# Patient Record
Sex: Male | Born: 1945
Health system: Southern US, Community
[De-identification: ages and names within clinical notes are randomized; demographics above are authoritative.]

## PROBLEM LIST (undated history)

## (undated) DIAGNOSIS — T7840XA Allergy, unspecified, initial encounter: Secondary | ICD-10-CM

## (undated) DIAGNOSIS — M1990 Unspecified osteoarthritis, unspecified site: Secondary | ICD-10-CM

## (undated) DIAGNOSIS — N183 Chronic kidney disease, stage 3 unspecified: Secondary | ICD-10-CM

## (undated) DIAGNOSIS — F32A Depression, unspecified: Secondary | ICD-10-CM

## (undated) DIAGNOSIS — F419 Anxiety disorder, unspecified: Secondary | ICD-10-CM

## (undated) DIAGNOSIS — L209 Atopic dermatitis, unspecified: Secondary | ICD-10-CM

## (undated) DIAGNOSIS — I10 Essential (primary) hypertension: Secondary | ICD-10-CM

## (undated) DIAGNOSIS — M359 Systemic involvement of connective tissue, unspecified: Secondary | ICD-10-CM

## (undated) DIAGNOSIS — H269 Unspecified cataract: Secondary | ICD-10-CM

## (undated) DIAGNOSIS — L309 Dermatitis, unspecified: Secondary | ICD-10-CM

## (undated) DIAGNOSIS — I219 Acute myocardial infarction, unspecified: Secondary | ICD-10-CM

## (undated) DIAGNOSIS — E119 Type 2 diabetes mellitus without complications: Secondary | ICD-10-CM

## (undated) DIAGNOSIS — D72819 Decreased white blood cell count, unspecified: Secondary | ICD-10-CM

## (undated) DIAGNOSIS — E785 Hyperlipidemia, unspecified: Secondary | ICD-10-CM

## (undated) DIAGNOSIS — R5383 Other fatigue: Secondary | ICD-10-CM

## (undated) DIAGNOSIS — R894 Abnormal immunological findings in specimens from other organs, systems and tissues: Secondary | ICD-10-CM

## (undated) DIAGNOSIS — L4050 Arthropathic psoriasis, unspecified: Secondary | ICD-10-CM

## (undated) DIAGNOSIS — B029 Zoster without complications: Secondary | ICD-10-CM

## (undated) DIAGNOSIS — N529 Male erectile dysfunction, unspecified: Secondary | ICD-10-CM

## (undated) DIAGNOSIS — I2510 Atherosclerotic heart disease of native coronary artery without angina pectoris: Secondary | ICD-10-CM

## (undated) DIAGNOSIS — K219 Gastro-esophageal reflux disease without esophagitis: Secondary | ICD-10-CM

## (undated) HISTORY — DX: Depression, unspecified: F32.A

## (undated) HISTORY — DX: Chronic kidney disease, stage 3 unspecified: N18.30

## (undated) HISTORY — DX: Acute myocardial infarction, unspecified: I21.9

## (undated) HISTORY — DX: Atopic dermatitis, unspecified: L20.9

## (undated) HISTORY — PX: COLONOSCOPY: SHX174

## (undated) HISTORY — DX: Zoster without complications: B02.9

## (undated) HISTORY — PX: UPPER GASTROINTESTINAL ENDOSCOPY: SHX188

## (undated) HISTORY — PX: TONSILLECTOMY: SUR1361

## (undated) HISTORY — DX: Type 2 diabetes mellitus without complications: E11.9

## (undated) HISTORY — DX: Other fatigue: R53.83

## (undated) HISTORY — DX: Abnormal immunological findings in specimens from other organs, systems and tissues: R89.4

## (undated) HISTORY — PX: TOOTH EXTRACTION: SUR596

## (undated) HISTORY — DX: Unspecified cataract: H26.9

## (undated) HISTORY — DX: Allergy, unspecified, initial encounter: T78.40XA

## (undated) HISTORY — DX: Essential (primary) hypertension: I10

## (undated) HISTORY — DX: Gastro-esophageal reflux disease without esophagitis: K21.9

## (undated) HISTORY — DX: Unspecified osteoarthritis, unspecified site: M19.90

## (undated) HISTORY — DX: Hyperlipidemia, unspecified: E78.5

## (undated) HISTORY — DX: Dermatitis, unspecified: L30.9

## (undated) HISTORY — DX: Arthropathic psoriasis, unspecified: L40.50

## (undated) HISTORY — DX: Decreased white blood cell count, unspecified: D72.819

## (undated) HISTORY — DX: Atherosclerotic heart disease of native coronary artery without angina pectoris: I25.10

## (undated) HISTORY — DX: Male erectile dysfunction, unspecified: N52.9

## (undated) HISTORY — PX: KNEE ARTHROSCOPY: SUR90

## (undated) HISTORY — DX: Anxiety disorder, unspecified: F41.9

---

## 2000-09-07 ENCOUNTER — Encounter: Payer: Self-pay | Admitting: Emergency Medicine

## 2000-09-07 ENCOUNTER — Emergency Department (HOSPITAL_COMMUNITY): Admission: EM | Admit: 2000-09-07 | Discharge: 2000-09-07 | Payer: Self-pay | Admitting: Emergency Medicine

## 2002-03-11 DIAGNOSIS — I219 Acute myocardial infarction, unspecified: Secondary | ICD-10-CM

## 2002-03-11 HISTORY — DX: Acute myocardial infarction, unspecified: I21.9

## 2002-06-07 ENCOUNTER — Emergency Department (HOSPITAL_COMMUNITY): Admission: EM | Admit: 2002-06-07 | Discharge: 2002-06-07 | Payer: Self-pay | Admitting: Emergency Medicine

## 2002-06-07 ENCOUNTER — Encounter: Payer: Self-pay | Admitting: Emergency Medicine

## 2002-06-09 ENCOUNTER — Inpatient Hospital Stay (HOSPITAL_COMMUNITY): Admission: RE | Admit: 2002-06-09 | Discharge: 2002-06-11 | Payer: Self-pay | Admitting: *Deleted

## 2002-06-13 ENCOUNTER — Inpatient Hospital Stay (HOSPITAL_COMMUNITY): Admission: EM | Admit: 2002-06-13 | Discharge: 2002-06-15 | Payer: Self-pay | Admitting: Emergency Medicine

## 2002-06-13 ENCOUNTER — Encounter: Payer: Self-pay | Admitting: Emergency Medicine

## 2002-06-25 ENCOUNTER — Encounter (HOSPITAL_COMMUNITY): Admission: RE | Admit: 2002-06-25 | Discharge: 2002-07-25 | Payer: Self-pay | Admitting: Cardiology

## 2003-09-13 ENCOUNTER — Encounter: Admission: RE | Admit: 2003-09-13 | Discharge: 2003-09-13 | Payer: Self-pay | Admitting: Family Medicine

## 2004-01-17 ENCOUNTER — Ambulatory Visit: Payer: Self-pay | Admitting: Family Medicine

## 2004-01-19 ENCOUNTER — Ambulatory Visit: Payer: Self-pay | Admitting: Family Medicine

## 2004-02-27 ENCOUNTER — Ambulatory Visit: Payer: Self-pay | Admitting: Family Medicine

## 2004-03-06 ENCOUNTER — Ambulatory Visit: Payer: Self-pay | Admitting: Oncology

## 2004-04-25 ENCOUNTER — Ambulatory Visit: Payer: Self-pay | Admitting: Infectious Diseases

## 2004-04-25 ENCOUNTER — Ambulatory Visit (HOSPITAL_COMMUNITY): Admission: RE | Admit: 2004-04-25 | Discharge: 2004-04-25 | Payer: Self-pay | Admitting: Infectious Diseases

## 2004-05-09 ENCOUNTER — Ambulatory Visit: Payer: Self-pay | Admitting: Infectious Diseases

## 2004-06-08 ENCOUNTER — Ambulatory Visit: Payer: Self-pay | Admitting: Family Medicine

## 2004-07-11 ENCOUNTER — Ambulatory Visit: Payer: Self-pay | Admitting: Infectious Diseases

## 2004-07-11 ENCOUNTER — Ambulatory Visit (HOSPITAL_COMMUNITY): Admission: RE | Admit: 2004-07-11 | Discharge: 2004-07-11 | Payer: Self-pay | Admitting: Infectious Diseases

## 2004-07-16 ENCOUNTER — Ambulatory Visit: Payer: Self-pay | Admitting: Oncology

## 2004-08-01 ENCOUNTER — Ambulatory Visit: Payer: Self-pay | Admitting: Infectious Diseases

## 2004-08-22 ENCOUNTER — Ambulatory Visit: Payer: Self-pay | Admitting: Family Medicine

## 2004-09-25 ENCOUNTER — Ambulatory Visit: Payer: Self-pay

## 2004-10-12 ENCOUNTER — Ambulatory Visit: Payer: Self-pay | Admitting: Cardiology

## 2004-10-12 ENCOUNTER — Ambulatory Visit: Payer: Self-pay | Admitting: Family Medicine

## 2004-10-19 ENCOUNTER — Ambulatory Visit: Payer: Self-pay | Admitting: Family Medicine

## 2004-11-01 ENCOUNTER — Ambulatory Visit: Payer: Self-pay | Admitting: Oncology

## 2005-03-13 ENCOUNTER — Ambulatory Visit: Payer: Self-pay | Admitting: Family Medicine

## 2005-04-22 ENCOUNTER — Ambulatory Visit: Payer: Self-pay | Admitting: Internal Medicine

## 2005-04-23 ENCOUNTER — Encounter (INDEPENDENT_AMBULATORY_CARE_PROVIDER_SITE_OTHER): Payer: Self-pay | Admitting: *Deleted

## 2005-04-23 ENCOUNTER — Ambulatory Visit: Payer: Self-pay | Admitting: Internal Medicine

## 2005-04-23 HISTORY — PX: ESOPHAGOGASTRODUODENOSCOPY: SHX1529

## 2005-05-20 ENCOUNTER — Ambulatory Visit: Payer: Self-pay | Admitting: Family Medicine

## 2005-10-01 ENCOUNTER — Ambulatory Visit: Payer: Self-pay | Admitting: Family Medicine

## 2005-10-08 ENCOUNTER — Ambulatory Visit: Payer: Self-pay | Admitting: Family Medicine

## 2005-10-09 ENCOUNTER — Ambulatory Visit: Payer: Self-pay | Admitting: Cardiology

## 2006-03-11 HISTORY — PX: POLYPECTOMY: SHX149

## 2006-10-03 ENCOUNTER — Ambulatory Visit: Payer: Self-pay | Admitting: Family Medicine

## 2006-10-03 LAB — CONVERTED CEMR LAB
ALT: 23 units/L (ref 0–53)
AST: 19 units/L (ref 0–37)
Albumin: 3.9 g/dL (ref 3.5–5.2)
Alkaline Phosphatase: 58 units/L (ref 39–117)
BUN: 11 mg/dL (ref 6–23)
Basophils Absolute: 0 10*3/uL (ref 0.0–0.1)
Basophils Relative: 0 % (ref 0.0–1.0)
Bilirubin Urine: NEGATIVE
Bilirubin, Direct: 0.1 mg/dL (ref 0.0–0.3)
CO2: 28 meq/L (ref 19–32)
Calcium: 9.2 mg/dL (ref 8.4–10.5)
Chloride: 105 meq/L (ref 96–112)
Cholesterol: 106 mg/dL (ref 0–200)
Creatinine, Ser: 1.1 mg/dL (ref 0.4–1.5)
Eosinophils Absolute: 0.3 10*3/uL (ref 0.0–0.6)
Eosinophils Relative: 7.1 % — ABNORMAL HIGH (ref 0.0–5.0)
GFR calc Af Amer: 88 mL/min
GFR calc non Af Amer: 72 mL/min
Glucose, Bld: 132 mg/dL — ABNORMAL HIGH (ref 70–99)
Glucose, Urine, Semiquant: NEGATIVE
HCT: 40.9 % (ref 39.0–52.0)
HDL: 26.9 mg/dL — ABNORMAL LOW (ref >39.0)
Hemoglobin: 14.6 g/dL (ref 13.0–17.0)
Ketones, urine, test strip: NEGATIVE
LDL Cholesterol: 61 mg/dL (ref 0–99)
Lymphocytes Relative: 11.4 % — ABNORMAL LOW (ref 12.0–46.0)
MCHC: 35.6 g/dL (ref 30.0–36.0)
MCV: 92.5 fL (ref 78.0–100.0)
Monocytes Absolute: 0.4 10*3/uL (ref 0.2–0.7)
Monocytes Relative: 8.9 % (ref 3.0–11.0)
Neutro Abs: 3.4 10*3/uL (ref 1.4–7.7)
Neutrophils Relative %: 72.6 % (ref 43.0–77.0)
Nitrite: NEGATIVE
PSA: 1.5 ng/mL (ref 0.10–4.00)
Platelets: 360 10*3/uL (ref 150–400)
Potassium: 4.2 meq/L (ref 3.5–5.1)
Protein, U semiquant: NEGATIVE
RBC: 4.42 M/uL (ref 4.22–5.81)
RDW: 11.9 % (ref 11.5–14.6)
Sodium: 138 meq/L (ref 135–145)
Specific Gravity, Urine: 1.02
TSH: 1.32 microintl units/mL (ref 0.35–5.50)
Total Bilirubin: 0.6 mg/dL (ref 0.3–1.2)
Total CHOL/HDL Ratio: 3.9
Total Protein: 6 g/dL (ref 6.0–8.3)
Triglycerides: 92 mg/dL (ref 0–149)
Urobilinogen, UA: 0.2
VLDL: 18 mg/dL (ref 0–40)
WBC Urine, dipstick: NEGATIVE
WBC: 4.6 10*3/uL (ref 4.5–10.5)
pH: 6

## 2006-10-06 DIAGNOSIS — E1139 Type 2 diabetes mellitus with other diabetic ophthalmic complication: Secondary | ICD-10-CM | POA: Insufficient documentation

## 2006-10-06 DIAGNOSIS — I152 Hypertension secondary to endocrine disorders: Secondary | ICD-10-CM | POA: Insufficient documentation

## 2006-10-06 DIAGNOSIS — E785 Hyperlipidemia, unspecified: Secondary | ICD-10-CM | POA: Insufficient documentation

## 2006-10-06 DIAGNOSIS — I10 Essential (primary) hypertension: Secondary | ICD-10-CM | POA: Insufficient documentation

## 2006-10-06 DIAGNOSIS — F411 Generalized anxiety disorder: Secondary | ICD-10-CM | POA: Insufficient documentation

## 2006-10-06 DIAGNOSIS — E1169 Type 2 diabetes mellitus with other specified complication: Secondary | ICD-10-CM | POA: Insufficient documentation

## 2006-10-06 DIAGNOSIS — I252 Old myocardial infarction: Secondary | ICD-10-CM | POA: Insufficient documentation

## 2006-10-06 DIAGNOSIS — I2510 Atherosclerotic heart disease of native coronary artery without angina pectoris: Secondary | ICD-10-CM | POA: Insufficient documentation

## 2006-10-10 ENCOUNTER — Telehealth: Payer: Self-pay | Admitting: Family Medicine

## 2006-10-21 ENCOUNTER — Encounter: Payer: Self-pay | Admitting: Family Medicine

## 2006-10-21 ENCOUNTER — Ambulatory Visit: Payer: Self-pay

## 2006-10-28 ENCOUNTER — Ambulatory Visit: Payer: Self-pay | Admitting: Cardiology

## 2006-11-24 ENCOUNTER — Ambulatory Visit: Payer: Self-pay | Admitting: Family Medicine

## 2006-11-24 DIAGNOSIS — L2089 Other atopic dermatitis: Secondary | ICD-10-CM | POA: Insufficient documentation

## 2006-11-24 DIAGNOSIS — J309 Allergic rhinitis, unspecified: Secondary | ICD-10-CM | POA: Insufficient documentation

## 2006-11-24 DIAGNOSIS — D72819 Decreased white blood cell count, unspecified: Secondary | ICD-10-CM | POA: Insufficient documentation

## 2007-10-28 ENCOUNTER — Ambulatory Visit: Payer: Self-pay | Admitting: Cardiology

## 2007-11-17 ENCOUNTER — Ambulatory Visit: Payer: Self-pay | Admitting: Family Medicine

## 2007-11-17 LAB — CONVERTED CEMR LAB
ALT: 25 units/L (ref 0–53)
AST: 22 units/L (ref 0–37)
Basophils Absolute: 0 10*3/uL (ref 0.0–0.1)
Basophils Relative: 0.1 % (ref 0.0–3.0)
Bilirubin Urine: NEGATIVE
Bilirubin, Direct: 0.1 mg/dL (ref 0.0–0.3)
Blood in Urine, dipstick: NEGATIVE
CO2: 28 meq/L (ref 19–32)
Chloride: 106 meq/L (ref 96–112)
Cholesterol: 117 mg/dL (ref 0–200)
Creatinine, Ser: 1.1 mg/dL (ref 0.4–1.5)
Eosinophils Absolute: 0.3 10*3/uL (ref 0.0–0.7)
GFR calc non Af Amer: 72 mL/min
Glucose, Urine, Semiquant: NEGATIVE
HDL: 31.5 mg/dL — ABNORMAL LOW (ref >39.0)
Ketones, urine, test strip: NEGATIVE
LDL Cholesterol: 68 mg/dL (ref 0–99)
Lymphocytes Relative: 12.5 % (ref 12.0–46.0)
MCHC: 35 g/dL (ref 30.0–36.0)
MCV: 95.6 fL (ref 78.0–100.0)
Neutrophils Relative %: 71.9 % (ref 43.0–77.0)
Nitrite: NEGATIVE
PSA: 1.26 ng/mL (ref 0.10–4.00)
Platelets: 392 10*3/uL (ref 150–400)
Protein, U semiquant: NEGATIVE
RDW: 11.8 % (ref 11.5–14.6)
Sodium: 139 meq/L (ref 135–145)
Specific Gravity, Urine: 1.02
TSH: 1.53 microintl units/mL (ref 0.35–5.50)
Total Bilirubin: 1 mg/dL (ref 0.3–1.2)
Triglycerides: 86 mg/dL (ref 0–149)
Urobilinogen, UA: 0.2
VLDL: 17 mg/dL (ref 0–40)
WBC Urine, dipstick: NEGATIVE
pH: 6

## 2007-11-26 ENCOUNTER — Ambulatory Visit: Payer: Self-pay | Admitting: Family Medicine

## 2007-11-26 DIAGNOSIS — M1990 Unspecified osteoarthritis, unspecified site: Secondary | ICD-10-CM | POA: Insufficient documentation

## 2007-11-26 DIAGNOSIS — F528 Other sexual dysfunction not due to a substance or known physiological condition: Secondary | ICD-10-CM | POA: Insufficient documentation

## 2007-11-30 ENCOUNTER — Telehealth: Payer: Self-pay | Admitting: Family Medicine

## 2008-06-28 ENCOUNTER — Ambulatory Visit: Payer: Self-pay | Admitting: Internal Medicine

## 2008-07-14 ENCOUNTER — Ambulatory Visit: Payer: Self-pay | Admitting: Internal Medicine

## 2008-08-29 ENCOUNTER — Telehealth: Payer: Self-pay | Admitting: Family Medicine

## 2008-09-08 ENCOUNTER — Telehealth: Payer: Self-pay | Admitting: Family Medicine

## 2008-09-09 ENCOUNTER — Telehealth: Payer: Self-pay | Admitting: *Deleted

## 2008-11-09 ENCOUNTER — Ambulatory Visit: Payer: Self-pay | Admitting: Family Medicine

## 2008-11-09 LAB — CONVERTED CEMR LAB
BUN: 17 mg/dL (ref 6–23)
Basophils Absolute: 0 10*3/uL (ref 0.0–0.1)
Bilirubin Urine: NEGATIVE
Bilirubin, Direct: 0.1 mg/dL (ref 0.0–0.3)
CO2: 27 meq/L (ref 19–32)
Calcium: 9.4 mg/dL (ref 8.4–10.5)
Chloride: 104 meq/L (ref 96–112)
Cholesterol: 108 mg/dL (ref 0–200)
Creatinine, Ser: 1 mg/dL (ref 0.4–1.5)
Eosinophils Absolute: 0.3 10*3/uL (ref 0.0–0.7)
HDL: 30.5 mg/dL — ABNORMAL LOW (ref >39.00)
LDL Cholesterol: 62 mg/dL (ref 0–99)
Leukocytes, UA: NEGATIVE
Lymphocytes Relative: 11.4 % — ABNORMAL LOW (ref 12.0–46.0)
MCHC: 35.5 g/dL (ref 30.0–36.0)
MCV: 93.4 fL (ref 78.0–100.0)
Monocytes Absolute: 0.5 10*3/uL (ref 0.1–1.0)
Neutrophils Relative %: 74.6 % (ref 43.0–77.0)
Nitrite: NEGATIVE
PSA: 1.5 ng/mL (ref 0.10–4.00)
Platelets: 435 10*3/uL — ABNORMAL HIGH (ref 150.0–400.0)
RDW: 11.8 % (ref 11.5–14.6)
Specific Gravity, Urine: 1.02 (ref 1.000–1.030)
Total Bilirubin: 0.7 mg/dL (ref 0.3–1.2)
Total Protein: 7.1 g/dL (ref 6.0–8.3)
Triglycerides: 79 mg/dL (ref 0.0–149.0)
pH: 6 (ref 5.0–8.0)

## 2008-11-29 ENCOUNTER — Ambulatory Visit: Payer: Self-pay | Admitting: Family Medicine

## 2008-11-30 DIAGNOSIS — R5381 Other malaise: Secondary | ICD-10-CM | POA: Insufficient documentation

## 2008-11-30 DIAGNOSIS — R5383 Other fatigue: Secondary | ICD-10-CM

## 2008-12-07 ENCOUNTER — Ambulatory Visit: Payer: Self-pay | Admitting: Cardiology

## 2009-01-03 ENCOUNTER — Ambulatory Visit: Payer: Self-pay | Admitting: Family Medicine

## 2009-01-03 LAB — CONVERTED CEMR LAB
Bilirubin Urine: NEGATIVE
Chloride: 104 meq/L (ref 96–112)
Creatinine, Ser: 1.2 mg/dL (ref 0.4–1.5)
Hemoglobin, Urine: NEGATIVE
Hgb A1c MFr Bld: 6 % (ref 4.6–6.5)
Leukocytes, UA: NEGATIVE
Nitrite: NEGATIVE
Sodium: 136 meq/L (ref 135–145)
pH: 6 (ref 5.0–8.0)

## 2009-01-11 ENCOUNTER — Ambulatory Visit: Payer: Self-pay | Admitting: Family Medicine

## 2009-04-05 ENCOUNTER — Ambulatory Visit: Payer: Self-pay | Admitting: Family Medicine

## 2009-04-05 LAB — CONVERTED CEMR LAB
BUN: 25 mg/dL — ABNORMAL HIGH (ref 6–23)
Calcium: 9.3 mg/dL (ref 8.4–10.5)
GFR calc non Af Amer: 79.93 mL/min (ref >60)
Glucose, Bld: 128 mg/dL — ABNORMAL HIGH (ref 70–99)
Sodium: 139 meq/L (ref 135–145)

## 2009-04-12 ENCOUNTER — Ambulatory Visit: Payer: Self-pay | Admitting: Family Medicine

## 2009-08-14 ENCOUNTER — Encounter: Payer: Self-pay | Admitting: Family Medicine

## 2009-11-07 ENCOUNTER — Ambulatory Visit: Payer: Self-pay | Admitting: Family Medicine

## 2009-11-07 ENCOUNTER — Telehealth: Payer: Self-pay | Admitting: Family Medicine

## 2009-11-20 ENCOUNTER — Ambulatory Visit: Payer: Self-pay | Admitting: Family Medicine

## 2009-12-07 ENCOUNTER — Ambulatory Visit: Payer: Self-pay | Admitting: Family Medicine

## 2009-12-07 LAB — CONVERTED CEMR LAB
AST: 25 units/L (ref 0–37)
Alkaline Phosphatase: 45 units/L (ref 39–117)
BUN: 26 mg/dL — ABNORMAL HIGH (ref 6–23)
Basophils Absolute: 0 10*3/uL (ref 0.0–0.1)
Bilirubin Urine: NEGATIVE
Calcium: 9.4 mg/dL (ref 8.4–10.5)
GFR calc non Af Amer: 62.81 mL/min (ref >60)
Glucose, Bld: 124 mg/dL — ABNORMAL HIGH (ref 70–99)
HDL: 34.7 mg/dL — ABNORMAL LOW (ref >39.00)
Hemoglobin, Urine: NEGATIVE
Ketones, ur: NEGATIVE mg/dL
Lymphocytes Relative: 10.2 % — ABNORMAL LOW (ref 12.0–46.0)
Lymphs Abs: 0.5 10*3/uL — ABNORMAL LOW (ref 0.7–4.0)
Monocytes Relative: 8.1 % (ref 3.0–12.0)
Nitrite: NEGATIVE
Platelets: 528 10*3/uL — ABNORMAL HIGH (ref 150.0–400.0)
RDW: 13.5 % (ref 11.5–14.6)
TSH: 1.06 microintl units/mL (ref 0.35–5.50)
Total Bilirubin: 0.8 mg/dL (ref 0.3–1.2)
Total Protein, Urine: NEGATIVE mg/dL
VLDL: 9.6 mg/dL (ref 0.0–40.0)

## 2009-12-14 ENCOUNTER — Encounter: Payer: Self-pay | Admitting: Cardiology

## 2009-12-14 ENCOUNTER — Ambulatory Visit: Payer: Self-pay | Admitting: Family Medicine

## 2009-12-14 ENCOUNTER — Encounter: Payer: Self-pay | Admitting: Family Medicine

## 2009-12-14 DIAGNOSIS — Z87438 Personal history of other diseases of male genital organs: Secondary | ICD-10-CM | POA: Insufficient documentation

## 2009-12-21 ENCOUNTER — Ambulatory Visit: Payer: Self-pay | Admitting: Cardiology

## 2009-12-25 ENCOUNTER — Telehealth (INDEPENDENT_AMBULATORY_CARE_PROVIDER_SITE_OTHER): Payer: Self-pay | Admitting: Radiology

## 2009-12-26 ENCOUNTER — Encounter: Payer: Self-pay | Admitting: Internal Medicine

## 2009-12-26 ENCOUNTER — Encounter (HOSPITAL_COMMUNITY)
Admission: RE | Admit: 2009-12-26 | Discharge: 2010-03-10 | Payer: Self-pay | Source: Home / Self Care | Attending: Cardiology | Admitting: Cardiology

## 2009-12-26 ENCOUNTER — Ambulatory Visit: Payer: Self-pay

## 2009-12-26 ENCOUNTER — Ambulatory Visit: Payer: Self-pay | Admitting: Internal Medicine

## 2010-01-12 ENCOUNTER — Ambulatory Visit: Payer: Self-pay | Admitting: Family Medicine

## 2010-04-10 NOTE — Assessment & Plan Note (Signed)
Summary: ROA X 3 MTHS / RS   Vital Signs:  Patient profile:   65 year old male Weight:      180 pounds Temp:     98.7 degrees F oral BP sitting:   120 / 80  (left arm) Cuff size:   regular  Vitals Entered By: Kern Reap CMA Duncan Dull) (April 12, 2009 8:32 AM)  Reason for Visit follow up office visit   Primary Care Provider:  Roderick Pee MD   History of Present Illness: Chase Anderson is a 65 year old, married male, nonsmoker, who comes in today for evaluation of 3 problems.  He has underlying type 2 diabetes, however, he's been able to control his blood sugar with diet and exercise.  His current weight is 180.  He exercises on a regular basis and avoids carbohydrates.  Fasting blood sugar 128 with a hemoglobin A1c of 6.0, which represents excellent control without medication.  Is osteoarthritis is flared up again.  Dr. wall.  Advised him to stop the Motrin.  He switch to Aleve one b.i.d. however, his arthritis got worse.  In the past.  We tried indomethacin 50 mg daily, however, they gave him severe abdominal pain, and nausea.  His eczema has flared up on his hands.  He uses a combination of triamcinolone with moisturizing cream.  But it doesn't seem to help.  In the past week, given a short course of prednisone however, since his joint pain is worse.  We discussed the various options.  Will get a consult from Dr. Dareen Piano rheumatology to see if there is something else we might do to help with his joint pain.  Allergies: 1)  ! Pcn 2)  ! Codeine 3)  ! * Rubber  Past History:  Past medical, surgical, family and social histories (including risk factors) reviewed for relevance to current acute and chronic problems.  Past Medical History: Reviewed history from 11/30/2008 and no changes required. MYOCARDIAL INFARCTION, HX OF (ICD-412) CORONARY ARTERY DISEASE (ICD-414.00) HYPERLIPIDEMIA (ICD-272.4) HYPERTENSION (ICD-401.9) FATIGUE, CHRONIC (ICD-780.79) ANXIETY  (ICD-300.00) DIABETES MELLITUS, TYPE II (ICD-250.00) OSTEOARTHRITIS (ICD-715.90) ERECTILE DYSFUNCTION (ICD-302.72) LEUKOPENIA, CHRONIC (ICD-288.50) DERMATITIS, OTHER ATOPIC (ICD-691.8) ALLERGIC RHINITIS (ICD-477.9)    Past Surgical History: Reviewed history from 11/30/2008 and no changes required. EGD-04/23/2005 Tonsillectomy Knee Arthroscopy (right)  Family History: Reviewed history from 10/06/2006 and no changes required. Family History Hypertension Fam hx COPD Fam hx CHF Fam hx Anxiety  Social History: Reviewed history from 11/30/2008 and no changes required. Married Former Smoker..quit 10/03..2ppd Alcohol use-no Drug use-no Regular exercise-yes  Review of Systems      See HPI  Physical Exam  General:  Well-developed,well-nourished,in no acute distress; alert,appropriate and cooperative throughout examination Skin:  2+ eczema involving the hands.  No cracks or fissures   Impression & Recommendations:  Problem # 1:  DIABETES MELLITUS, TYPE II (ICD-250.00) Assessment Improved  His updated medication list for this problem includes:    Aspirin 325 Mg Tabs (Aspirin) .Marland Kitchen... Take once daily  Orders: Prescription Created Electronically [phone])  Problem # 2:  OSTEOARTHRITIS (ICD-715.90) Assessment: Deteriorated  His updated medication list for this problem includes:    Aspirin 325 Mg Tabs (Aspirin) .Marland Kitchen... Take once daily    Aleve 220 Mg Tabs (Naproxen sodium) .Marland Kitchen... Take 2 tabs  two times a day    Darvocet-n 100 100-650 Mg Tabs (Propoxyphene n-apap) .Marland Kitchen... 1 or 2 three times a day as needed pain  Orders: Prescription Created Electronically [phone]) Rheumatology Referral (Rheumatology)  Problem # 3:  DERMATITIS, OTHER ATOPIC (ICD-691.8) Assessment: Unchanged  His updated medication list for this problem includes:    Triamcinolone Acetonide 0.1 % Crea (Triamcinolone acetonide) .Marland Kitchen... As needed    Fluocinonide 0.05 % Oint (Fluocinonide) .Marland Kitchen... Apply once daily as  needed  Orders: Prescription Created Electronically [phone])  Complete Medication List: 1)  Simvastatin 20 Mg Tabs (Simvastatin) .Marland Kitchen.. 1 at bedtime 2)  Fluconazole 200 Mg Tabs (Fluconazole) .... As needed 3)  Norvasc 10 Mg Tabs (Amlodipine besylate) .... Take 1 tablet by mouth every morning 4)  Bactrim Ds 800-160 Mg Tabs (Sulfamethoxazole-trimethoprim) .... One once daily prn 5)  Triamcinolone Acetonide 0.1 % Crea (Triamcinolone acetonide) .... As needed 6)  Nitroquick 0.3 Mg Subl (Nitroglycerin) .... Take one tab under tongue as needed 7)  Cleocin 300 Mg Caps (Clindamycin hcl) .... As needed 8)  Fluocinonide 0.05 % Oint (Fluocinonide) .... Apply once daily as needed 9)  Niacin 500 Mg Tabs (Niacin) .... Take one tab once daily 10)  Aspirin 325 Mg Tabs (Aspirin) .... Take once daily 11)  Ciprofloxacin Hcl 500 Mg Tabs (Ciprofloxacin hcl) .... Take 1 tablet by mouth two times a day 12)  Levitra 20 Mg Tabs (Vardenafil hcl) .... Uad 13)  Aleve 220 Mg Tabs (Naproxen sodium) .... Take 2 tabs  two times a day 14)  Darvocet-n 100 100-650 Mg Tabs (Propoxyphene n-apap) .Marland Kitchen.. 1 or 2 three times a day as needed pain  Patient Instructions: 1)  See your eye doctor yearly to check for diabetic eye damage. 2)  call Dr. Dareen Piano at [phone] be sure to mention that I referred to.  Have his nurse call Fleet Contras my nurse, and we will forward a copy of all your medical records

## 2010-04-10 NOTE — Assessment & Plan Note (Signed)
Summary: 2 WEEKS FUP//CCM---PT [facility] // RS   Vital Signs:  Patient profile:   65 year old male Weight:      182 pounds Temp:     98.1 degrees F oral BP sitting:   110 / 68  (right arm) Cuff size:   regular  Vitals Entered By: Kathrynn Speed CMA (November 20, 2009 1:38 PM) CC: 2 wk fu BS, has shinges, src Is Patient Diabetic? Yes   Primary Care Provider:  Roderick Pee MD  CC:  2 wk fu BS, has shinges, and src.  History of Present Illness: Chase Anderson is a 65 year old male, who comes in today for follow-up of diabetes, and a new problem of shingles.  We put him on metformin 500 mg b.i.d. on August 30.  Fasting blood sugar now in the 110, 120 range.  No hypoglycemia.  He is also on a strict diet and walks daily.  He finished his prednisone about 6 days ago.  Blood sugar has not dropped since he stopped the prednisone.  He also states he says shingles.  This is his third episode.  The first was his face.  The second right leg now.  His right chest.  It's been present for about two weeks.  He does have the issue with underlying leukopenia etiology ?  Preventive Screening-Counseling & Management  Alcohol-Tobacco     Smoking Status: quit  Current Medications (verified): 1)  Simvastatin 20 Mg  Tabs (Simvastatin) .Marland Kitchen.. 1 At Bedtime 2)  Fluconazole 200 Mg Tabs (Fluconazole) .... As Needed 3)  Norvasc 10 Mg  Tabs (Amlodipine Besylate) .... Take 1 Tablet By Mouth Every Morning 4)  Bactrim Ds 800-160 Mg  Tabs (Sulfamethoxazole-Trimethoprim) .... One Once Daily Prn 5)  Triamcinolone Acetonide 0.1 % Crea (Triamcinolone Acetonide) .... As Needed 6)  Nitroquick 0.3 Mg Subl (Nitroglycerin) .... Take One Tab Under Tongue As Needed 7)  Fluocinonide 0.05 % Oint (Fluocinonide) .... Apply Once Daily As Needed 8)  Niacin 500 Mg Tabs (Niacin) .... Take One Tab Once Daily 9)  Aspirin 325 Mg Tabs (Aspirin) .... Take Once Daily 10)  Levitra 20 Mg Tabs (Vardenafil Hcl) .... Uad 11)  Methotrexate 2.5 Mg  Tabs (Methotrexate Sodium) .... Take 6 Tabs Once Weekly 12)  Folic Acid 1 Mg Tabs (Folic Acid) .... Once Daily 13)  Metformin Hcl 500 Mg Tabs (Metformin Hcl) .... Take 1 Tablet By Mouth Two Times A Day 14)  Bd Ultra-Fine Lancets  Misc (Lancets) .... Use Once Daily For Glucose Control  Allergies (verified): 1)  ! Pcn 2)  ! Codeine 3)  ! * Rubber  Social History: Reviewed history from 11/30/2008 and no changes required. Married Former Smoker..quit 10/03..2ppd Alcohol use-no Drug use-no Regular exercise-yes  Review of Systems      See HPI  Physical Exam  General:  Well-developed,well-nourished,in no acute distress; alert,appropriate and cooperative throughout examination Skin:  diffuse vesicular rash on right chest wall, extending to the right back   Problems:  Medical Problems Added: 1)  Dx of Herpes Zoster Nos  (ICD-053.9)  Impression & Recommendations:  Problem # 1:  HERPES ZOSTER NOS (ICD-053.9) Assessment New  Problem # 2:  DIABETES MELLITUS, TYPE II (ICD-250.00) Assessment: Improved  His updated medication list for this problem includes:    Aspirin 325 Mg Tabs (Aspirin) .Marland Kitchen... Take once daily    Metformin Hcl 500 Mg Tabs (Metformin hcl) .Marland Kitchen... Take 1 tablet by mouth two times a day  Complete Medication List: 1)  Simvastatin 20  Mg Tabs (Simvastatin) .Marland Kitchen.. 1 at bedtime 2)  Fluconazole 200 Mg Tabs (Fluconazole) .... As needed 3)  Norvasc 10 Mg Tabs (Amlodipine besylate) .... Take 1 tablet by mouth every morning 4)  Bactrim Ds 800-160 Mg Tabs (Sulfamethoxazole-trimethoprim) .... One once daily prn 5)  Triamcinolone Acetonide 0.1 % Crea (Triamcinolone acetonide) .... As needed 6)  Nitroquick 0.3 Mg Subl (Nitroglycerin) .... Take one tab under tongue as needed 7)  Fluocinonide 0.05 % Oint (Fluocinonide) .... Apply once daily as needed 8)  Niacin 500 Mg Tabs (Niacin) .... Take one tab once daily 9)  Aspirin 325 Mg Tabs (Aspirin) .... Take once daily 10)  Levitra 20 Mg  Tabs (Vardenafil hcl) .... Uad 11)  Methotrexate 2.5 Mg Tabs (Methotrexate sodium) .... Take 6 tabs once weekly 12)  Folic Acid 1 Mg Tabs (Folic acid) .... Once daily 13)  Metformin Hcl 500 Mg Tabs (Metformin hcl) .... Take 1 tablet by mouth two times a day 14)  Bd Ultra-fine Lancets Misc (Lancets) .... Use once daily for glucose control 15)  Accu-chek Aviva Strp (Glucose blood) .... Use once daily for glucose readings 16)  Zovirax 400 Mg Tabs (Acyclovir) .... Take 1 tablet by mouth three times a day  Other Orders: Prescription Created Electronically [id number])  Patient Instructions: 1)  continue the metformin 500 mg twice daily and check a fasting blood sugar daily in the morning.  Return in 3 months for follow-up 2)  BMP prior to visit, ICD-9: 3)  HbgA1C prior to visit, ICD-9:.....250.00 4)  Begin acyclovir 600 mg 3 times a day until the rash is gone Prescriptions: ZOVIRAX 400 MG TABS (ACYCLOVIR) Take 1 tablet by mouth three times a day  #50 x 1   Entered and Authorized by:   Roderick Pee MD   Signed by:   Roderick Pee MD on 11/20/2009   Method used:   Electronically to        [facility] [address] [phone]* (retail)       [address] [place], [place]  [address]       Ph: [phone]       Fax: [phone]   RxID:   [id number] ACCU-CHEK AVIVA  STRP (GLUCOSE BLOOD) use once daily for glucose readings  #100 x 3   Entered by:   Kern Reap CMA (AAMA)   Authorized by:   Roderick Pee MD   Signed by:   Kern Reap CMA (AAMA) on 11/20/2009   Method used:   Electronically to        [facility] [phone]* (retail)       [address] [place]  [address]       Ph: [phone]       Fax: [phone]   RxID:   [id number]

## 2010-04-10 NOTE — Miscellaneous (Signed)
Summary: dm eye exam  Clinical Lists Changes  Observations: Added new observation of EYES COMMENT: 08/2010 (08/14/2009 12:45) Added new observation of EYE EXAM BY: cotter (08/14/2009 12:45) Added new observation of DMEYEEXMRES: mild retinopathy (08/14/2009 12:45) Added new observation of DIAB EYE EX: mild retinopathy (08/14/2009 12:45)       Diabetes Management History:      He says that he is exercising.    Diabetes Management Exam:    Eye Exam:       Eye Exam done elsewhere          Date: 08/14/2009          Results: mild retinopathy          Done by: cotter

## 2010-04-10 NOTE — Progress Notes (Signed)
Summary: NUC PRE-PROCEDURE  Phone Note Outgoing Call   Call placed by: Domenic Polite, CNMT,  December 25, 2009 10:04 AM Call placed to: Patient Summary of Call: Reviewed information on My[employer] Information Sheet (see scanned document for further details).  Spoke with patient.  Initial call taken by: Domenic Polite, CNMT,  December 25, 2009 10:04 AM     Nuclear Med Background Indications for Stress Test: Evaluation for Ischemia, Abnormal EKG   History: Abnormal EKG, Heart Catheterization, Myocardial Infarction, Myocardial Perfusion Study  History Comments: '04 heart cath - INF. MI , '08 MPI - IFEROSEPTAL THINING  Symptoms: Fatigue    Nuclear Pre-Procedure Cardiac Risk Factors: History of Smoking, Hypertension, Lipids, NIDDM Height (in): 70.5

## 2010-04-10 NOTE — Assessment & Plan Note (Signed)
Summary: elevated glucose - rv   Vital Signs:  Patient profile:   65 year old male Height:      70.5 inches Weight:      180 pounds BMI:     25.55 Temp:     98.1 degrees F oral BP sitting:   120 / 74  (left arm) Cuff size:   regular  Vitals Entered By: Kern Reap CMA Duncan Dull) (November 07, 2009 2:14 PM) CC: follow-up visit   Primary Care Provider:  Roderick Pee MD  CC:  follow-up visit.  History of Present Illness: densel is a 65 year old male, who has been diagnosed by his rheumatologist to have rheumatoid arthritis.  Is currently on methotrexate 2.5-mg tabs 6 or week and a prednisone 2.5 mg b.i.d..  Lab work the other day in the rheumatologist office showed random blood sugar to be 400.  Blood sugar today 223.  He said no fever, chills, weight loss.  He said a history of glucose intolerance in the past, however, he's been able to control with diet and exercise.  , blood sugar here January  2011 was 128  with an A1c of 6.0%  Allergies: 1)  ! Pcn 2)  ! Codeine 3)  ! * Rubber  Past History:  Past medical, surgical, family and social histories (including risk factors) reviewed for relevance to current acute and chronic problems.  Past Medical History: Reviewed history from 11/30/2008 and no changes required. MYOCARDIAL INFARCTION, HX OF (ICD-412) CORONARY ARTERY DISEASE (ICD-414.00) HYPERLIPIDEMIA (ICD-272.4) HYPERTENSION (ICD-401.9) FATIGUE, CHRONIC (ICD-780.79) ANXIETY (ICD-300.00) DIABETES MELLITUS, TYPE II (ICD-250.00) OSTEOARTHRITIS (ICD-715.90) ERECTILE DYSFUNCTION (ICD-302.72) LEUKOPENIA, CHRONIC (ICD-288.50) DERMATITIS, OTHER ATOPIC (ICD-691.8) ALLERGIC RHINITIS (ICD-477.9)    Past Surgical History: Reviewed history from 11/30/2008 and no changes required. EGD-04/23/2005 Tonsillectomy Knee Arthroscopy (right)  Family History: Reviewed history from 10/06/2006 and no changes required. Family History Hypertension Fam hx COPD Fam hx CHF Fam hx  Anxiety  Social History: Reviewed history from 11/30/2008 and no changes required. Married Former Smoker..quit 10/03..2ppd Alcohol use-no Drug use-no Regular exercise-yes  Review of Systems      See HPI  Physical Exam  General:  Well-developed,well-nourished,in no acute distress; alert,appropriate and cooperative throughout examination   Impression & Recommendations:  Problem # 1:  DIABETES MELLITUS, TYPE II (ICD-250.00) Assessment Deteriorated  His updated medication list for this problem includes:    Aspirin 325 Mg Tabs (Aspirin) .Marland Kitchen... Take once daily    Metformin Hcl 500 Mg Tabs (Metformin hcl) .Marland Kitchen... Take 1 tablet by mouth two times a day  Orders: Glucose, (CBG) ([address])  Complete Medication List: 1)  Simvastatin 20 Mg Tabs (Simvastatin) .Marland Kitchen.. 1 at bedtime 2)  Fluconazole 200 Mg Tabs (Fluconazole) .... As needed 3)  Norvasc 10 Mg Tabs (Amlodipine besylate) .... Take 1 tablet by mouth every morning 4)  Bactrim Ds 800-160 Mg Tabs (Sulfamethoxazole-trimethoprim) .... One once daily prn 5)  Triamcinolone Acetonide 0.1 % Crea (Triamcinolone acetonide) .... As needed 6)  Nitroquick 0.3 Mg Subl (Nitroglycerin) .... Take one tab under tongue as needed 7)  Fluocinonide 0.05 % Oint (Fluocinonide) .... Apply once daily as needed 8)  Niacin 500 Mg Tabs (Niacin) .... Take one tab once daily 9)  Aspirin 325 Mg Tabs (Aspirin) .... Take once daily 10)  Levitra 20 Mg Tabs (Vardenafil hcl) .... Uad 11)  Methotrexate 2.5 Mg Tabs (Methotrexate sodium) .... Take 6 tabs once weekly 12)  Prednisone 5 Mg Tabs (Prednisone) .... Use as directed 13)  Folic Acid 1 Mg Tabs (  Folic acid) .... Once daily 14)  Metformin Hcl 500 Mg Tabs (Metformin hcl) .... Take 1 tablet by mouth two times a day 15)  Bd Ultra-fine Lancets Misc (Lancets) .... Use once daily for glucose control  Patient Instructions: 1)  check a fasting blood sugar daily in the morning and begin Glucophage 500 mg before breakfast and  500 mg before evening meal.  If your blood sugar drops below 60 call.........Marland Kitchen return in two weeks for follow-up with the data Prescriptions: BD ULTRA-FINE LANCETS  MISC (LANCETS) use once daily for glucose control  #100 x 3   Entered by:   Kern Reap CMA (AAMA)   Authorized by:   Roderick Pee MD   Signed by:   Kern Reap CMA (AAMA) on 11/07/2009   Method used:   Electronically to        [facility] [address] [phone]* (retail)       [address] [place], [place]  [address]       Ph: [phone]       Fax: [phone]   RxID:   [id number] METFORMIN HCL 500 MG TABS (METFORMIN HCL) Take 1 tablet by mouth two times a day  #200 x 3   Entered and Authorized by:   Roderick Pee MD   Signed by:   Roderick Pee MD on 11/07/2009   Method used:   Electronically to        [facility] [address] [phone]* (retail)       [address], [place]  [address]       Ph: [phone]       Fax: [phone]   RxID:   [id number]

## 2010-04-10 NOTE — Assessment & Plan Note (Signed)
Summary: Cardiology Nuclear Testing  Nuclear Med Background Indications for Stress Test: Evaluation for Ischemia, Abnormal EKG   History: Abnormal EKG, Heart Catheterization, Myocardial Infarction, Myocardial Perfusion Study  History Comments: '04 heart cath - INF. MI , '08 MPI - IFEROSEPTAL THINING  Symptoms: Chest Tightness, Fatigue, Palpitations    Nuclear Pre-Procedure Cardiac Risk Factors: Family History - CAD, History of Smoking, Hypertension, Lipids, NIDDM Caffeine/Decaff Intake: none NPO After: 7:30 AM Lungs: clear IV 0.9% NS with Angio Cath: 22g     IV Site: R Hand IV Started by: Cathlyn Parsons, RN Chest Size (in) 44     Height (in): 70.5 Weight (lb): 178 BMI: 25.27  Nuclear Med Study 1 or 2 day study:  1 day     Stress Test Type:  Stress Reading MD:  Arvilla Meres, MD     Referring MD:  Wall Resting Radionuclide:  Technetium 88m Tetrofosmin     Resting Radionuclide Dose:  10.8 mCi  Stress Radionuclide:  Technetium 20m Tetrofosmin     Stress Radionuclide Dose:  33 mCi   Stress Protocol Exercise Time (min):  7:15 min     Max HR:  134 bpm     Predicted Max HR:  156 bpm  Max Systolic BP: 170 mm Hg     Percent Max HR:  85.90 %     METS: 8.9 Rate Pressure Product:  95638    Stress Test Technologist:  Milana Na, EMT-P     Nuclear Technologist:  Domenic Polite, CNMT  Rest Procedure  Myocardial perfusion imaging was performed at rest 45 minutes following the intravenous administration of Technetium 74m Tetrofosmin.  Stress Procedure  The patient exercised for 7:15. The patient stopped due to fatigue and denied any chest pain.  There were no significant ST-T wave changes and rare pvcs.  Technetium 2m Tetrofosmin was injected at peak exercise and myocardial perfusion imaging was performed after a brief delay.  QPS Raw Data Images:  Normal; no motion artifact; normal heart/lung ratio. Stress Images:  Mildly decreased uptake in the inferior and inferospetal  walls. Rest Images:  Mildly decreased uptake in the inferior and inferospetal walls. Subtraction (SDS):  Previous mild inferior and inferoseptal infarct. No ischemia. Transient Ischemic Dilatation:  1.07  (Normal <1.22)  Lung/Heart Ratio:  .33  (Normal <0.45)  Quantitative Gated Spect Images QGS EDV:  133 ml QGS ESV:  56 ml QGS EF:  59 % QGS cine images:  Septal hypokinesis.  Findings Low risk nuclear study      Overall Impression  Exercise Capacity: Fair exercise capacity. BP Response: Normal blood pressure response. Clinical Symptoms: No chest pain ECG Impression: No significant ST segment change suggestive of ischemia. Overall Impression: Low risk stress nuclear study. Overall Impression Comments: Previous mild inferior and inferoseptal infarct. No ischemia.  Appended Document: Cardiology Nuclear Testing stable, no change in treatment.  Appended Document: Cardiology Nuclear Testing Pts' wife is aware. [name] RN

## 2010-04-10 NOTE — Progress Notes (Signed)
Summary: BS 400  Phone Note Call from Patient   Caller: Patient Call For: Roderick Pee MD Reason for Call: Acute Illness Summary of Call: Pt saw a Rheumatologist, and his BS was 400.  Asking Dr. Tawanna Cooler for instructions? [phone] Initial call taken by: Lynann Beaver CMA,  November 07, 2009 10:09 AM  Follow-up for Phone Call        come in today now Follow-up by: Roderick Pee MD,  November 07, 2009 1:07 PM  Additional Follow-up for Phone Call Additional follow up Details #1::        Phone Call Completed Additional Follow-up by: Kern Reap CMA Duncan Dull),  November 07, 2009 1:19 PM

## 2010-04-10 NOTE — Assessment & Plan Note (Signed)
Summary: 1 month rov./njr   Vital Signs:  Patient profile:   65 year old male Weight:      185 pounds Temp:     98.4 degrees F oral BP sitting:   124 / 78  (left arm) Cuff size:   regular  Vitals Entered By: Kern Reap CMA Duncan Dull) (January 12, 2010 8:54 AM) CC: follow-up visit   Primary Care Provider:  Roderick Pee MD  CC:  follow-up visit.  History of Present Illness: Chase Anderson is a 65 year old male, who comes back today for follow-up of diabetes.  We saw him on Tobis 6 with an elevated blood sugar and A1c.  We increased his metformin to 1000 mg prior to breakfast and 500 mg prior to his evening meal.  He comes back today with fasting blood sugars all in the 120 range.  No hypoglycemia.  Cardiac evaluation including nuclear stress test, normal.  Recent infectious disease consult with Dr. Lurlean Leyden at [facility].  No changes  Allergies: 1)  ! Pcn 2)  ! Codeine 3)  ! * Rubber  Past History:  Past medical, surgical, family and social histories (including risk factors) reviewed for relevance to current acute and chronic problems.  Past Medical History: Reviewed history from 11/30/2008 and no changes required. MYOCARDIAL INFARCTION, HX OF (ICD-412) CORONARY ARTERY DISEASE (ICD-414.00) HYPERLIPIDEMIA (ICD-272.4) HYPERTENSION (ICD-401.9) FATIGUE, CHRONIC (ICD-780.79) ANXIETY (ICD-300.00) DIABETES MELLITUS, TYPE II (ICD-250.00) OSTEOARTHRITIS (ICD-715.90) ERECTILE DYSFUNCTION (ICD-302.72) LEUKOPENIA, CHRONIC (ICD-288.50) DERMATITIS, OTHER ATOPIC (ICD-691.8) ALLERGIC RHINITIS (ICD-477.9)    Past Surgical History: Reviewed history from 11/30/2008 and no changes required. EGD-04/23/2005 Tonsillectomy Knee Arthroscopy (right)  Family History: Reviewed history from 10/06/2006 and no changes required. Family History Hypertension Fam hx COPD Fam hx CHF Fam hx Anxiety  Social History: Reviewed history from 11/30/2008 and no changes required. Married Former  Smoker..quit 10/03..2ppd Alcohol use-no Drug use-no Regular exercise-yes  Review of Systems      See HPI  Physical Exam  General:  Well-developed,well-nourished,in no acute distress; alert,appropriate and cooperative throughout examination   Impression & Recommendations:  Problem # 1:  DIABETES MELLITUS, TYPE II (ICD-250.00) Assessment Improved  His updated medication list for this problem includes:    Aspirin 325 Mg Tabs (Aspirin) .Marland Kitchen... Take once daily    Metformin Hcl 500 Mg Tabs (Metformin hcl) .Marland Kitchen... 1 < pm meal    Metformin Hcl 1000 Mg Tabs (Metformin hcl) .Marland Kitchen... Take 1 tablet by mouth every morning < bfk  Complete Medication List: 1)  Simvastatin 20 Mg Tabs (Simvastatin) .Marland Kitchen.. 1 at bedtime 2)  Fluconazole 200 Mg Tabs (Fluconazole) .... As needed 3)  Norvasc 10 Mg Tabs (Amlodipine besylate) .... Take 1 tablet by mouth every morning 4)  Bactrim Ds 800-160 Mg Tabs (Sulfamethoxazole-trimethoprim) .... One once daily prn 5)  Triamcinolone Acetonide 0.1 % Crea (Triamcinolone acetonide) .... As needed 6)  Nitroquick 0.3 Mg Subl (Nitroglycerin) .... Take one tab under tongue as needed 7)  Fluocinonide 0.05 % Oint (Fluocinonide) .... Apply once daily as needed 8)  Niacin 500 Mg Tabs (Niacin) .... Take one tab once daily 9)  Aspirin 325 Mg Tabs (Aspirin) .... Take once daily 10)  Methotrexate 2.5 Mg Tabs (Methotrexate sodium) .... Take 6 tabs once weekly 11)  Folic Acid 1 Mg Tabs (Folic acid) .... Once daily 12)  Metformin Hcl 500 Mg Tabs (Metformin hcl) .Marland Kitchen.. 1 < pm meal 13)  Bd Ultra-fine Lancets Misc (Lancets) .... Use once daily for glucose control 14)  Accu-chek Aviva Strp (Glucose  blood) .... Use once daily for glucose readings 15)  Cialis 10 Mg Tabs (Tadalafil) .... Use as directed 16)  Metformin Hcl 1000 Mg Tabs (Metformin hcl) .... Take 1 tablet by mouth every morning < bfk  Patient Instructions: 1)  Please schedule a follow-up appointment in 3 months. 2)  BMP prior to  visit, ICD-9: 3)  HbgA1C prior to visit, ICD-9:....250.00   Orders Added: 1)  Est. Patient Level III [16109]

## 2010-04-10 NOTE — Assessment & Plan Note (Signed)
Summary: per check out/sf   Visit Type:  rov Primary Provider:  Roderick Pee MD  CC:  edema/ankles....denies any other complaints today.  History of Present Illness: Chase Anderson returns today for evaluation and management of his coronary disease. He having no symptoms of angina or ischemic equivalence. He denies any symptoms of an MI since her last visit. His electrocardiogram at Dr. Nelida Meuse office shows ST segment elevation in 3 and aVF with substantial Q waves. This is significantly different than previous EKG.    Current Medications (verified): 1)  Simvastatin 20 Mg  Tabs (Simvastatin) .Marland Kitchen.. 1 At Bedtime 2)  Fluconazole 200 Mg Tabs (Fluconazole) .... As Needed 3)  Norvasc 10 Mg  Tabs (Amlodipine Besylate) .... Take 1 Tablet By Mouth Every Morning 4)  Bactrim Ds 800-160 Mg  Tabs (Sulfamethoxazole-Trimethoprim) .... One Once Daily Prn 5)  Triamcinolone Acetonide 0.1 % Crea (Triamcinolone Acetonide) .... As Needed 6)  Nitroquick 0.3 Mg Subl (Nitroglycerin) .... Take One Tab Under Tongue As Needed 7)  Fluocinonide 0.05 % Oint (Fluocinonide) .... Apply Once Daily As Needed 8)  Niacin 500 Mg Tabs (Niacin) .... Take One Tab Once Daily 9)  Aspirin 325 Mg Tabs (Aspirin) .... Take Once Daily 10)  Methotrexate 2.5 Mg Tabs (Methotrexate Sodium) .... Take 6 Tabs Once Weekly 11)  Folic Acid 1 Mg Tabs (Folic Acid) .... Once Daily 12)  Metformin Hcl 500 Mg Tabs (Metformin Hcl) .Marland Kitchen.. 1 < Pm Meal 13)  Bd Ultra-Fine Lancets  Misc (Lancets) .... Use Once Daily For Glucose Control 14)  Accu-Chek Aviva  Strp (Glucose Blood) .... Use Once Daily For Glucose Readings 15)  Cialis 10 Mg Tabs (Tadalafil) .... Use As Directed 16)  Metformin Hcl 1000 Mg Tabs (Metformin Hcl) .... Take 1 Tablet By Mouth Every Morning < Bfk  Allergies: 1)  ! Pcn 2)  ! Codeine 3)  ! * Rubber  Past History:  Past Medical History: Last updated: 11/30/2008 MYOCARDIAL INFARCTION, HX OF (ICD-412) CORONARY ARTERY DISEASE  (ICD-414.00) HYPERLIPIDEMIA (ICD-272.4) HYPERTENSION (ICD-401.9) FATIGUE, CHRONIC (ICD-780.79) ANXIETY (ICD-300.00) DIABETES MELLITUS, TYPE II (ICD-250.00) OSTEOARTHRITIS (ICD-715.90) ERECTILE DYSFUNCTION (ICD-302.72) LEUKOPENIA, CHRONIC (ICD-288.50) DERMATITIS, OTHER ATOPIC (ICD-691.8) ALLERGIC RHINITIS (ICD-477.9)    Past Surgical History: Last updated: 11/30/2008 EGD-04/23/2005 Tonsillectomy Knee Arthroscopy (right)  Family History: Last updated: 10/06/2006 Family History Hypertension Fam hx COPD Fam hx CHF Fam hx Anxiety  Social History: Last updated: 11/30/2008 Married Former Smoker..quit 10/03..2ppd Alcohol use-no Drug use-no Regular exercise-yes  Risk Factors: Exercise: yes (10/06/2006)  Risk Factors: Smoking Status: quit (11/20/2009)  Review of Systems       negative other than history of present illness  Vital Signs:  Patient profile:   65 year old male Height:      70.5 inches Weight:      181.12 pounds BMI:     25.71 Pulse rate:   72 / minute Pulse rhythm:   regular BP sitting:   116 / 72  (left arm) Cuff size:   large  Vitals Entered By: Danielle Rankin, CMA (December 21, 2009 9:16 AM)  Physical Exam  General:  Well developed, well nourished, in no acute distress. Eyes:  PERRLA/EOM intact; conjunctiva and lids normal. Neck:  Neck supple, no JVD. No masses, thyromegaly or abnormal cervical nodes. Chest Artavia Jeanlouis:  no deformities or breast masses noted Lungs:  Clear bilaterally to auscultation and percussion. Heart:  PMI not displaced, normal S1-S2, regular rate and rhythm, soft systolic murmur along left sternal border. Carotid upstrokes equal bilaterally without  bruits Abdomen:  soft, good bowel sounds, no bruits Msk:  decreased ROM.   Pulses:  pulses normal in all 4 extremities Extremities:  trace left pedal edema and trace right pedal edema.   Neurologic:  Alert and oriented x 3. Skin:  Intact without lesions or rashes. Psych:  Normal  affect.   EKG  Procedure date:  12/24/2009  Findings:      normal sinus rhythm, ST elevation in 3 and aVF with Q waves.  Impression & Recommendations:  Problem # 1:  CORONARY ARTERY DISEASE (ICD-414.00)  He has changes inferiorly that are more pronounced than in the past. We'll obtain a exercise Myoview to check for stability. Again, he is asymptomatic. His updated medication list for this problem includes:    Norvasc 10 Mg Tabs (Amlodipine besylate) .Marland Kitchen... Take 1 tablet by mouth every morning    Nitroquick 0.3 Mg Subl (Nitroglycerin) .Marland Kitchen... Take one tab under tongue as needed    Aspirin 325 Mg Tabs (Aspirin) .Marland Kitchen... Take once daily  Problem # 2:  MYOCARDIAL INFARCTION, HX OF (ICD-412) Assessment: Unchanged  His updated medication list for this problem includes:    Norvasc 10 Mg Tabs (Amlodipine besylate) .Marland Kitchen... Take 1 tablet by mouth every morning    Nitroquick 0.3 Mg Subl (Nitroglycerin) .Marland Kitchen... Take one tab under tongue as needed    Aspirin 325 Mg Tabs (Aspirin) .Marland Kitchen... Take once daily  Problem # 3:  HYPERLIPIDEMIA (ICD-272.4) Assessment: Improved  His updated medication list for this problem includes:    Simvastatin 20 Mg Tabs (Simvastatin) .Marland Kitchen... 1 at bedtime    Niacin 500 Mg Tabs (Niacin) .Marland Kitchen... Take one tab once daily  Problem # 4:  HYPERTENSION (ICD-401.9)  His updated medication list for this problem includes:    Norvasc 10 Mg Tabs (Amlodipine besylate) .Marland Kitchen... Take 1 tablet by mouth every morning    Aspirin 325 Mg Tabs (Aspirin) .Marland Kitchen... Take once daily  Problem # 5:  DIABETES MELLITUS, TYPE II (ICD-250.00) Assessment: Unchanged  His updated medication list for this problem includes:    Aspirin 325 Mg Tabs (Aspirin) .Marland Kitchen... Take once daily    Metformin Hcl 500 Mg Tabs (Metformin hcl) .Marland Kitchen... 1 < pm meal    Metformin Hcl 1000 Mg Tabs (Metformin hcl) .Marland Kitchen... Take 1 tablet by mouth every morning < bfk  Other Orders: Nuclear Stress Test (Nuc Stress Test)  Clinical Reports  Reviewed:  Cardiac Cath:  06/09/2002: Cardiac Cath Findings:   LV:  Normal dimensions.  Left ventricular function is well preserved.     Ejection fraction of greater than 65%.  There is severe hypokinesis of     the distal inferior Mahamed Zalewski.  No mitral regurgitation noted.   HEMODYNAMICS:  1. LV pressure is 130/10.  2. Aortic is 130/80.  3. LVEDP equals 15.   ASSESSMENT AND PLAN:  The patient is a 65 year old gentleman with mild  coronary artery disease who presents with aborted inferior Deo Mehringer myocardial  infarction.  He does have Harlan Ervine motion abnormality of the distal inferior  Sena Hoopingarner.  This may represent ischemia from the distal posterior ventricular  branch of the right coronary artery which is a small caliber vessel and does  exhibit competitive flow.  No critical stenosis is noted in the body of the  right coronary artery.  Continued medical therapy will be pursued, and  aggressive risk factor modification initiated.  Veneda Melter, M.D. [facility]  Nuclear Study:  10/21/2006:  Excerise capacity: Good exercise capacity  Blood Pressure response: Normal blood pressure response  Clinical symptoms: No chest pain or dyspnea  ECG impression: No significant ST sgement change suggestive of ischenia  Overall impression: Low risk stress nuclear study with probable inferoseptal thinning; no ischemia.  Olga Millers, MD   09/25/2004:  Impression: Borderline stress Myoview study revealing excellent exercise tolerance, a normal stress EKG, mild left ventricular dilatation, and minimally depressed left ventricular systolic function. By scintigraphic imaging, there are minor inferior Orestes Geiman abnormalities that could represent diaphragmatic attenuation or a minimal degree of scarring. No evidence for myocardial ischemia. Other findings as noted.  When compared to a prior study performed June 25, 2002, there has been no signifcant interval change.  Gerrit Friends.  Dietrich Pates, MD, [facility]  06/25/2002:   ACCESSION:  [id number]  REPORT:  CC:  Cyan Moultrie, M. D.  CLINICAL DATA:  6-YEAR-OLD GENTLEMAN WITH RECENT   ADMISSION TO [facility] FOR CHEST PAIN.  CARDIAC   CATHETERIZATION REVEALED A SEGMENTAL Yisel Megill MOTION   ABNORMALITY IN THE DISTAL INFERIOR Aerilynn Goin BUT NO SIGNIFICANT   FOCAL ATHEROSCLEROTIC DISEASE.  RESTING CARDIOLITE STUDY  RADIONUCLIDE DATA:  ONE DAY REST ONLY CARDIOLITE STUDY   OBTAINED AFTER THE INJECTION OF 30 MCI TC-34M SESTAMIBI.   THE PATIENT REPORTED CHEST PAIN WITHIN 30 MINUTES OF   INJECTION; THUS, EXERCISE PORTION OF THE STUDY WAS NOT   PERFORMED.  SCINTIGRAPHIC DATA:  SIGNIFICANT DIAPHRAGMATIC ATTENUATION   NOTED.  MILD LEFT VENTRICULAR DILATATION PRESENT.  ON   TOMOGRAPHIC IMAGES RECONSTRUCTED IN STANDARD PLANES, THERE   WAS A SMALL MILD DEFECT IN THE MID INFEROSEPTAL Wei Poplaski   EXTENDING INFEROAPICALLY.  BY QUANTITATIVE ANALYSIS, THIS   DEFECT WAS OF BORDERLINE NUMERIC SIGNIFICANCE.  NO GATED   ANALYSIS WAS PERFORMED.  IMPRESSION:  BORDERLINE RESTING CARDIOLITE STUDY REVEALING   DECREASED ACTIVITY IN THE MID INFERIOR Israel Werts AND APEX,   POSSIBLY REFLECTING DIAPHRAGMATIC ATTENUATION.  A MINOR   DEGREE OF INFARCTION OR ISCHEMIA IN THIS REGION CANNOT BE   UNEQUIVOCALLY EXCLUDED BASED UPON THIS LIMITED STUDY.  TRANSCRIBED DATE:  TC  AS       06/27/2002 12:04 pm                                           Read By:  Gerrit Friends. Dietrich Pates, M.D.                                           Released By:   Gerrit Friends. [place]   Patient Instructions: 1)  Your physician recommends that you schedule a follow-up appointment in: 12 months 2)  Your physician recommends that you continue on your current medications as directed. Please refer to the Current Medication list given to you today. 3)  Your physician has requested that you have an exercise stress myoview.  For further information please visit [email or web address]  Please follow  instruction sheet, as given.

## 2010-04-10 NOTE — Assessment & Plan Note (Signed)
Summary: cpx//ccm   Vital Signs:  Patient profile:   65 year old male Height:      70.5 inches Weight:      182 pounds BMI:     25.84 Temp:     98.5 degrees F oral BP sitting:   110 / 70  (left arm) Cuff size:   regular  Vitals Entered By: Kern Reap CMA Duncan Dull) (December 14, 2009 8:42 AM) CC: cpx Is Patient Diabetic? Yes Did you bring your meter with you today? Yes Pain Assessment Patient in pain? no        Primary Care Provider:  Roderick Pee MD  CC:  cpx.  History of Present Illness: Hy is a 65 year old, married male, nonsmoker, who comes in today for general physical examination because of a history of hyperlipidemia, hypertension, diabetes, leukopenia, etiology unknown.  Erectile dysfunction, and arthritis.  He taking simvastatin 20 mg nightly for hyperlipidemia LDL is 63.  He takes Norvasc 10 mg daily for hypertension.  BP 110/70.  Because of the leukopenia his infectious disease physician at [facility] has him on Bactrim DS one tablet 3 times a week.  His rheumatologist, Dr. Dareen Piano has him on methotrexate 2.5-mg tablets dose 6 tabs weekly, however, it's not working.  He states a combination of prednisone and methotrexate work better than the methotrexate alone.  However  prednisone increased his blood sugar and therefore, he was weaned off the prednisone.  He will discuss this with his rheumatologist.  His diabetes is treated with metformin 500 mg b.i.d. blood sugars range in the 110 to 120 range.  A1c, however, is 7.9.  We will increase the metformin.  Cardiac-wise he is asymptomatic.  He is due to see Dr. Daleen Squibb  at the end of this month for cardiac follow-up.  He gets routine eye care, dental care, colonoscopy is a 5 year recall as he had some polyps, tetanus, 2004, Pneumovax 2006, seasonal flu shot today.  He also uses Cialis 20 mg p.r.n. for ED  Allergies: 1)  ! Pcn 2)  ! Codeine 3)  ! * Rubber  Past History:  Past medical, surgical, family  and social histories (including risk factors) reviewed, and no changes noted (except as noted below).  Past Medical History: Reviewed history from 11/30/2008 and no changes required. MYOCARDIAL INFARCTION, HX OF (ICD-412) CORONARY ARTERY DISEASE (ICD-414.00) HYPERLIPIDEMIA (ICD-272.4) HYPERTENSION (ICD-401.9) FATIGUE, CHRONIC (ICD-780.79) ANXIETY (ICD-300.00) DIABETES MELLITUS, TYPE II (ICD-250.00) OSTEOARTHRITIS (ICD-715.90) ERECTILE DYSFUNCTION (ICD-302.72) LEUKOPENIA, CHRONIC (ICD-288.50) DERMATITIS, OTHER ATOPIC (ICD-691.8) ALLERGIC RHINITIS (ICD-477.9)    Past Surgical History: Reviewed history from 11/30/2008 and no changes required. EGD-04/23/2005 Tonsillectomy Knee Arthroscopy (right)  Family History: Reviewed history from 10/06/2006 and no changes required. Family History Hypertension Fam hx COPD Fam hx CHF Fam hx Anxiety  Social History: Reviewed history from 11/30/2008 and no changes required. Married Former Smoker..quit 10/03..2ppd Alcohol use-no Drug use-no Regular exercise-yes  Review of Systems      See HPI       Flu Vaccine Consent Questions     Do you have a history of severe allergic reactions to this vaccine? no    Any prior history of allergic reactions to egg and/or gelatin? no    Do you have a sensitivity to the preservative Thimersol? no    Do you have a past history of Guillan-Barre Syndrome? no    Do you currently have an acute febrile illness? no    Have you ever had a severe reaction to latex?  no    Vaccine information given and explained to patient? yes    Are you currently pregnant? no    Lot Number:[id number]   Exp Date:09/08/2010   Site Given  Left Deltoid IM   Physical Exam  General:  Well-developed,well-nourished,in no acute distress; alert,appropriate and cooperative throughout examination Head:  Normocephalic and atraumatic without obvious abnormalities. No apparent alopecia or balding. Eyes:  No corneal or conjunctival  inflammation noted. EOMI. Perrla. Funduscopic exam benign, without hemorrhages, exudates or papilledema. Vision grossly normal. Ears:  External ear exam shows no significant lesions or deformities.  Otoscopic examination reveals clear canals, tympanic membranes are intact bilaterally without bulging, retraction, inflammation or discharge. Hearing is grossly normal bilaterally. Nose:  External nasal examination shows no deformity or inflammation. Nasal mucosa are pink and moist without lesions or exudates. Mouth:  Oral mucosa and oropharynx without lesions or exudates.  Teeth in good repair. Neck:  No deformities, masses, or tenderness noted. Chest Wall:  No deformities, masses, tenderness or gynecomastia noted. Breasts:  No masses or gynecomastia noted Lungs:  Normal respiratory effort, chest expands symmetrically. Lungs are clear to auscultation, no crackles or wheezes. Heart:  Normal rate and regular rhythm. S1 and S2 normal without gallop, murmur, click, rub or other extra sounds. Abdomen:  Bowel sounds positive,abdomen soft and non-tender without masses, organomegaly or hernias noted. Rectal:  No external abnormalities noted. Normal sphincter tone. No rectal masses or tenderness. Genitalia:  Testes bilaterally descended without nodularity, tenderness or masses. No scrotal masses or lesions. No penis lesions or urethral discharge. Prostate:  prostate is 3+ BPH, was smooth and non-nodularity Msk:  No deformity or scoliosis noted of thoracic or lumbar spine.   Pulses:  R and L carotid,radial,femoral,dorsalis pedis and posterior tibial pulses are full and equal bilaterally Extremities:  No clubbing, cyanosis, edema, or deformity noted with normal full range of motion of all joints.   Neurologic:  No cranial nerve deficits noted. Station and gait are normal. Plantar reflexes are down-going bilaterally. DTRs are symmetrical throughout. Sensory, motor and coordinative functions appear intact. Skin:   Intact without suspicious lesions or rashes Cervical Nodes:  No lymphadenopathy noted Axillary Nodes:  No palpable lymphadenopathy Inguinal Nodes:  No significant adenopathy Psych:  Cognition and judgment appear intact. Alert and cooperative with normal attention span and concentration. No apparent delusions, illusions, hallucinations  Diabetes Management Exam:    Foot Exam (with socks and/or shoes not present):       Sensory-Pinprick/Light touch:          Left medial foot (L-4): normal          Left dorsal foot (L-5): normal          Left lateral foot (S-1): normal          Right medial foot (L-4): normal          Right dorsal foot (L-5): normal          Right lateral foot (S-1): normal       Sensory-Monofilament:          Left foot: normal          Right foot: normal       Inspection:          Left foot: normal          Right foot: normal       Nails:          Left foot: normal  Right foot: normal    Eye Exam:       Eye Exam done elsewhere          Date: 09/19/2009          Results: normal          Done by: cotter   Problems:  Medical Problems Added: 1)  Dx of Routine General Medical Exam[facility]  (ICD-V70.0) 2)  Dx of Benign Prostatic Hypertrophy, Hx of  (ICD-V13.8)  Impression & Recommendations:  Problem # 1:  HYPERLIPIDEMIA (ICD-272.4) Assessment Improved  His updated medication list for this problem includes:    Simvastatin 20 Mg Tabs (Simvastatin) .Marland Kitchen... 1 at bedtime    Niacin 500 Mg Tabs (Niacin) .Marland Kitchen... Take one tab once daily  His updated medication list for this problem includes:    Simvastatin 20 Mg Tabs (Simvastatin) .Marland Kitchen... 1 at bedtime    Niacin 500 Mg Tabs (Niacin) .Marland Kitchen... Take one tab once daily  Orders: Prescription Created Electronically [phone] EKG w/ Interpretation (93000)  Problem # 2:  HYPERTENSION (ICD-401.9) Assessment: Improved  His updated medication list for this problem includes:    Norvasc 10 Mg Tabs (Amlodipine  besylate) .Marland Kitchen... Take 1 tablet by mouth every morning  His updated medication list for this problem includes:    Norvasc 10 Mg Tabs (Amlodipine besylate) .Marland Kitchen... Take 1 tablet by mouth every morning  Orders: Prescription Created Electronically [phone]) EKG w/ Interpretation (93000)  Problem # 3:  DIABETES MELLITUS, TYPE II (ICD-250.00) Assessment: Deteriorated  His updated medication list for this problem includes:    Aspirin 325 Mg Tabs (Aspirin) .Marland Kitchen... Take once daily    Metformin Hcl 500 Mg Tabs (Metformin hcl) .Marland Kitchen... 1 < pm meal    Metformin Hcl 1000 Mg Tabs (Metformin hcl) .Marland Kitchen... Take 1 tablet by mouth every morning < bfk  His updated medication list for this problem includes:    Aspirin 325 Mg Tabs (Aspirin) .Marland Kitchen... Take once daily    Metformin Hcl 500 Mg Tabs (Metformin hcl) .Marland Kitchen... Take 1 tablet by mouth two times a day  Orders: Prescription Created Electronically [phone])  Problem # 4:  ERECTILE DYSFUNCTION (ICD-302.72) Assessment: Improved  The following medications were removed from the medication list:    Levitra 20 Mg Tabs (Vardenafil hcl) ..... Uad His updated medication list for this problem includes:    Cialis 10 Mg Tabs (Tadalafil) ..... Use as directed  The following medications were removed from the medication list:    Levitra 20 Mg Tabs (Vardenafil hcl) ..... Uad His updated medication list for this problem includes:    Cialis 10 Mg Tabs (Tadalafil) ..... Use as directed  Orders: Prescription Created Electronically [phone])  Problem # 5:  BENIGN PROSTATIC HYPERTROPHY, HX OF (ICD-V13.8) Assessment: Unchanged  Orders: Prescription Created Electronically [phone])  Problem # 6:  Preventive Health Care (ICD-V70.0) Assessment: Unchanged  Complete Medication List: 1)  Simvastatin 20 Mg Tabs (Simvastatin) .Marland Kitchen.. 1 at bedtime 2)  Fluconazole 200 Mg Tabs (Fluconazole) .... As needed 3)  Norvasc 10 Mg Tabs (Amlodipine besylate) .... Take 1 tablet by mouth every  morning 4)  Bactrim Ds 800-160 Mg Tabs (Sulfamethoxazole-trimethoprim) .... One once daily prn 5)  Triamcinolone Acetonide 0.1 % Crea (Triamcinolone acetonide) .... As needed 6)  Nitroquick 0.3 Mg Subl (Nitroglycerin) .... Take one tab under tongue as needed 7)  Fluocinonide 0.05 % Oint (Fluocinonide) .... Apply once daily as needed 8)  Niacin 500 Mg Tabs (Niacin) .... Take one tab once daily 9)  Aspirin 325 Mg Tabs (Aspirin) .... Take once daily 10)  Methotrexate 2.5 Mg Tabs (Methotrexate sodium) .... Take 6 tabs once weekly 11)  Folic Acid 1 Mg Tabs (Folic acid) .... Once daily 12)  Metformin Hcl 500 Mg Tabs (Metformin hcl) .Marland Kitchen.. 1 < pm meal 13)  Bd Ultra-fine Lancets Misc (Lancets) .... Use once daily for glucose control 14)  Accu-chek Aviva Strp (Glucose blood) .... Use once daily for glucose readings 15)  Cialis 10 Mg Tabs (Tadalafil) .... Use as directed 16)  Metformin Hcl 1000 Mg Tabs (Metformin hcl) .... Take 1 tablet by mouth every morning < bfk  Other Orders: Admin 1st Vaccine (93235) Flu Vaccine 41yrs + (57322)  Patient Instructions: 1)  increase the metformin, take 1000 mg prior to breakfast and continue the 500-mg dose prior to evening meal. 2)  Return in 4 weeks for follow-up with your daily blood sugar record Prescriptions: CIALIS 10 MG TABS (TADALAFIL) use as directed  #6 x 11   Entered and Authorized by:   Roderick Pee MD   Signed by:   Roderick Pee MD on 12/14/2009   Method used:   Electronically to        [facility] [address] [phone]* (retail)       [address], [place]  [address]       Ph: [phone]       Fax: [phone]   RxID:   [id number] ACCU-CHEK AVIVA  STRP (GLUCOSE BLOOD) use once daily for glucose readings  #100 x 3   Entered and Authorized by:   Roderick Pee MD   Signed by:   Roderick Pee MD on 12/14/2009   Method used:   Electronically to        [facility] [address] [phone]* (retail)       [address], [place]  [address]       Ph: [phone]       Fax: [phone]   RxID:   [id number] BD ULTRA-FINE LANCETS  MISC (LANCETS) use once daily for glucose control  #100 x 3   Entered and Authorized by:   Roderick Pee MD   Signed by:   Roderick Pee MD on 12/14/2009   Method used:   Electronically to        [facility] [address] [phone]* (retail)       [address] [place]  [address]       Ph: [phone]       Fax: [phone]   RxID:   [id number] NIACIN 500 MG TABS (NIACIN) take one tab once daily  #100 x 3   Entered and Authorized by:   Roderick Pee MD   Signed by:   Roderick Pee MD on 12/14/2009   Method used:   Electronically to        [facility] [address] [phone]* (retail)       [address] [place], [place]  [address]       Ph: [phone]       Fax: [phone]   RxID:   [id number] NITROQUICK 0.3 MG SUBL (NITROGLYCERIN) take one tab under tongue as needed  #25 x 2   Entered and Authorized by:   Roderick Pee MD   Signed by:   Eugenio Hoes  Todd MD on 12/14/2009   Method used:   Electronically to        [facility]. [phone]* (retail)       [address] [place], [place]  [address]       Ph: [phone]       Fax: [phone]   RxID:   [id number] BACTRIM DS 800-160 MG  TABS (SULFAMETHOXAZOLE-TRIMETHOPRIM) one once daily prn  #100 x 3   Entered and Authorized by:   Roderick Pee MD   Signed by:   Roderick Pee MD on 12/14/2009   Method used:   Electronically to        [facility] [address] [phone]* (retail)       [address] [place], [place]  [address]       Ph: [phone]       Fax: [phone]   RxID:   [id number] NORVASC 10 MG  TABS (AMLODIPINE BESYLATE) Take 1 tablet by mouth every morning  #100 x 3   Entered and Authorized by:   Roderick Pee MD   Signed by:   Roderick Pee MD on 12/14/2009   Method used:   Electronically to        [facility] [address] [phone]* (retail)       [address] [place]  [address]       Ph: [phone]       Fax: [phone]   RxID:   [id number] SIMVASTATIN 20 MG  TABS (SIMVASTATIN) 1 at bedtime  #100 Tablet x 3   Entered and Authorized by:   Roderick Pee MD   Signed by:   Roderick Pee MD on 12/14/2009   Method used:   Electronically to        [facility] [address] [phone]* (retail)       [address] [place], [place]  [address]       Ph: [phone]       Fax: [phone]   RxID:   [id number] METFORMIN HCL 500 MG TABS (METFORMIN HCL) 1 < pm meal  #100 x 3   Entered and Authorized by:   Roderick Pee MD   Signed by:   Roderick Pee MD on 12/14/2009   Method used:   Electronically to        [facility] [address] [phone]* (retail)       [address] [place]  [address]       Ph: [phone]       Fax: [phone]   RxID:   [id number] METFORMIN HCL 1000 MG TABS (METFORMIN HCL) Take 1 tablet by mouth every morning < bfk  #100 x 3   Entered and Authorized by:   Roderick Pee MD   Signed by:   Roderick Pee MD on 12/14/2009   Method used:   Electronically to        [facility] [address] [phone]* (retail)       [address] [place]  [address]       Ph: [phone]       Fax: [phone]   RxID:   [id number]

## 2010-04-13 ENCOUNTER — Other Ambulatory Visit: Payer: Self-pay | Admitting: Family Medicine

## 2010-04-13 ENCOUNTER — Encounter (INDEPENDENT_AMBULATORY_CARE_PROVIDER_SITE_OTHER): Payer: Self-pay | Admitting: *Deleted

## 2010-04-13 ENCOUNTER — Ambulatory Visit: Admit: 2010-04-13 | Payer: Self-pay | Admitting: Family Medicine

## 2010-04-13 ENCOUNTER — Other Ambulatory Visit: Payer: Medicare Other

## 2010-04-13 DIAGNOSIS — E119 Type 2 diabetes mellitus without complications: Secondary | ICD-10-CM

## 2010-04-13 LAB — BASIC METABOLIC PANEL
BUN: 26 mg/dL — ABNORMAL HIGH (ref 6–23)
Chloride: 104 mEq/L (ref 96–112)
Creatinine, Ser: 1 mg/dL (ref 0.4–1.5)
GFR: 80.6 mL/min (ref >60.00)
Potassium: 5 mEq/L (ref 3.5–5.1)

## 2010-04-13 LAB — HEMOGLOBIN A1C: Hgb A1c MFr Bld: 5.9 % (ref 4.6–6.5)

## 2010-04-20 ENCOUNTER — Encounter: Payer: Self-pay | Admitting: Family Medicine

## 2010-04-20 ENCOUNTER — Ambulatory Visit (INDEPENDENT_AMBULATORY_CARE_PROVIDER_SITE_OTHER): Payer: Medicare Other | Admitting: Family Medicine

## 2010-04-20 VITALS — BP 110/70 | Temp 98.3°F | Ht 70.0 in | Wt 179.0 lb

## 2010-04-20 DIAGNOSIS — N400 Enlarged prostate without lower urinary tract symptoms: Secondary | ICD-10-CM

## 2010-04-20 DIAGNOSIS — E119 Type 2 diabetes mellitus without complications: Secondary | ICD-10-CM

## 2010-04-20 DIAGNOSIS — R972 Elevated prostate specific antigen [PSA]: Secondary | ICD-10-CM

## 2010-04-20 NOTE — Assessment & Plan Note (Signed)
Continue your current medications.  Follow-up for your annual physical exam in the fall

## 2010-04-20 NOTE — Progress Notes (Signed)
  Subjective:    Patient ID: Chase Anderson, male    DOB: 1945-10-10, 65 y.o.   MRN: [id number]  HPI  Chase Anderson. Is a 65 year old, married male, nonsmoker, who comes in today for follow-up of type 2 diabetes.  He is on metformin 1000 mg prior to breakfast and 500 mg prior to his evening meal.  Blood sugars in the 110, 120 range, with an A1c of 5.9%, which represents excellent control.  Last fall.  His hemoglobin A1c went up to 7%.  However, at that time, he was on steroids.  No hypoglycemia  Review of Systems Other than above, negative    Objective:   Physical Exam Well-developed well-nourished, male in no acute distress       Assessment & Plan:

## 2010-04-20 NOTE — Patient Instructions (Signed)
Return in the fall for your annual exam.  Continue current medications

## 2010-07-24 NOTE — Assessment & Plan Note (Signed)
[facility]                       [place] CARDIOLOGY OFFICE NOTE   Chase Anderson, Chase Anderson                      MRN:          [id number]  DATE:10/28/2007                            DOB:          27-Feb-1946    HISTORY OF PRESENT ILLNESS:  Mr. Taul comes in today for further  management of his nonobstructive coronary artery disease.  He is having  no symptoms of angina or ischemic equivalence.  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   He is trying to get disability through this.   We did a stress Myoview on October 21, 2006, and he had excellent  exercise tolerance of 9 minutes and 3 seconds.  He had no ST-segment  changes.  His peak heart rate was 141, which is 89% of predicted  maximum.  EF was 51% with no ischemia.  There was a question of some  inferior septal thinning.   He denies any orthopnea, PND, or peripheral edema.  He has had no  palpitations, presyncope, or syncope.   MEDICATIONS:  1. Norvasc 10 mg a day.  2. Zocor 20 mg a day.  3. Bactrim 3 per week.  4. Aspirin 325 mg a day.  5. Niacin 500 mg a day.  6. Ibuprofen 800 mg 2 a day, sometimes 3 times a day.  7. Fluconazole 100 mg p.r.n.   PHYSICAL EXAMINATION:  VITAL SIGNS:  His blood pressure today is 100/70,  his pulse is 78 and regular, his weight is 181, which is stable.  HEENT:  Unchanged.  NECK:  Carotid upstrokes are equal bilaterally without bruits.  No JVD.  Thyroid is not enlarged.  Trachea is midline.  LUNGS:  Clear.  HEART:  Reveals a nondisplaced PMI.  Normal S1 and S2.  There is a soft  systolic murmur along the left sternal border.  ABDOMEN:  Soft.  Good bowel sounds.  No pulsatile mass.  No  hepatosplenomegaly.  EXTREMITIES:  No cyanosis, clubbing, or edema.  Pulses are intact.  SKIN:  Shows vitiligo, particularly of the anterior legs.  NEURO:  Intact.   ASSESSMENT AND PLAN:  Mr. Falletta is doing well from my  standpoint.  I  have made no changes in the medical program.  I have told him that he  will not need disability from his cardiac condition.  He realizes this.  We will see him back again in a year.     Thomas C. Daleen Squibb, MD, [facility]  Electronically Signed    TCW/MedQ  DD: 10/28/2007  DT: 10/29/2007  Job #: [id number]

## 2010-07-24 NOTE — Assessment & Plan Note (Signed)
[facility]                            CARDIOLOGY OFFICE NOTE   SYON, TEWS                      MRN:          [id number]  DATE:10/28/2006                            DOB:          05-06-1945    [name] returns today for further management of nonobstructive  coronary disease.  He is having no symptoms of angina.   Recent stress Myoview October 21, 2006 showed an exercise for 9 minutes  and 2 seconds.  No ST segment changes.  Normal blood pressure response  to exercise.  Peak heart rate of 141, which is 89% of predicted maximum  heart rate.  His EF was 51% with normal contractility and thickening of  all areas of the myocardium.  There was no evidence of ischemia.  There  was the question of inferior septal thinning.   MEDICATIONS:  1. Norvasc 10 mg a day.  2. Zocor 20 mg a day.  3. Bactrim 3 per week.  4. Aspirin 325 mg a day.  5. Niacin 500 mg a day.  6. Ibuprofen 800 mg twice a day.  7. Fluconazole 100 mg p.r.n.   EXAM:  Blood pressure is 118/76, pulse 66 and regular, weight 181  pounds.  HEENT:  Normocephalic, atraumatic.  PERRLA.  Extraocular movements  intact.  Sclerae clear.  Facial symmetry is normal.  NECK:  Supple.  Carotid upstrokes are equal bilaterally without bruits.  No JVD.  Thyroid is not enlarged.  Trachea midline.  LUNGS:  Clear.  HEART:  Reveals a regular rate and rhythm without gallops, murmurs, or  rubs.  ABDOMEN:  Soft with no midline bruit.  No hepatomegaly.  EXTREMITIES:  No cyanosis, clubbing, or edema.  Pulses are intact.  NEURO:  Intact.   ELECTROCARDIOGRAM:  Shows sinus rhythm with a left axis deviation, but  no change from before.   ASSESSMENT AND PLAN:  [name] is doing well.  He is having no symptoms  of ischemia and he has a low-risk Myoview.  I have asked him to continue  with secondary risk factor modification and follow up with Dr. Tawanna Cooler  concerning recent blood work.  We will see him back  again in a year.    Thomas C. Daleen Squibb, MD, [facility]  Electronically Signed   TCW/MedQ  DD: 10/28/2006  DT: 10/28/2006  Job #: [id number]

## 2010-07-27 NOTE — Cardiovascular Report (Signed)
NAME:  Chase Anderson, Chase Anderson                         ACCOUNT NO.:  [id number]   MEDICAL RECORD NO.:  [id number]                   PATIENT TYPE:  OIB   LOCATION:  3711                                 FACILITY:  [facility]   PHYSICIAN:  Veneda Melter, M.D. [facility]               DATE OF BIRTH:  08-07-1945   DATE OF PROCEDURE:  06/09/2002  DATE OF DISCHARGE:                              CARDIAC CATHETERIZATION   PROCEDURE PERFORMED:  1. Left heart catheterization.  2. Left ventriculogram.  3. Selective coronary angiography.   DIAGNOSES:  1. Mild coronary artery disease by angiogram.  2. Normal left ventricular systolic function.  3. Status post aborted inferior wall myocardial infarction.   HISTORY:  The patient is a 65 year old gentleman who has had stuttering  chest discomfort in the past several days.  The patient presented to [facility] recently and had no ECG changes and was stabilized medically and  discharged.  He presented to the cardiology office today for further  assessment and developed severe substernal chest discomfort.  ECG showed ST  elevation in the inferior walls.  He was anticoagulated, started on  nitroglycerin drip, and was transferred urgently to [facility].  En  route, he had resolution of pain as well as ECG changes.  He presents to the  catheterization lab for urgent angiography.   TECHNIQUE:  Informed consent was obtained.  The patient was brought to the  catheterization lab.  A 7-French sheath was placed in the right femoral  artery using the modified Seldinger technique.  JL4 and JR4 6-French  catheters were then used to engage the left and right coronary arteries, and  selective angiography was performed in various projections using manual  injections of contrast.  A 6-French pigtail catheter was advanced to the  left ventricle, and a left ventriculogram performed using power injections  of contrast.  At the termination of the case, the catheters and sheath  were  removed, and manual pressure applied until adequate hemostasis was achieved.  The patient tolerated the procedure well and was transferred to the floor in  stable condition.  Findings are as follows:   FINDINGS:  1. Left main trunk:  Large caliber vessel.  There is distal eccentric plaque     of 30%.  2. LAD:  This is a medium caliber vessel that provides a diagonal branch in     the mid section.  The LAD has mild disease of 30% in the mid and distal     sections.  3. Left circumflex artery:  This is a medium caliber vessel that provides 4     small marginal branches, the first who arise in the proximal mid section,     and 2 distal posterolateral branches are noted.  The left circumflex     system has mild irregularities of 20% to 30%.  4. Ramus intermedius:  There are 2 intermedius arteries that  arise between     the LAD and left circumflex artery.  These traverse along the     anterolateral wall and have mild irregularities.  5. Right coronary:  Dominant.  This is a large caliber vessel that provides     a posterior descending artery and several small posterior ventricular     branches in its terminal segment.  The right coronary artery has mild     irregularities in the mid section.  The distal branch vessels are patent     and have mild irregularities.  There is some competitive flow noted in     the small first posterior ventricular branch, although no clear stenosis     is identified.  This vessel is also not seen to fill on injections into     the left coronary system.  6. LV:  Normal dimensions.  Left ventricular function is well preserved.     Ejection fraction of greater than 65%.  There is severe hypokinesis of     the distal inferior wall.  No mitral regurgitation noted.   HEMODYNAMICS:  1. LV pressure is 130/10.  2. Aortic is 130/80.  3. LVEDP equals 15.   ASSESSMENT AND PLAN:  The patient is a 65 year old gentleman with mild  coronary artery disease who  presents with aborted inferior wall myocardial  infarction.  He does have wall motion abnormality of the distal inferior  wall.  This may represent ischemia from the distal posterior ventricular  branch of the right coronary artery which is a small caliber vessel and does  exhibit competitive flow.  No critical stenosis is noted in the body of the  right coronary artery.  Continued medical therapy will be pursued, and  aggressive risk factor modification initiated.                                               Veneda Melter, M.D. [facility]    NG/MEDQ  D:  06/09/2002  T:  06/09/2002  Job:  [id number]   cc:   Thomas C. Wall, M.D. [facility]

## 2010-07-27 NOTE — H&P (Signed)
NAME:  Chase Anderson, Chase Anderson                         ACCOUNT NO.:  [id number]   MEDICAL RECORD NO.:  [id number]                   PATIENT TYPE:  INP   LOCATION:  3732                                 FACILITY:  [facility]   PHYSICIAN:  Arturo Morton. Riley Kill, M.D. [facility]         DATE OF BIRTH:  [birth year]   DATE OF ADMISSION:  06/13/2002  DATE OF DISCHARGE:                                HISTORY & PHYSICAL   CHIEF COMPLAINT:  Chest pain.   HISTORY OF PRESENT ILLNESS:  The patient is a delightful 65 year old  gentleman who was just discharged from the hospital on Friday.  He presented  about a week earlier with sudden onset of chest pain.  They thought that he  had had an aborted myocardial infarction; however, his enzymes were  negative.  There were some borderline EKG changes.  Catheterization revealed  a questionable wall motion abnormality but also a questionable  posterolateral occlusion.  I had reviewed the films and these indeed were  difficult to discern for sure.  His enzymes were negative.  He was  subsequently placed on Norvasc, beta-blockers were discontinued, and aspirin  and Plavix were continued.  He went home but has continued to have episodes  of some discomfort on exertion.  He denies reflux-type symptoms.  He had  another episode of chest discomfort today and was brought by his wife to the  emergency room.   PAST MEDICAL HISTORY:  1. History of an arthroscopic surgery on his knee.  2. There is no history of hypertension or diabetes although borderline     diabetes has been questioned.  3. He has had arthritis for some time.   ALLERGIES:  PENICILLIN.   MEDICATIONS:  1. Foltx 1 daily.  2. Coated aspirin 1 daily.  3. Plavix 75 mg daily.  4. Norvasc 5 mg daily.  5. Nitroglycerin 0.4 mg daily.   FAMILY HISTORY:  Father died at 31 of COPD and CHF.  Mother was 65 and has  Alzheimer's and Parkinson's.   SOCIAL HISTORY:  The patient was a smoker but quit in the fall of this past  year.  He is married.  He worked for the [employer].  He and his wife each have two children of  their own.  He is retired.   REVIEW OF SYSTEMS:  Remarkable for no GI or urinary symptoms.  He has had an  occasional rash on the medial aspect of both arms.  The history is otherwise  negative.   PHYSICAL EXAMINATION:  GENERAL:  He is an alert and oriented male in no  distress.  VITAL SIGNS:  Blood pressure 170/86, pulse 80, respiratory rate 22,  saturation 97%.  LUNGS:  Clear.  CARDIAC:  Rhythm regular.  SKIN:  Small hematoma of the right groin.  NEUROLOGIC:  Nonfocal.  ABDOMEN:  Soft.  No hepatosplenomegaly.   LABORATORY DATA:  EKG reveals normal  sinus rhythm.  There are no acute  changes.   IMPRESSION:  The patient has had recurrent chest pain.  He carries a  diagnosis of aborted myocardial infarction and this has been made with  appropriate review of the current data; however, all of the data is somewhat  borderline.  The patient previously had chest pain even on an intravenous  nitroglycerin drip.   PLAN:  1. Admit.  2. Serial CK's and troponins.  3. Add Protonix.  4. Review data with Dr. Daleen Squibb.  5. Check D-dimer.                                               Arturo Morton. Riley Kill, M.D. [facility]    TDS/MEDQ  D:  06/13/2002  T:  06/14/2002  Job:  [id number]   cc:   Thomas C. Daleen Squibb, M.D. [facility]   Veneda Melter, M.D. [facility]

## 2010-07-27 NOTE — Discharge Summary (Signed)
NAME:  Chase Anderson, Chase Anderson NO.:  [id number]   MEDICAL RECORD NO.:  [id number]                   PATIENT TYPE:  OIB   LOCATION:  3711                                 FACILITY:  [facility]   PHYSICIAN:  Veneda Melter, M.D. [facility]               DATE OF BIRTH:  06/07/1945   DATE OF ADMISSION:  06/09/2002  DATE OF DISCHARGE:  06/11/2002                           DISCHARGE SUMMARY - REFERRING   DISCHARGE DIAGNOSIS:  1. Aborted myocardiosis infarction.  2. Elevated homocysteine level.  3. Hypertension.  4. Coronary artery disease.  5. Normal  LV function.   HISTORY OF PRESENT ILLNESS:  The patient is a 65 year old male patient who  was seen in the office in [place], [place] under the care of Dr.  Juanito Doom for stuttering chest pain over the past several days prior to his  admission.  EKG showed ST elevation in the inferior walls.  He was  anticoagulated and started on IV nitroglycerin.  He was transferred urgently  to [facility].  En route he had resolution of his chest pain.  However,  he was taken to the  cardiac catheterization lab urgently.  The cardiac  catheterization revealed left main with distal 30% LAD with a mid 30%  proximal lesion, two marginals and 30% lesion, two ramus intermedius with  mild disease.  RCA was dominant  with mild disease.  There was a question of  either a truncated PV branch with competitive flow with periferal disease.  Captured  LV gram reveals 65% with hypokinesis of the distal inferior wall  with no MR.  Dr. Chales Abrahams at this point felt the patient had presented with an  aborted inferior wall myocardial infarction with a wall motion abnormality  of the distal inferior wall.  Continuing medical therapy was perceived.   LABORATORY DATA:  The lab work done on the patient today revealed normal  cardiac isoenzymes.  Sodium 136, potassium 3.9, BUN 11, creatinine 1.1.  White count 7.0, hemoglobin 13.7, hematocrit 39.1, platelets  223.  CRP 14.3.  Total cholesterol 153.  Triglycerides 146.  HDL 31, LDL 93.  TSH 1.511.   MEDICATIONS:  The patient is discharged to home in stable condition on  06/11/2002 on the following medications:  1. __________ take one tablet daily.  2. Enteric coated aspirin 325 mg a day.  3. Plavix 75 mg a day.  4. Norvasc 5 mg a day.  5. Sublingual nitroglycerin p.r.n. chest pain.  6. Tylenol 1-2 tablets every six hours p.r.n. pain.    DISCHARGE INSTRUCTIONS:  Continue rest for two days and gradually increase  activity.  Low-fat diet.  Call for any questions or concerns.  Follow up  with Dr. Daleen Squibb on 06/30/2002 at 10 a.m.         Guy Franco, P.A. [facility]  Veneda Melter, M.D. [facility]    LB/MEDQ  D:  06/11/2002  T:  06/12/2002  Job:  [id number]   cc:   Dr. Juanito Doom

## 2010-07-27 NOTE — Discharge Summary (Signed)
NAME:  Chase Anderson, Chase Anderson                         ACCOUNT NO.:  [id number]   MEDICAL RECORD NO.:  [id number]                   PATIENT TYPE:  INP   LOCATION:  3732                                 FACILITY:  [facility]   PHYSICIAN:  Arturo Morton. Riley Kill, M.D. [facility]         DATE OF BIRTH:  September 26, 1945   DATE OF ADMISSION:  06/13/2002  DATE OF DISCHARGE:  06/15/2002                                 DISCHARGE SUMMARY   DISCHARGE DIAGNOSES:  1. Chest pain, etiology unclear.  2. Coronary artery disease.     A. Status post aborted inferior wall MI on June 09, 2002.     B. Catheterization on June 09, 2002, revealing distal left main stenosis        of 30%, left anterior descending diagonal 1 mid 30%, left circumflex        four obtuse marginals with 30% stenosis.  Ramus intermedius with mild        disease.  Right coronary artery dominant with mild disease.  Some        competitive flow noted in the small first posterior ventricular vein.        Left ventricular ejection fraction 65% with severe hypokinesis of the        distal inferior wall.  3. Degenerative joint disease.  4. Anxiety.   HOSPITAL COURSE:  Please see the dictated note by Arturo Morton. Riley Kill, M.D.  [facility] on June 13, 2002, for complete details.  Briefly, this 65 year old male  who has recently been discharged from [facility] after suffering an  aborted inferior MI, returns to the emergency room on June 13, 2002,  complaining of continuing chest pain.  His cardiac enzymes were negative for  MI.  A D-dimer was negative.  He was placed on Protonix and Xanax.  He was  seen Dr. Dietrich Pates on June 15, 2002.  At that time, he noted that he felt  better, complained of some fatigue and dyspnea.  All of his cardiac enzymes  were negative.  A second D-dimer was checked and this was also negative.  It  was felt that the patient could be discharged to home. He was placed on  IMDUR 60 mg daily for three days and then he would increase down to 20  mg  daily.  He will need a follow-up stress test in [place] and his follow-up  appointment will remain the same with Dr. Daleen Squibb in [place].   LABORATORY DATA:  White blood cell count 5500, hemoglobin 16.1, hematocrit  45.4, platelet count 292,000, INR 1.0.  D-dimer as noted above.  Sodium 133,  potassium 3.8, chloride 101, CO2 24, glucose 131, BUN 20, creatinine 1.2,  total bilirubin 0.8, alkaline phosphatase 59, AST 21, ALT 29, total protein  6.7, albumin 3.8, calcium 9.2.  Cardiac enzymes negative x3.  Admission  chest x-ray;  suboptimal inspiration, no active findings.   DISCHARGE MEDICATIONS:  1. Protonix 40 mg  daily.  2. Xanax 0.25 mg b.i.d. p.r.n. anxiety.  3. IMDUR 120 mg 1/2 tablet daily x3 days and then one whole tablet daily.   DISCHARGE INSTRUCTIONS:  The patient is to call our office or 911 with  recurrent chest pain.  Activity is as tolerated.  Diet is low fat and low  sodium.    FOLLOW UP:  The patient has a stress Cardiolite scheduled in [place] at  [facility] on Thursday, June 24, 2002, at 9:30 a.m.  He  has been instructed to arrive at the front desk of [facility] at 7 a.m. for his test and he has been instructed to remain NPO  from midnight. He may take all of his medications.  He has follow-up already  scheduled with Dr. Daleen Squibb in [place] on June 30, 2002, at 10 a.m.     Tereso Newcomer, P.A.                        Arturo Morton. Riley Kill, M.D. [facility]    SW/MEDQ  D:  06/15/2002  T:  06/15/2002  Job:  [id number]   cc:   Dr. Daleen Squibb in [place]

## 2010-07-27 NOTE — Assessment & Plan Note (Signed)
[facility]                              CARDIOLOGY OFFICE NOTE   Chase Anderson, Chase Anderson                      MRN:          [id number]  DATE:10/09/2005                            DOB:          [birth year]    Chase Anderson comes in today for further management of his nonobstructive  coronary artery disease.  Other than some fatigue, he is having no symptoms  of ischemia.   MEDICATIONS:  1.  Norvasc 10 mg a day.  2.  Zocor 20 mg a day.  3.  Bactrim three per week.  4.  Aspirin 325 a day.  5.  Niacin 500 daily.  6.  Ibuprofen 800 two a day.  7.  Fluconazole 100 mg p.r.n.  8.  AcipHex 20 mg a day.   His blood was checked this past week by Dr. Tawanna Cooler.  His lipids are at goal.  His blood sugar was mildly elevated at 127.   EXAMINATION:  Blood pressure 139/80, pulse 76 and regular, weight 181.  He  is in no acute distress.  HEENT:  Normocephalic, atraumatic.  PERRLA.  Extraocular movements intact.  Sclerae clear.  Facial asymptomatic is normal.  Dentition satisfactory.  Carotids are full.  There is no bruit.  There is no JVD.  Thyroid is not  enlarged.  LUNGS:  Clear.  HEART:  Reveals a regular rate and rhythm.  ABDOMINAL EXAM:  Soft.  No midline bruit.  Good bowel sounds.  EXTREMITIES:  Reveal no cyanosis, clubbing or edema.  Pulses are brisk.  NEUROLOGIC EXAM:  Intact.   His electrocardiogram from yesterday shows normal sinus rhythm with some  nonspecific ST-segment changes inferiorly.  These are stable.   I think Chase Anderson is doing well.  I have made no changes in his program.  Will see him back in a year at which time he will need an objective  assessment of his coronary vessels.                               Thomas C. Daleen Squibb, MD, [facility]    TCW/MedQ  DD:  10/09/2005  DT:  10/09/2005  Job #:  [id number]   cc:   Tinnie Gens A. Tawanna Cooler, MD

## 2010-07-27 NOTE — H&P (Signed)
NAME:  Chase Anderson, BIERMANN                         ACCOUNT NO.:  [id number]   MEDICAL RECORD NO.:  [id number]                   PATIENT TYPE:  OIB   LOCATION:  2890                                 FACILITY:  [facility]   PHYSICIAN:  Jesse Sans. Wall, M.D. [facility]            DATE OF BIRTH:  1945-06-27   DATE OF ADMISSION:  06/09/2002  DATE OF DISCHARGE:                                HISTORY & PHYSICAL   CHIEF COMPLAINT:  Chest tightness and pressure, particularly with activity  over the past week.   HISTORY OF PRESENT ILLNESS:  The patient is a 65 year old married white male  who comes today referred by Dr. Gray Bernhardt of the [facility].  He  presented there on March 29 with chest pain, shortness of breath,  palpitations.  Workup was apparently negative.  He was sent home and to  follow up with [place].   He has had an uneasy feeling as he describes it, even at rest.  He is about  a 1-2/10 at present.  It clearly is worse with exertion.  It gets better  when he lays completely still.   His risk factors include male sex, age, tobacco use--two packs per day,  which he quit in October 2003.  We have no known lipid status.  He is  hypertensive today.   PAST MEDICAL HISTORY:  1. He had a tonsillectomy.  2. He has had an arthroscopy of the right knee.  He has arthritis of both     knees.  3. He is allergic to PENICILLIN.  4. He does not smoke any longer.   MEDICATIONS:  He occasionally takes an Aleve.   REVIEW OF SYSTEMS:  Unremarkable other than HPI.   SOCIAL HISTORY:  He is retired from [employer].  He is married and has two children and two stepchildren.  His father died at 68 of COPD and question of coronary disease.  His mother  is alive at 65 with hypertension.  His brother is 35 and well.   PHYSICAL EXAMINATION:  GENERAL:  He is a bit anxious.  VITAL SIGNS:  He is 6 feet tall.  He weighs 198 pounds.  His blood pressure  is 170/86 in the right  arm and 172/90 in the left arm.  His pulse is 65 and  regular.  HEENT:  Unremarkable.  NECK:  No JVD.  Carotid upstrokes are equal bilaterally without bruits.  There is no thyromegaly.  The trachea is midline.  LUNGS:  Clear to auscultation and percussion.  HEART:  Regular rate and rhythm without gallop.  He has a frequent extra  beat.  ABDOMEN:  Soft.  Good bowel sounds.  There is no midline bruit.  There is no  hepatomegaly.  EXTREMITIES:  No cyanosis, clubbing, or edema.  Pulses were brisk.  NEUROLOGIC:  Exam is intact.   LABORATORY DATA:  EKG shows normal  sinus rhythm with no acute changes.  He  has one PVC.    ASSESSMENT AND PLAN:  1. Unstable angina with multiple risk factors.  We are transporting him by     [facility] for a cardiac catheterization either     later today or tomorrow morning.  He will need intravenous heparin which     will start with [facility], aspirin, and we will start him on a beta-     blocker.  He may need intravenous nitroglycerin once he arrives at [facility]     if he is still hurting.  2. Hypertension.  3. Unknown lipid status.  4. Remote tobacco.                                               Maisie Fus C. Daleen Squibb, M.D. [facility]    TCW/MEDQ  D:  06/09/2002  T:  06/09/2002  Job:  [id number]

## 2010-09-17 ENCOUNTER — Other Ambulatory Visit: Payer: Self-pay | Admitting: Family Medicine

## 2010-12-05 ENCOUNTER — Other Ambulatory Visit: Payer: Self-pay | Admitting: Family Medicine

## 2010-12-17 ENCOUNTER — Other Ambulatory Visit: Payer: Medicare Other

## 2010-12-18 ENCOUNTER — Encounter: Payer: Self-pay | Admitting: Family Medicine

## 2010-12-18 ENCOUNTER — Ambulatory Visit (INDEPENDENT_AMBULATORY_CARE_PROVIDER_SITE_OTHER): Payer: Medicare Other | Admitting: Family Medicine

## 2010-12-18 DIAGNOSIS — Z87898 Personal history of other specified conditions: Secondary | ICD-10-CM

## 2010-12-18 DIAGNOSIS — L2089 Other atopic dermatitis: Secondary | ICD-10-CM

## 2010-12-18 DIAGNOSIS — E785 Hyperlipidemia, unspecified: Secondary | ICD-10-CM

## 2010-12-18 DIAGNOSIS — D72819 Decreased white blood cell count, unspecified: Secondary | ICD-10-CM

## 2010-12-18 DIAGNOSIS — F528 Other sexual dysfunction not due to a substance or known physiological condition: Secondary | ICD-10-CM

## 2010-12-18 DIAGNOSIS — Z0000 Encounter for general adult medical examination without abnormal findings: Secondary | ICD-10-CM

## 2010-12-18 DIAGNOSIS — I10 Essential (primary) hypertension: Secondary | ICD-10-CM

## 2010-12-18 DIAGNOSIS — E119 Type 2 diabetes mellitus without complications: Secondary | ICD-10-CM

## 2010-12-18 LAB — MICROALBUMIN / CREATININE URINE RATIO: Microalb, Ur: 3.4 mg/dL — ABNORMAL HIGH (ref 0.0–1.9)

## 2010-12-18 LAB — POCT URINALYSIS DIPSTICK
Bilirubin, UA: NEGATIVE
Blood, UA: NEGATIVE
Glucose, UA: NEGATIVE
Nitrite, UA: NEGATIVE
Spec Grav, UA: 1.02

## 2010-12-18 LAB — CBC WITH DIFFERENTIAL/PLATELET
Basophils Relative: 0.3 % (ref 0.0–3.0)
Eosinophils Relative: 2.8 % (ref 0.0–5.0)
HCT: 49.1 % (ref 39.0–52.0)
Lymphs Abs: 0.5 10*3/uL — ABNORMAL LOW (ref 0.7–4.0)
MCV: 98.7 fl (ref 78.0–100.0)
Monocytes Absolute: 0.5 10*3/uL (ref 0.1–1.0)
RBC: 4.97 Mil/uL (ref 4.22–5.81)
WBC: 6.1 10*3/uL (ref 4.5–10.5)

## 2010-12-18 LAB — HEMOGLOBIN A1C: Hgb A1c MFr Bld: 6.3 % (ref 4.6–6.5)

## 2010-12-18 LAB — HEPATIC FUNCTION PANEL
ALT: 46 U/L (ref 0–53)
Total Protein: 6.7 g/dL (ref 6.0–8.3)

## 2010-12-18 LAB — LIPID PANEL
Cholesterol: 123 mg/dL (ref 0–200)
Total CHOL/HDL Ratio: 3
Triglycerides: 55 mg/dL (ref 0.0–149.0)

## 2010-12-18 LAB — BASIC METABOLIC PANEL
BUN: 21 mg/dL (ref 6–23)
Chloride: 103 mEq/L (ref 96–112)
Potassium: 5.7 mEq/L — ABNORMAL HIGH (ref 3.5–5.1)

## 2010-12-18 LAB — TSH: TSH: 0.85 u[IU]/mL (ref 0.35–5.50)

## 2010-12-18 LAB — PSA: PSA: 1.24 ng/mL (ref 0.10–4.00)

## 2010-12-18 MED ORDER — NITROGLYCERIN 0.3 MG SL SUBL: 0.3000 mg | SUBLINGUAL_TABLET | SUBLINGUAL | Status: DC | PRN

## 2010-12-18 MED ORDER — METFORMIN HCL 500 MG PO TABS: 500.0000 mg | ORAL_TABLET | Freq: Every day | ORAL | Status: DC

## 2010-12-18 MED ORDER — METFORMIN HCL 1000 MG PO TABS: 1000.0000 mg | ORAL_TABLET | Freq: Every day | ORAL | Status: DC

## 2010-12-18 MED ORDER — TADALAFIL 10 MG PO TABS: 10.0000 mg | ORAL_TABLET | ORAL | Status: DC

## 2010-12-18 MED ORDER — SIMVASTATIN 20 MG PO TABS: 20.0000 mg | ORAL_TABLET | Freq: Every day | ORAL | Status: DC

## 2010-12-18 MED ORDER — TRIAMCINOLONE ACETONIDE 0.1 % EX CREA: TOPICAL_CREAM | CUTANEOUS | Status: DC | PRN

## 2010-12-18 MED ORDER — BD ULTRA-FINE LANCETS MISC: Status: DC

## 2010-12-18 MED ORDER — AMLODIPINE BESYLATE 10 MG PO TABS: 10.0000 mg | ORAL_TABLET | ORAL | Status: DC

## 2010-12-18 NOTE — Progress Notes (Signed)
Subjective:    Patient ID: Chase Anderson, male    DOB: [birth year], 65 y.o.   MRN: [id number]  HPI Chase Anderson  is a 65 year old Married male, nonsmoker, who comes in today for Medicare wellness examination because of a history of hypertension, atopic dermatitis, diabetes, hyperlipidemia, hypertension, leukopenia, unknown etiology, and coronary artery disease.  He takes Norvasc 10 mg daily for hypertension.  BP 112/74.  He takes Zocor 20 mg nightly along with an aspirin tablet....... check lipid panel today.  He takes metformin 1000 mg prior to breakfast and 500 mg prior to his evening meal.  Fasting blood sugars at home range around 110.  We will check A1c today.  For his rheumatoid arthritis.  He takes methotrexate at the direction of his rheumatologist.  Coronary artery disease.  Asymptomatic.  Due to see Dr. Daleen Squibb next week for cardiac evaluation.  He uses Cialis 10 mg p.r.n. For ED.  He uses Kenalog cream .1% p.r.n. For atopic dermatitis.  He sees Dr. Lurlean Leyden at [facility] yearly for evaluation of the leukopenia.  Tetanus booster 2004, Pneumovax 2006, seasonal flu shot today, he said shingles x 3, and a vaccination x 1.  He gets routine eye care, hearing normal, regular dental care, cognitive function, normal, activities of daily living.  Normal.  He walks on a regular basis.  Home health safety reviewed.  No issues identified, no guns in the house, he does have a health care power-of-attorney living will.   Review of Systems  Constitutional: Negative.   HENT: Negative.   Eyes: Negative.   Respiratory: Negative.   Cardiovascular: Negative.   Gastrointestinal: Negative.   Genitourinary: Negative.   Musculoskeletal: Negative.   Skin: Negative.   Neurological: Negative.   Hematological: Negative.   Psychiatric/Behavioral: Negative.        Objective:   Physical Exam  Constitutional: He is oriented to person, place, and time. He appears well-developed and  well-nourished.  HENT:  Head: Normocephalic and atraumatic.  Right Ear: External ear normal.  Left Ear: External ear normal.  Nose: Nose normal.  Mouth/Throat: Oropharynx is clear and moist.  Eyes: Conjunctivae and EOM are normal. Pupils are equal, round, and reactive to light.  Neck: Normal range of motion. Neck supple. No JVD present. No tracheal deviation present. No thyromegaly present.  Cardiovascular: Normal rate, regular rhythm, normal heart sounds and intact distal pulses.  Exam reveals no gallop and no friction rub.   No murmur heard. Pulmonary/Chest: Effort normal and breath sounds normal. No stridor. No respiratory distress. He has no wheezes. He has no rales. He exhibits no tenderness.  Abdominal: Soft. Bowel sounds are normal. He exhibits no distension and no mass. There is no tenderness. There is no rebound and no guarding.  Genitourinary: Rectum normal, prostate normal and penis normal. Guaiac negative stool. No penile tenderness.  Musculoskeletal: Normal range of motion. He exhibits no edema and no tenderness.  Lymphadenopathy:    He has no cervical adenopathy.  Neurological: He is alert and oriented to person, place, and time. He has normal reflexes. No cranial nerve deficit. He exhibits normal muscle tone.  Skin: Skin is warm and dry. No rash noted. No erythema. No pallor.  Psychiatric: He has a normal mood and affect. His behavior is normal. Judgment and thought content normal.          Assessment & Plan:  Type continue 10 mg daily.  Hyperlipidemia.  Continue the Zocor 20 mg daily an aspirin tablet.  Diabetes continue metformin  1000 mg prior to breakfast and 500 mg prior to evening meal.  Coronary disease.  Follow-up soon by Dr. Daleen Squibb.  Rheumatoid arthritis followed by Dr. Dareen Piano.  Leukopenia, unknown etiology, followed by Dr. Lurlean Leyden at [facility].  Eczema.  Triamcinolone cream p.r.n.  History of erectile dysfunction Cialis 10 mg p.r.n.  Seasonal flu shot  today

## 2010-12-18 NOTE — Patient Instructions (Signed)
Continue your current medications.  I will call you when I get her lab work back and we will discuss a follow-up on your diabetes.  Return in one year for general physical examination sooner if any problems

## 2010-12-24 ENCOUNTER — Ambulatory Visit: Payer: Medicare Other | Admitting: Family Medicine

## 2010-12-25 ENCOUNTER — Encounter: Payer: Self-pay | Admitting: Cardiology

## 2010-12-25 ENCOUNTER — Ambulatory Visit (INDEPENDENT_AMBULATORY_CARE_PROVIDER_SITE_OTHER): Payer: Medicare Other | Admitting: Cardiology

## 2010-12-25 VITALS — BP 112/64 | HR 77 | Ht 69.0 in | Wt 177.0 lb

## 2010-12-25 DIAGNOSIS — E875 Hyperkalemia: Secondary | ICD-10-CM

## 2010-12-25 DIAGNOSIS — I2510 Atherosclerotic heart disease of native coronary artery without angina pectoris: Secondary | ICD-10-CM

## 2010-12-25 DIAGNOSIS — I252 Old myocardial infarction: Secondary | ICD-10-CM

## 2010-12-25 DIAGNOSIS — I341 Nonrheumatic mitral (valve) prolapse: Secondary | ICD-10-CM | POA: Insufficient documentation

## 2010-12-25 DIAGNOSIS — R011 Cardiac murmur, unspecified: Secondary | ICD-10-CM

## 2010-12-25 LAB — BASIC METABOLIC PANEL
Calcium: 9.7 mg/dL (ref 8.4–10.5)
Creatinine, Ser: 1.2 mg/dL (ref 0.4–1.5)
GFR: 67.66 mL/min (ref >60.00)
Glucose, Bld: 152 mg/dL — ABNORMAL HIGH (ref 70–99)
Sodium: 136 mEq/L (ref 135–145)

## 2010-12-25 NOTE — Assessment & Plan Note (Signed)
Stable. No change in treatment unless he has significant left ventricular dysfunction on his echocardiogram.

## 2010-12-25 NOTE — Progress Notes (Signed)
HPI [name] returns today for his history of coronary artery disease, history of myocardial infarction, hypertension and hyperlipidemia.  He's having no angina or ischemic symptoms. He denies orthopnea, PND or edema.  Blood work from Dr. Tawanna Cooler showed his lipids to be at goal except for an HDL of 39. His hemoglobin A1c was 6.3%. Past Medical History  Diagnosis Date  . Myocardial infarction   . CAD (coronary artery disease)   . Hyperlipidemia   . Hypertension   . Fatigue   . Anxiety   . Diabetes mellitus     type II  . Osteoarthritis   . Erectile dysfunction   . Leukopenia   . Dermatitis     other atopic  . Allergic rhinitis     Past Surgical History  Procedure Date  . Esophagogastroduodenoscopy 04/23/2005  . Tonsillectomy   . Knee arthroscopy     right    No family history on file.  History   Social History  . Marital Status: Married    Spouse Name: N/A    Number of Children: N/A  . Years of Education: N/A   Occupational History  . Not on file.   Social History Main Topics  . Smoking status: Former Smoker -- 2 years    Quit date: 12/09/2001  . Smokeless tobacco: Not on file  . Alcohol Use: No  . Drug Use: No  . Sexually Active: Not on file   Other Topics Concern  . Not on file   Social History Narrative  . No narrative on file    Allergies  Allergen Reactions  . Codeine     REACTION: itching and rash  . Penicillins     REACTION: as a child    Current Outpatient Prescriptions  Medication Sig Dispense Refill  . aspirin 325 MG tablet Take 325 mg by mouth daily.        . BD ULTRA-FINE LANCETS lancets Use once daily for glucose control   100 each  3  . fluconazole (DIFLUCAN) 200 MG tablet Take 200 mg by mouth as needed.        . fluocinonide (LIDEX) 0.05 % cream USE AS DIRECTED  60 g  0  . fluocinonide (LIDEX) 0.05 % ointment Apply topically daily. Apply once daily as needed       . folic acid (FOLVITE) 1 MG tablet Take 1 mg by mouth 3 (three)  times daily.       Marland Kitchen glucose blood test strip 1 each by Other route daily. Use once daily for glucose readings        . metFORMIN (GLUCOPHAGE) 1000 MG tablet Take 1 tablet (1,000 mg total) by mouth daily with breakfast.  100 tablet  3  . metFORMIN (GLUCOPHAGE) 500 MG tablet Take 1 tablet (500 mg total) by mouth daily with supper.  100 tablet  3  . methotrexate 2.5 MG tablet Take 2.5 mg by mouth. Take 8  tablets once weekly      . niacin 500 MG tablet Take 500 mg by mouth daily. Take one tab once daily       . nitroGLYCERIN (NITROSTAT) 0.3 MG SL tablet Place 1 tablet (0.3 mg total) under the tongue as needed.  25 tablet  1  . simvastatin (ZOCOR) 20 MG tablet Take 1 tablet (20 mg total) by mouth at bedtime.  100 tablet  3  . sulfamethoxazole-trimethoprim (BACTRIM DS) 800-160 MG per tablet Take 1 tablet by mouth daily as needed.        Marland Kitchen  tadalafil (CIALIS) 10 MG tablet Take 1 tablet (10 mg total) by mouth as directed.  6 tablet  11  . triamcinolone (KENALOG) 0.1 % cream Apply topically as needed.  45 g  3  . amLODipine (NORVASC) 10 MG tablet Take 1 tablet (10 mg total) by mouth every morning.  100 tablet  3    ROS Negative other than HPI.   PE General Appearance: well developed, well nourished in no acute distress HEENT: symmetrical face, PERRLA, good dentition  Neck: no JVD, thyromegaly, or adenopathy, trachea midline Chest: symmetric without deformity Cardiac: PMI non-displaced, RRR, normal S1, S2, no gallop, soft systolic murmur at the apex and sometimes sounded midsystolic. Lung: clear to ausculation and percussion Vascular: all pulses full without bruits  Abdominal: nondistended, nontender, good bowel sounds, no HSM, no bruits Extremities: no cyanosis, clubbing or edema, no sign of DVT, no varicosities  Skin: normal color, no rashes Neuro: alert and oriented x 3, non-focal Pysch: normal affect Filed Vitals:   12/25/10 0905  BP: 112/64  Pulse: 77  Height: 5\' 9"  (1.753 m)  Weight:  177 lb (80.287 kg)    EKG In normal sinus rhythm with a first-degree AV block. Low voltage. Labs and Studies Reviewed.   Lab Results  Component Value Date   WBC 6.1 12/18/2010   HGB 16.4 12/18/2010   HCT 49.1 12/18/2010   MCV 98.7 12/18/2010   PLT 496.0* 12/18/2010      Chemistry      Component Value Date/Time   NA 138 12/18/2010 1029   K 5.7* 12/18/2010 1029   CL 103 12/18/2010 1029   CO2 28 12/18/2010 1029   BUN 21 12/18/2010 1029   CREATININE 1.1 12/18/2010 1029      Component Value Date/Time   CALCIUM 9.6 12/18/2010 1029   ALKPHOS 76 12/18/2010 1029   AST 26 12/18/2010 1029   ALT 46 12/18/2010 1029   BILITOT 1.1 12/18/2010 1029       Lab Results  Component Value Date   CHOL 123 12/18/2010   CHOL 107 12/07/2009   CHOL 108 11/09/2008   Lab Results  Component Value Date   HDL 39.80 12/18/2010   HDL 47.82* 12/07/2009   HDL 30.50* 11/09/2008   Lab Results  Component Value Date   LDLCALC 72 12/18/2010   LDLCALC 63 12/07/2009   LDLCALC 62 11/09/2008   Lab Results  Component Value Date   TRIG 55.0 12/18/2010   TRIG 48.0 12/07/2009   TRIG 79.0 11/09/2008   Lab Results  Component Value Date   CHOLHDL 3 12/18/2010   CHOLHDL 3 12/07/2009   CHOLHDL 4 11/09/2008   Lab Results  Component Value Date   HGBA1C 6.3 12/18/2010   Lab Results  Component Value Date   ALT 46 12/18/2010   AST 26 12/18/2010   ALKPHOS 76 12/18/2010   BILITOT 1.1 12/18/2010   Lab Results  Component Value Date   TSH 0.85 12/18/2010

## 2010-12-25 NOTE — Patient Instructions (Signed)
Your physician recommends that you have lab work today bmp.  We will call you with your results  Your physician has requested that you have an echocardiogram. Echocardiography is a painless test that uses sound waves to create images of your heart. It provides your doctor with information about the size and shape of your heart and how well your heart's chambers and valves are working. This procedure takes approximately one hour. There are no restrictions for this procedure.  Your physician wants you to follow-up in: 1 year with Dr. Daleen Squibb. You will receive a reminder letter in the mail two months in advance. If you don't receive a letter, please call our office to schedule the follow-up appointment.

## 2010-12-26 ENCOUNTER — Ambulatory Visit (HOSPITAL_COMMUNITY): Payer: Medicare Other | Attending: Cardiology

## 2010-12-26 DIAGNOSIS — I10 Essential (primary) hypertension: Secondary | ICD-10-CM | POA: Insufficient documentation

## 2010-12-26 DIAGNOSIS — I379 Nonrheumatic pulmonary valve disorder, unspecified: Secondary | ICD-10-CM | POA: Insufficient documentation

## 2010-12-26 DIAGNOSIS — R011 Cardiac murmur, unspecified: Secondary | ICD-10-CM

## 2010-12-26 DIAGNOSIS — I059 Rheumatic mitral valve disease, unspecified: Secondary | ICD-10-CM | POA: Insufficient documentation

## 2010-12-26 DIAGNOSIS — I079 Rheumatic tricuspid valve disease, unspecified: Secondary | ICD-10-CM | POA: Insufficient documentation

## 2010-12-26 DIAGNOSIS — I252 Old myocardial infarction: Secondary | ICD-10-CM | POA: Insufficient documentation

## 2010-12-26 DIAGNOSIS — E119 Type 2 diabetes mellitus without complications: Secondary | ICD-10-CM | POA: Insufficient documentation

## 2010-12-26 DIAGNOSIS — I2510 Atherosclerotic heart disease of native coronary artery without angina pectoris: Secondary | ICD-10-CM | POA: Insufficient documentation

## 2010-12-26 DIAGNOSIS — E785 Hyperlipidemia, unspecified: Secondary | ICD-10-CM | POA: Insufficient documentation

## 2010-12-27 ENCOUNTER — Telehealth: Payer: Self-pay | Admitting: *Deleted

## 2010-12-27 MED ORDER — LISINOPRIL 10 MG PO TABS: 10.0000 mg | ORAL_TABLET | Freq: Every day | ORAL | Status: DC

## 2010-12-27 NOTE — Telephone Encounter (Signed)
Message copied by Theda Belfast on Thu Dec 27, 2010  4:59 PM ------      Message from: Valera [place] C      Created: Thu Dec 27, 2010  2:06 PM       He has mitral valve prolapse the source of his murmur. At this to his problems. Would add ACE inhibitor in the form of lisinopril 10 mg per day with his diabetes and LVH. Followup with me in one year.

## 2010-12-27 NOTE — Telephone Encounter (Signed)
Pt aware of echo results and MVP. He will start Lisinopril tomorrow.  He is feeling better today. [name] RN

## 2010-12-31 ENCOUNTER — Encounter: Payer: Self-pay | Admitting: *Deleted

## 2010-12-31 ENCOUNTER — Telehealth: Payer: Self-pay | Admitting: Cardiology

## 2010-12-31 NOTE — Telephone Encounter (Signed)
Pt will be starting new prescription of Lisinopril and after talking with his pharmacist had some concerns b/c he is also taking amlodipine. Discussed the differences between the 2 medications. Pt will keep a bp log and call the office if he experience side effects. Pt understands and reassurance given. [name] RN

## 2010-12-31 NOTE — Telephone Encounter (Signed)
Pt calling with questions about his lisinopril. Please call back.

## 2011-02-01 ENCOUNTER — Other Ambulatory Visit: Payer: Self-pay | Admitting: Family Medicine

## 2011-02-04 ENCOUNTER — Telehealth: Payer: Self-pay | Admitting: Cardiology

## 2011-02-04 NOTE — Telephone Encounter (Signed)
New Msg: Pt calling stating that new medication MD put pt on, lisinopril has negative side effects and pt thinks he might need to stop taking it. Please return pt call to discuss further.

## 2011-02-04 NOTE — Telephone Encounter (Signed)
Pt calls with increased fatigue and joint aches. Pt says that he was walking a mile daily until he started taking the Lisinopril but has been unable to do so.   Pt stopped Lisinopril yesterday. Joint aches and fatigue much improved. Blood pressure was 120/70.  He will discontinue Lisinopril. Will send to Dr. Daleen Squibb for review. Will call pt back if medication changes made. Pt agrees with plan. [name] RN

## 2011-02-05 NOTE — Telephone Encounter (Signed)
List as intolerance. Keep check on BP.

## 2011-02-05 NOTE — Telephone Encounter (Signed)
List as intolerance and keep watch on BP.

## 2011-02-06 NOTE — Telephone Encounter (Signed)
Lisinopril listed as an intolerance Westly Pam

## 2011-02-06 NOTE — Telephone Encounter (Signed)
Pt will call back if any bp issues occur. He is doing well today. [name] RN

## 2011-02-26 ENCOUNTER — Other Ambulatory Visit: Payer: Self-pay | Admitting: Family Medicine

## 2011-03-15 ENCOUNTER — Other Ambulatory Visit: Payer: Self-pay | Admitting: Family Medicine

## 2011-04-05 ENCOUNTER — Other Ambulatory Visit: Payer: Self-pay | Admitting: Family Medicine

## 2011-10-15 ENCOUNTER — Other Ambulatory Visit: Payer: Self-pay | Admitting: Family Medicine

## 2011-12-24 ENCOUNTER — Other Ambulatory Visit: Payer: Self-pay | Admitting: Family Medicine

## 2011-12-25 ENCOUNTER — Other Ambulatory Visit: Payer: Self-pay | Admitting: *Deleted

## 2011-12-25 ENCOUNTER — Ambulatory Visit: Payer: Medicare Other | Admitting: Cardiology

## 2011-12-25 DIAGNOSIS — L2089 Other atopic dermatitis: Secondary | ICD-10-CM

## 2011-12-25 MED ORDER — TRIAMCINOLONE ACETONIDE 0.1 % EX CREA: TOPICAL_CREAM | CUTANEOUS | Status: DC | PRN

## 2011-12-25 MED ORDER — GLUCOSE BLOOD VI STRP: 1.0000 | ORAL_STRIP | Freq: Every day | Status: DC

## 2012-01-08 ENCOUNTER — Encounter: Payer: Self-pay | Admitting: Cardiology

## 2012-01-08 ENCOUNTER — Ambulatory Visit (INDEPENDENT_AMBULATORY_CARE_PROVIDER_SITE_OTHER): Payer: Medicare Other | Admitting: Cardiology

## 2012-01-08 VITALS — BP 122/64 | HR 82 | Ht 71.0 in | Wt 185.0 lb

## 2012-01-08 DIAGNOSIS — I10 Essential (primary) hypertension: Secondary | ICD-10-CM

## 2012-01-08 DIAGNOSIS — I341 Nonrheumatic mitral (valve) prolapse: Secondary | ICD-10-CM

## 2012-01-08 DIAGNOSIS — E785 Hyperlipidemia, unspecified: Secondary | ICD-10-CM

## 2012-01-08 DIAGNOSIS — I252 Old myocardial infarction: Secondary | ICD-10-CM

## 2012-01-08 DIAGNOSIS — I059 Rheumatic mitral valve disease, unspecified: Secondary | ICD-10-CM

## 2012-01-08 DIAGNOSIS — I2510 Atherosclerotic heart disease of native coronary artery without angina pectoris: Secondary | ICD-10-CM

## 2012-01-08 NOTE — Patient Instructions (Signed)
Follow-up with your primary care doctor as scheduled and have your fasting lab work including liver function panel drawn.  Your physician recommends that you continue on your current medications as directed. Please refer to the Current Medication list given to you today.  Your physician wants you to follow-up in: 1 year with Dr. Daleen Squibb. You will receive a reminder letter in the mail two months in advance. If you don't receive a letter, please call our office to schedule the follow-up appointment.

## 2012-01-08 NOTE — Assessment & Plan Note (Signed)
Stable. Continue secondary preventative therapy. 

## 2012-01-08 NOTE — Assessment & Plan Note (Signed)
Stable and asymptomatic by history and exam.

## 2012-01-08 NOTE — Progress Notes (Signed)
HPI Chase Anderson returns today for evaluation and management his coronary artery disease and history of myocardial infarction.  He cannot tolerate lisinopril which I initiated secondary to muscle aches in extreme fatigue. He felt his blood pressure was low.  Past Medical History  Diagnosis Date  . Myocardial infarction   . CAD (coronary artery disease)   . Hyperlipidemia   . Hypertension   . Fatigue   . Anxiety   . Diabetes mellitus     type II  . Osteoarthritis   . Erectile dysfunction   . Leukopenia   . Dermatitis     other atopic  . Allergic rhinitis     Current Outpatient Prescriptions  Medication Sig Dispense Refill  . amLODipine (NORVASC) 10 MG tablet TAKE 1 TABLET BY MOUTH EVERY MORNING  100 tablet  0  . aspirin 325 MG tablet Take 325 mg by mouth daily.        . BD ULTRA-FINE LANCETS lancets Use once daily for glucose control   100 each  3  . clindamycin (CLEOCIN) 300 MG capsule Take 300 mg by mouth as needed.      . etanercept (ENBREL) 50 MG/ML injection Inject 50 mg into the skin once a week.      . fluconazole (DIFLUCAN) 100 MG tablet Take 100 mg by mouth daily.      . fluocinonide (LIDEX) 0.05 % ointment Apply topically daily. Apply once daily as needed       . glucose blood (ACCU-CHEK AVIVA PLUS) test strip 1 each by Other route daily.  100 each  3  . metFORMIN (GLUCOPHAGE) 1000 MG tablet Take 1 tablet (1,000 mg total) by mouth daily with breakfast.  100 tablet  3  . metFORMIN (GLUCOPHAGE) 500 MG tablet       . niacin 500 MG tablet Take 500 mg by mouth daily. Take one tab once daily       . nitroGLYCERIN (NITROSTAT) 0.3 MG SL tablet Place 1 tablet (0.3 mg total) under the tongue as needed.  25 tablet  1  . simvastatin (ZOCOR) 20 MG tablet Take 1 tablet (20 mg total) by mouth at bedtime.  100 tablet  3  . sulfamethoxazole-trimethoprim (BACTRIM DS) 800-160 MG per tablet Take 1 tablet by mouth 3 (three) times a week.       . tadalafil (CIALIS) 10 MG tablet Take 1 tablet  (10 mg total) by mouth as directed.  6 tablet  11  . triamcinolone cream (KENALOG) 0.1 % Apply topically as needed.  45 g  3  . valACYclovir (VALTREX) 500 MG tablet Take 500 mg by mouth as needed.      Marland Kitchen DISCONTD: metFORMIN (GLUCOPHAGE) 500 MG tablet TAKE 1 TABLET BY MOUTH TWICE DAILY  200 tablet  0    Allergies  Allergen Reactions  . Codeine     REACTION: itching and rash  . Lisinopril   . Penicillins     REACTION: as a child    No family history on file.  History   Social History  . Marital Status: Married    Spouse Name: N/A    Number of Children: N/A  . Years of Education: N/A   Occupational History  . Not on file.   Social History Main Topics  . Smoking status: Former Smoker -- 2 years    Quit date: 12/09/2001  . Smokeless tobacco: Not on file  . Alcohol Use: No  . Drug Use: No  . Sexually Active: Not  on file   Other Topics Concern  . Not on file   Social History Narrative  . No narrative on file    ROS ALL NEGATIVE EXCEPT THOSE NOTED IN HPI  PE  General Appearance: well developed, well nourished in no acute distress HEENT: symmetrical face, PERRLA, good dentition  Neck: no JVD, thyromegaly, or adenopathy, trachea midline Chest: symmetric without deformity Cardiac: PMI non-displaced, RRR, normal S1, S2, no gallop, soft systolic murmur at the apex, no click Lung: clear to ausculation and percussion Vascular: all pulses full without bruits  Abdominal: nondistended, nontender, good bowel sounds, no HSM, no bruits Extremities: no cyanosis, clubbing or edema, no sign of DVT, no varicosities  Skin: normal color, no rashes Neuro: alert and oriented x 3, non-focal Pysch: normal affect  EKG Normal sinus rhythm with first-degree AV block and PVCs left axis deviation, no acute changes BMET    Component Value Date/Time   NA 136 12/25/2010 0953   K 4.1 12/25/2010 0953   CL 104 12/25/2010 0953   CO2 25 12/25/2010 0953   GLUCOSE 152* 12/25/2010 0953   BUN  21 12/25/2010 0953   CREATININE 1.2 12/25/2010 0953   CALCIUM 9.7 12/25/2010 0953   GFRNONAA 62.81 12/07/2009 0749   GFRAA 87 11/17/2007 1131    Lipid Panel     Component Value Date/Time   CHOL 123 12/18/2010 1029   TRIG 55.0 12/18/2010 1029   HDL 39.80 12/18/2010 1029   CHOLHDL 3 12/18/2010 1029   VLDL 11.0 12/18/2010 1029   LDLCALC 72 12/18/2010 1029    CBC    Component Value Date/Time   WBC 6.1 12/18/2010 1029   RBC 4.97 12/18/2010 1029   HGB 16.4 12/18/2010 1029   HCT 49.1 12/18/2010 1029   PLT 496.0* 12/18/2010 1029   MCV 98.7 12/18/2010 1029   MCHC 33.5 12/18/2010 1029   RDW 13.5 12/18/2010 1029   LYMPHSABS 0.5* 12/18/2010 1029   MONOABS 0.5 12/18/2010 1029   EOSABS 0.2 12/18/2010 1029   BASOSABS 0.0 12/18/2010 1029

## 2012-01-08 NOTE — Assessment & Plan Note (Signed)
He is to have his blood work checked by Dr. Tawanna Cooler within the next 2 weeks. His LFT should be monitored closely with him being on fluconazole, simvastatin, and amlodipine in combination.

## 2012-01-14 ENCOUNTER — Other Ambulatory Visit: Payer: Self-pay | Admitting: *Deleted

## 2012-01-14 DIAGNOSIS — E119 Type 2 diabetes mellitus without complications: Secondary | ICD-10-CM

## 2012-01-14 MED ORDER — METFORMIN HCL 1000 MG PO TABS: 1000.0000 mg | ORAL_TABLET | Freq: Every day | ORAL | Status: DC

## 2012-01-23 ENCOUNTER — Ambulatory Visit (INDEPENDENT_AMBULATORY_CARE_PROVIDER_SITE_OTHER): Payer: Medicare Other | Admitting: Family Medicine

## 2012-01-23 ENCOUNTER — Encounter: Payer: Self-pay | Admitting: Family Medicine

## 2012-01-23 VITALS — BP 110/70 | Temp 98.5°F | Ht 71.25 in | Wt 189.0 lb

## 2012-01-23 DIAGNOSIS — L2089 Other atopic dermatitis: Secondary | ICD-10-CM

## 2012-01-23 DIAGNOSIS — N139 Obstructive and reflux uropathy, unspecified: Secondary | ICD-10-CM

## 2012-01-23 DIAGNOSIS — F528 Other sexual dysfunction not due to a substance or known physiological condition: Secondary | ICD-10-CM

## 2012-01-23 DIAGNOSIS — N401 Enlarged prostate with lower urinary tract symptoms: Secondary | ICD-10-CM

## 2012-01-23 DIAGNOSIS — I2510 Atherosclerotic heart disease of native coronary artery without angina pectoris: Secondary | ICD-10-CM

## 2012-01-23 DIAGNOSIS — M1990 Unspecified osteoarthritis, unspecified site: Secondary | ICD-10-CM

## 2012-01-23 DIAGNOSIS — E119 Type 2 diabetes mellitus without complications: Secondary | ICD-10-CM

## 2012-01-23 DIAGNOSIS — N138 Other obstructive and reflux uropathy: Secondary | ICD-10-CM

## 2012-01-23 DIAGNOSIS — I059 Rheumatic mitral valve disease, unspecified: Secondary | ICD-10-CM

## 2012-01-23 DIAGNOSIS — Z87898 Personal history of other specified conditions: Secondary | ICD-10-CM

## 2012-01-23 DIAGNOSIS — Z0000 Encounter for general adult medical examination without abnormal findings: Secondary | ICD-10-CM

## 2012-01-23 DIAGNOSIS — E785 Hyperlipidemia, unspecified: Secondary | ICD-10-CM

## 2012-01-23 DIAGNOSIS — I252 Old myocardial infarction: Secondary | ICD-10-CM

## 2012-01-23 DIAGNOSIS — I341 Nonrheumatic mitral (valve) prolapse: Secondary | ICD-10-CM

## 2012-01-23 DIAGNOSIS — Z23 Encounter for immunization: Secondary | ICD-10-CM

## 2012-01-23 DIAGNOSIS — D72819 Decreased white blood cell count, unspecified: Secondary | ICD-10-CM

## 2012-01-23 DIAGNOSIS — I10 Essential (primary) hypertension: Secondary | ICD-10-CM

## 2012-01-23 LAB — MICROALBUMIN / CREATININE URINE RATIO
Microalb Creat Ratio: 2.7 mg/g (ref 0.0–30.0)
Microalb, Ur: 3 mg/dL — ABNORMAL HIGH (ref 0.0–1.9)

## 2012-01-23 LAB — CBC WITH DIFFERENTIAL/PLATELET
Basophils Relative: 0.3 % (ref 0.0–3.0)
Eosinophils Absolute: 0.1 10*3/uL (ref 0.0–0.7)
Hemoglobin: 15.8 g/dL (ref 13.0–17.0)
Lymphocytes Relative: 8.8 % — ABNORMAL LOW (ref 12.0–46.0)
Monocytes Relative: 9 % (ref 3.0–12.0)
Neutro Abs: 5.6 10*3/uL (ref 1.4–7.7)
Neutrophils Relative %: 80 % — ABNORMAL HIGH (ref 43.0–77.0)
RBC: 4.91 Mil/uL (ref 4.22–5.81)
WBC: 7.1 10*3/uL (ref 4.5–10.5)

## 2012-01-23 LAB — POCT URINALYSIS DIPSTICK
Bilirubin, UA: NEGATIVE
Blood, UA: NEGATIVE
Glucose, UA: NEGATIVE
Nitrite, UA: NEGATIVE
Spec Grav, UA: 1.015
Urobilinogen, UA: 0.2

## 2012-01-23 LAB — LIPID PANEL
Cholesterol: 113 mg/dL (ref 0–200)
HDL: 37.7 mg/dL — ABNORMAL LOW (ref >39.00)
LDL Cholesterol: 60 mg/dL (ref 0–99)
Total CHOL/HDL Ratio: 3
Triglycerides: 75 mg/dL (ref 0.0–149.0)
VLDL: 15 mg/dL (ref 0.0–40.0)

## 2012-01-23 LAB — BASIC METABOLIC PANEL
CO2: 24 mEq/L (ref 19–32)
Calcium: 9.3 mg/dL (ref 8.4–10.5)
Creatinine, Ser: 1.2 mg/dL (ref 0.4–1.5)
GFR: 64.2 mL/min (ref >60.00)
Glucose, Bld: 140 mg/dL — ABNORMAL HIGH (ref 70–99)
Sodium: 134 mEq/L — ABNORMAL LOW (ref 135–145)

## 2012-01-23 LAB — HEPATIC FUNCTION PANEL
Albumin: 4.5 g/dL (ref 3.5–5.2)
Total Protein: 6.6 g/dL (ref 6.0–8.3)

## 2012-01-23 LAB — TSH: TSH: 1.15 u[IU]/mL (ref 0.35–5.50)

## 2012-01-23 LAB — HEMOGLOBIN A1C: Hgb A1c MFr Bld: 6.2 % (ref 4.6–6.5)

## 2012-01-23 LAB — PSA: PSA: 0.7 ng/mL (ref 0.10–4.00)

## 2012-01-23 MED ORDER — GLUCOSE BLOOD VI STRP: 1.0000 | ORAL_STRIP | Freq: Every day | Status: DC

## 2012-01-23 MED ORDER — SIMVASTATIN 20 MG PO TABS: 20.0000 mg | ORAL_TABLET | Freq: Every day | ORAL | Status: DC

## 2012-01-23 MED ORDER — METFORMIN HCL 500 MG PO TABS: ORAL_TABLET | ORAL | Status: DC

## 2012-01-23 MED ORDER — FLUOCINONIDE 0.05 % EX OINT: TOPICAL_OINTMENT | Freq: Every day | CUTANEOUS | Status: DC

## 2012-01-23 MED ORDER — BD ULTRA-FINE LANCETS MISC: Status: DC

## 2012-01-23 MED ORDER — NITROGLYCERIN 0.3 MG SL SUBL: 0.3000 mg | SUBLINGUAL_TABLET | SUBLINGUAL | Status: DC | PRN

## 2012-01-23 MED ORDER — METFORMIN HCL 1000 MG PO TABS: 1000.0000 mg | ORAL_TABLET | Freq: Every day | ORAL | Status: DC

## 2012-01-23 MED ORDER — TADALAFIL 20 MG PO TABS: 10.0000 mg | ORAL_TABLET | ORAL | Status: DC | PRN

## 2012-01-23 MED ORDER — AMLODIPINE BESYLATE 10 MG PO TABS: 10.0000 mg | ORAL_TABLET | Freq: Every day | ORAL | Status: DC

## 2012-01-23 MED ORDER — TRIAMCINOLONE ACETONIDE 0.1 % EX CREA: TOPICAL_CREAM | CUTANEOUS | Status: DC | PRN

## 2012-01-23 NOTE — Patient Instructions (Signed)
Continue your current medications  We will call you with your lab reports  Return in one year sooner if any problems

## 2012-01-23 NOTE — Progress Notes (Signed)
  Subjective:    Patient ID: Chase Anderson, male    DOB: 1945-03-15, 66 y.o.   MRN: [id number]  HPI Chase Anderson is a 66 year old married male nonsmoker who comes in today for a Medicare wellness examination because of a history of hypertension, diabetes type 2, atopic dermatitis, coronary disease, hyperlipidemia, erectile dysfunction, recurrent HSV 1, neutropenia unknown etiology and rheumatoid arthritis  His medications reviewed in detail and there've been no changes  He saw his rheumatologist Dr. Dareen Piano 3 months ago. He takes embryonal weekly  He also saw Dr. Lurlean Leyden at [facility]. He takes Septra DS one tablet 3 times weekly  He gets routine eye care, dental care, colonoscopy and GI, tetanus 2004, Pneumovax 2006, shingles 2010, seasonal flu shot today.  Cognitive function normal he walks on a regular basis home health safety reviewed no issues identified, no guns in the house, he does have a health care power of attorney and living will  He also saw his cardiologist Dr. wall exam was normal EKG normal   Review of Systems  Constitutional: Negative.   HENT: Negative.   Eyes: Negative.   Respiratory: Negative.   Cardiovascular: Negative.   Gastrointestinal: Negative.   Genitourinary: Negative.   Musculoskeletal: Negative.   Skin: Negative.   Neurological: Negative.   Hematological: Negative.   Psychiatric/Behavioral: Negative.        Objective:   Physical Exam  Vitals reviewed. Constitutional: He is oriented to person, place, and time. He appears well-developed and well-nourished.  HENT:  Head: Normocephalic and atraumatic.  Right Ear: External ear normal.  Left Ear: External ear normal.  Nose: Nose normal.  Mouth/Throat: Oropharynx is clear and moist.  Eyes: Conjunctivae normal and EOM are normal. Pupils are equal, round, and reactive to light.  Neck: Normal range of motion. Neck supple. No JVD present. No tracheal deviation present. No thyromegaly present.    Cardiovascular: Normal rate, regular rhythm, normal heart sounds and intact distal pulses.  Exam reveals no gallop and no friction rub.   No murmur heard. Pulmonary/Chest: Effort normal and breath sounds normal. No stridor. No respiratory distress. He has no wheezes. He has no rales. He exhibits no tenderness.  Abdominal: Soft. Bowel sounds are normal. He exhibits no distension and no mass. There is no tenderness. There is no rebound and no guarding.  Genitourinary: Rectum normal, prostate normal and penis normal. Guaiac negative stool. No penile tenderness.  Musculoskeletal: Normal range of motion. He exhibits no edema and no tenderness.  Lymphadenopathy:    He has no cervical adenopathy.  Neurological: He is alert and oriented to person, place, and time. He has normal reflexes. No cranial nerve deficit. He exhibits normal muscle tone.  Skin: Skin is warm and dry. No rash noted. No erythema. No pallor.  Psychiatric: He has a normal mood and affect. His behavior is normal. Judgment and thought content normal.          Assessment & Plan:   healthy male  Diabetes type 2 check labs  Hypertension at goal continue current therapy  Coronary disease asymptomatic  Rheumatoid arthritis continue embryo  Hyperlipidemia continue Zocor 20 mg daily  Erectile dysfunction continue Cialis 20 mg when necessary  Dyshidrotic eczema continue combination of Lidex and Kenalog  Recurrent HSV 1 Doubtrex when necessary  Leukopenia unknown etiology continue followup by Dr. Lurlean Leyden and [facility] and Septra DS one tablet 3 times weekly.

## 2012-04-10 ENCOUNTER — Other Ambulatory Visit: Payer: Self-pay | Admitting: *Deleted

## 2012-04-10 DIAGNOSIS — E119 Type 2 diabetes mellitus without complications: Secondary | ICD-10-CM

## 2012-04-10 MED ORDER — METFORMIN HCL 500 MG PO TABS: ORAL_TABLET | ORAL | Status: DC

## 2012-05-12 ENCOUNTER — Other Ambulatory Visit: Payer: Self-pay | Admitting: Family Medicine

## 2012-10-06 ENCOUNTER — Telehealth: Payer: Self-pay | Admitting: Internal Medicine

## 2012-10-06 NOTE — Telephone Encounter (Signed)
Procedure from 2010 indicated a 7 year recall and that is what is in the system I have left a message for him to call back

## 2012-10-06 NOTE — Telephone Encounter (Signed)
Patient states that we sent him a letter about a month ago.  I do not see a letter in the system. He is going to go home to find the letter an he says he'll call me back.

## 2012-10-07 NOTE — Telephone Encounter (Signed)
Patient can only find a letter that was sent to his wife.  He is advised of recall date for 2017.  Per the system and procedure report from 2010.  He has no changes in his health, he will call back for GI concerns

## 2012-11-19 DIAGNOSIS — D8489 Other immunodeficiencies: Secondary | ICD-10-CM | POA: Insufficient documentation

## 2012-11-25 DIAGNOSIS — B379 Candidiasis, unspecified: Secondary | ICD-10-CM | POA: Insufficient documentation

## 2012-12-07 ENCOUNTER — Encounter: Payer: Self-pay | Admitting: Family Medicine

## 2013-01-13 ENCOUNTER — Other Ambulatory Visit: Payer: Self-pay | Admitting: Family Medicine

## 2013-01-25 ENCOUNTER — Encounter: Payer: Self-pay | Admitting: Family Medicine

## 2013-01-25 ENCOUNTER — Ambulatory Visit (INDEPENDENT_AMBULATORY_CARE_PROVIDER_SITE_OTHER): Payer: Medicare Other | Admitting: Family Medicine

## 2013-01-25 VITALS — BP 130/88 | Temp 98.2°F | Ht 71.25 in | Wt 185.0 lb

## 2013-01-25 DIAGNOSIS — L2089 Other atopic dermatitis: Secondary | ICD-10-CM

## 2013-01-25 DIAGNOSIS — I2510 Atherosclerotic heart disease of native coronary artery without angina pectoris: Secondary | ICD-10-CM

## 2013-01-25 DIAGNOSIS — I341 Nonrheumatic mitral (valve) prolapse: Secondary | ICD-10-CM

## 2013-01-25 DIAGNOSIS — D72819 Decreased white blood cell count, unspecified: Secondary | ICD-10-CM

## 2013-01-25 DIAGNOSIS — L4050 Arthropathic psoriasis, unspecified: Secondary | ICD-10-CM

## 2013-01-25 DIAGNOSIS — I252 Old myocardial infarction: Secondary | ICD-10-CM

## 2013-01-25 DIAGNOSIS — Z87898 Personal history of other specified conditions: Secondary | ICD-10-CM

## 2013-01-25 DIAGNOSIS — E119 Type 2 diabetes mellitus without complications: Secondary | ICD-10-CM

## 2013-01-25 DIAGNOSIS — J309 Allergic rhinitis, unspecified: Secondary | ICD-10-CM

## 2013-01-25 DIAGNOSIS — F528 Other sexual dysfunction not due to a substance or known physiological condition: Secondary | ICD-10-CM

## 2013-01-25 DIAGNOSIS — I059 Rheumatic mitral valve disease, unspecified: Secondary | ICD-10-CM

## 2013-01-25 DIAGNOSIS — I10 Essential (primary) hypertension: Secondary | ICD-10-CM

## 2013-01-25 DIAGNOSIS — E785 Hyperlipidemia, unspecified: Secondary | ICD-10-CM

## 2013-01-25 LAB — LIPID PANEL: HDL: 37.4 mg/dL — ABNORMAL LOW (ref >39.00)

## 2013-01-25 LAB — CBC WITH DIFFERENTIAL/PLATELET
Basophils Absolute: 0 10*3/uL (ref 0.0–0.1)
Eosinophils Relative: 3.1 % (ref 0.0–5.0)
HCT: 48.6 % (ref 39.0–52.0)
Hemoglobin: 16.8 g/dL (ref 13.0–17.0)
Lymphs Abs: 0.8 10*3/uL (ref 0.7–4.0)
MCHC: 34.5 g/dL (ref 30.0–36.0)
MCV: 93.3 fl (ref 78.0–100.0)
Monocytes Absolute: 0.7 10*3/uL (ref 0.1–1.0)
Monocytes Relative: 9.1 % (ref 3.0–12.0)
Neutro Abs: 5.7 10*3/uL (ref 1.4–7.7)
Neutrophils Relative %: 76.5 % (ref 43.0–77.0)
Platelets: 699 10*3/uL — ABNORMAL HIGH (ref 150.0–400.0)
RDW: 12.8 % (ref 11.5–14.6)

## 2013-01-25 LAB — POCT URINALYSIS DIPSTICK
Glucose, UA: NEGATIVE
Ketones, UA: NEGATIVE
Leukocytes, UA: NEGATIVE
Nitrite, UA: NEGATIVE
pH, UA: 5.5

## 2013-01-25 LAB — HEPATIC FUNCTION PANEL
ALT: 25 U/L (ref 0–53)
AST: 22 U/L (ref 0–37)
Bilirubin, Direct: 0.1 mg/dL (ref 0.0–0.3)
Total Bilirubin: 0.8 mg/dL (ref 0.3–1.2)
Total Protein: 7.1 g/dL (ref 6.0–8.3)

## 2013-01-25 LAB — HEMOGLOBIN A1C: Hgb A1c MFr Bld: 6.5 % (ref 4.6–6.5)

## 2013-01-25 LAB — BASIC METABOLIC PANEL
CO2: 24 mEq/L (ref 19–32)
Chloride: 102 mEq/L (ref 96–112)
Creatinine, Ser: 1.1 mg/dL (ref 0.4–1.5)
Potassium: 5 mEq/L (ref 3.5–5.1)

## 2013-01-25 LAB — PSA: PSA: 0.69 ng/mL (ref 0.10–4.00)

## 2013-01-25 LAB — MICROALBUMIN / CREATININE URINE RATIO: Microalb Creat Ratio: 3.8 mg/g (ref 0.0–30.0)

## 2013-01-25 LAB — TSH: TSH: 1.48 u[IU]/mL (ref 0.35–5.50)

## 2013-01-25 MED ORDER — AMLODIPINE BESYLATE 10 MG PO TABS: 10.0000 mg | ORAL_TABLET | Freq: Every day | ORAL | Status: DC

## 2013-01-25 MED ORDER — METFORMIN HCL 500 MG PO TABS: ORAL_TABLET | ORAL | Status: DC

## 2013-01-25 MED ORDER — GLUCOSE BLOOD VI STRP: 1.0000 | ORAL_STRIP | Freq: Every day | Status: DC

## 2013-01-25 MED ORDER — METFORMIN HCL 1000 MG PO TABS: ORAL_TABLET | ORAL | Status: DC

## 2013-01-25 MED ORDER — TRIAMCINOLONE ACETONIDE 0.1 % EX CREA: TOPICAL_CREAM | CUTANEOUS | Status: DC | PRN

## 2013-01-25 MED ORDER — NITROGLYCERIN 0.3 MG SL SUBL: 0.3000 mg | SUBLINGUAL_TABLET | SUBLINGUAL | Status: DC | PRN

## 2013-01-25 MED ORDER — TADALAFIL 20 MG PO TABS: 10.0000 mg | ORAL_TABLET | ORAL | Status: DC | PRN

## 2013-01-25 MED ORDER — BD ULTRA-FINE LANCETS MISC: Status: DC

## 2013-01-25 MED ORDER — SIMVASTATIN 20 MG PO TABS: 20.0000 mg | ORAL_TABLET | Freq: Every day | ORAL | Status: DC

## 2013-01-25 MED ORDER — FLUOCINONIDE 0.05 % EX OINT: TOPICAL_OINTMENT | Freq: Every day | CUTANEOUS | Status: DC

## 2013-01-25 MED ORDER — VALACYCLOVIR HCL 500 MG PO TABS: 500.0000 mg | ORAL_TABLET | ORAL | Status: DC | PRN

## 2013-01-25 NOTE — Patient Instructions (Addendum)
Continue your current medications  I will call you when we gets her lab work back to review the results and schedule followup  [email or web address] for the Cialis

## 2013-01-25 NOTE — Progress Notes (Signed)
  Subjective:    Patient ID: Chase Anderson, male    DOB: 01/14/1946, 67 y.o.   MRN: [id number]  HPI Chase Anderson is a 67 year old married male nonsmoker who comes in today for a Medicare wellness examination because of a history of hypertension, diabetes type 2, coronary disease, leukopenia unknown etiology followed in [facility], also treated here by rheumatologist Dr. Malva Limes. He also has hyperlipidemia erectile dysfunction eczema 6 an occasional outbreak of HSV 1.  He gets routine eye care, dental care, colonoscopy, its vaccinations up-to-date except these do a Pneumovax and a shingles vaccine 6  Cognitive function normal he walks on a regular basis home health safety reviewed no issues identified, no guns in the house, he does have a health care power of attorney and living well    Review of Systems  Constitutional: Negative.   HENT: Negative.   Eyes: Negative.   Respiratory: Negative.   Cardiovascular: Negative.   Gastrointestinal: Negative.   Endocrine: Negative.   Genitourinary: Negative.   Musculoskeletal: Negative.   Skin: Negative.   Allergic/Immunologic: Negative.   Neurological: Negative.   Hematological: Negative.   Psychiatric/Behavioral: Negative.        Objective:   Physical Exam  Nursing note and vitals reviewed. Constitutional: He is oriented to person, place, and time. He appears well-developed and well-nourished.  HENT:  Head: Normocephalic and atraumatic.  Right Ear: External ear normal.  Left Ear: External ear normal.  Nose: Nose normal.  Mouth/Throat: Oropharynx is clear and moist.  Eyes: Conjunctivae and EOM are normal. Pupils are equal, round, and reactive to light.  Neck: Normal range of motion. Neck supple. No JVD present. No tracheal deviation present. No thyromegaly present.  Cardiovascular: Normal rate, regular rhythm, normal heart sounds and intact distal pulses.  Exam reveals no gallop and no friction rub.   No murmur heard. No carotid  nor aortic bruits peripheral pulses 2+ and symmetrical  Pulmonary/Chest: Effort normal and breath sounds normal. No stridor. No respiratory distress. He has no wheezes. He has no rales. He exhibits no tenderness.  Abdominal: Soft. Bowel sounds are normal. He exhibits no distension and no mass. There is no tenderness. There is no rebound and no guarding.  Genitourinary: Rectum normal, prostate normal and penis normal. Guaiac negative stool. No penile tenderness.  Musculoskeletal: Normal range of motion. He exhibits no edema and no tenderness.  Lymphadenopathy:    He has no cervical adenopathy.  Neurological: He is alert and oriented to person, place, and time. He has normal reflexes. No cranial nerve deficit. He exhibits normal muscle tone.  Skin: Skin is warm and dry. No rash noted. No erythema. No pallor.  Psychiatric: He has a normal mood and affect. His behavior is normal. Judgment and thought content normal.          Assessment & Plan:  Diabetes type 2 check labs  Hypertension at goal continue current therapy  Hyperlipidemia check labs psoriatic arthritis followup by Dr. Dareen Piano  Immune complex disease etiology unknown followup in [facility] 67  Coronary disease asymptomatic 67  Erectile dysfunction continue Cialis

## 2013-01-25 NOTE — Progress Notes (Signed)
Pre visit review using our clinic review tool, if applicable. No additional management support is needed unless otherwise documented below in the visit note. 

## 2013-01-28 ENCOUNTER — Telehealth: Payer: Self-pay | Admitting: Family Medicine

## 2013-01-28 NOTE — Telephone Encounter (Signed)
Caller: Chase Anderson/Patient; Phone: [phone]; Reason for Call:Pt states he believes he is developing Shingles. States he has had it 3x before and he has developed a pain on his L side that feels exactly like the pain he has had before with shingles outbreaks. States there is nothing visible on the skin at this point, just the pain he feels is the only symptom. He states that he has a script for Valtrex prescribed by Dr. Lurlean Leyden for him to have on-hand for a Shingles outbreak and so he started taking that when the pain began yesterday 11/19. He wants to make Dr. Tawanna Cooler aware just incase Dr. Tawanna Cooler would want to see him or has any instructions for him. Of note: he also started Remicade yesterday for the first time and the possible shingles pain began within a few hours after first Remicade dose. He states he has a call into Dr. Ewell Poe office (who manages the Remicade) to make Dr. Dareen Piano aware of his symptoms as well. Assured him will send this message to Dr. Tawanna Cooler.

## 2013-02-18 ENCOUNTER — Encounter: Payer: Self-pay | Admitting: Cardiovascular Disease

## 2013-02-18 ENCOUNTER — Ambulatory Visit (INDEPENDENT_AMBULATORY_CARE_PROVIDER_SITE_OTHER): Payer: Medicare Other | Admitting: Cardiovascular Disease

## 2013-02-18 VITALS — BP 129/82 | HR 90 | Ht 71.0 in | Wt 182.0 lb

## 2013-02-18 DIAGNOSIS — I340 Nonrheumatic mitral (valve) insufficiency: Secondary | ICD-10-CM

## 2013-02-18 DIAGNOSIS — I2510 Atherosclerotic heart disease of native coronary artery without angina pectoris: Secondary | ICD-10-CM

## 2013-02-18 DIAGNOSIS — I10 Essential (primary) hypertension: Secondary | ICD-10-CM

## 2013-02-18 DIAGNOSIS — I341 Nonrheumatic mitral (valve) prolapse: Secondary | ICD-10-CM

## 2013-02-18 DIAGNOSIS — E785 Hyperlipidemia, unspecified: Secondary | ICD-10-CM

## 2013-02-18 DIAGNOSIS — I059 Rheumatic mitral valve disease, unspecified: Secondary | ICD-10-CM

## 2013-02-18 DIAGNOSIS — I252 Old myocardial infarction: Secondary | ICD-10-CM

## 2013-02-18 NOTE — Progress Notes (Signed)
Patient ID: Chase Anderson, male   DOB: Sep 17, 1945, 67 y.o.   MRN: [id number]      SUBJECTIVE: The patient is a 67 year old male with a history of coronary artery disease and inferior wall myocardial infarction (2004), hypertension, diabetes mellitus, mitral valve prolapse with moderate mitral regurgitation, and hyperlipidemia. He has been doing very well and denies chest pain, shortness of breath, orthopnea, leg swelling, dizziness, and paroxysmal nocturnal dyspnea. About 3 times in the past 2 months, he has felt an episodic palpitation with a weak spell which lasts seconds. It does not bother him and he has had these very seldomly since his heart attack. He walks 1-2 miles daily. He was started on lisinopril in the past but it caused symptomatic hypotension and was discontinued.  Allergies  Allergen Reactions  . Codeine     REACTION: itching and rash  . Lisinopril   . Penicillins     REACTION: as a child    Current Outpatient Prescriptions  Medication Sig Dispense Refill  . amLODipine (NORVASC) 10 MG tablet Take 1 tablet (10 mg total) by mouth daily.  100 tablet  3  . aspirin 325 MG tablet Take 325 mg by mouth daily.        . BD ULTRA-FINE LANCETS lancets Use once daily for glucose control  100 each  3  . clindamycin (CLEOCIN) 300 MG capsule Take 300 mg by mouth as needed.      . fluconazole (DIFLUCAN) 100 MG tablet Take 100 mg by mouth daily.      . fluocinonide ointment (LIDEX) 0.05 % Apply topically daily. Apply once daily as needed  60 g  2  . glucose blood (ACCU-CHEK AVIVA PLUS) test strip 1 each by Other route daily.  100 each  3  . InFLIXimab (REMICADE IV) Inject into the vein.      . metFORMIN (GLUCOPHAGE) 1000 MG tablet TAKE ONE TABLET BY MOUTH DAILY WITH BREAKFAST  100 tablet  3  . metFORMIN (GLUCOPHAGE) 500 MG tablet One tablet prior to dinner  100 tablet  3  . niacin 500 MG tablet Take 500 mg by mouth daily. Take one tab once daily       . nitroGLYCERIN (NITROSTAT) 0.3  MG SL tablet Place 1 tablet (0.3 mg total) under the tongue as needed.  25 tablet  1  . simvastatin (ZOCOR) 20 MG tablet Take 1 tablet (20 mg total) by mouth at bedtime.  100 tablet  3  . sulfamethoxazole-trimethoprim (BACTRIM DS) 800-160 MG per tablet Take 1 tablet by mouth 3 (three) times a week.       . tadalafil (CIALIS) 20 MG tablet Take 0.5-1 tablets (10-20 mg total) by mouth every other day as needed for erectile dysfunction.  10 tablet  11  . triamcinolone cream (KENALOG) 0.1 % Apply topically as needed.  45 g  3  . valACYclovir (VALTREX) 500 MG tablet Take 1 tablet (500 mg total) by mouth as needed.  60 tablet  3   No current facility-administered medications for this visit.    Past Medical History  Diagnosis Date  . Myocardial infarction   . CAD (coronary artery disease)   . Hyperlipidemia   . Hypertension   . Fatigue   . Anxiety   . Diabetes mellitus     type II  . Osteoarthritis   . Erectile dysfunction   . Leukopenia   . Dermatitis     other atopic  . Allergic rhinitis  Past Surgical History  Procedure Laterality Date  . Esophagogastroduodenoscopy  04/23/2005  . Tonsillectomy    . Knee arthroscopy      right    History   Social History  . Marital Status: Married    Spouse Name: N/A    Number of Children: N/A  . Years of Education: N/A   Occupational History  . Not on file.   Social History Main Topics  . Smoking status: Former Smoker -- 2 years    Quit date: 12/09/2001  . Smokeless tobacco: Not on file  . Alcohol Use: No  . Drug Use: No  . Sexual Activity: Not on file   Other Topics Concern  . Not on file   Social History Narrative  . No narrative on file     Filed Vitals:   02/18/13 1048  BP: 129/82  Pulse: 90  Height: 5\' 11"  (1.803 m)  Weight: 182 lb (82.555 kg)    PHYSICAL EXAM General: NAD Neck: No JVD, no thyromegaly or thyroid nodule.  Lungs: Clear to auscultation bilaterally with normal respiratory effort. CV:  Nondisplaced PMI.  Heart regular S1/S2, no S3/S4, III/VI apical holosystolic murmur.  No peripheral edema.  No carotid bruit.  Normal pedal pulses.  Abdomen: Soft, nontender, no hepatosplenomegaly, no distention.  Neurologic: Alert and oriented x 3.  Psych: Normal affect. Extremities: No clubbing or cyanosis.   ECG: reviewed and available in electronic records.  Echo (12-26-10): - Left ventricle: The cavity size was normal. Wall thickness was increased in a pattern of mild LVH. Systolic function was normal. The estimated ejection fraction was in the range of 55% to 60%. Wall motion was normal; there were no regional wall motion abnormalities. Doppler parameters are consistent with abnormal left ventricular relaxation (grade 1 diastolic dysfunction). - Aortic valve: There was no stenosis. Trivial regurgitation. - Mitral valve: Bileaflet mitral valve prolapse with mid to late systolic moderate mitral regurgitation. - Left atrium: The atrium was mildly dilated. - Right ventricle: The cavity size was normal. Systolic function was normal. - Tricuspid valve: Peak RV-RA gradient: 22mm Hg (S). - Pulmonary arteries: PA peak pressure: 27mm Hg (S). - Inferior vena cava: The vessel was normal in size; the respirophasic diameter changes were in the normal range (= 50%); findings are consistent with normal central venous pressure. Impressions:  - Normal LV size and systolic function, EF 55-60%. Mild LV hypertrophy. There was bileaflet mitral valve prolapse with mid to late systolic moderate mitral regurgitation. Normal RV size and systolic function.  ECG: nsr, 95 bpm, right superior axis   ASSESSMENT AND PLAN: 1. CAD with h/o inferior wall MI: symptomatically stable. I advised him to reduce his ASA to 81 mg daily. We had a lengthy conversation about being on a beta blocker, given his resting HR of 95 bpm and h/o MI. For the time being, as he has been doing well, he would prefer to hold off  on this. I've asked him to monitor his HR 3 times a week for the next 4 weeks and to inform me of these values. At that time, we will make a final decision about a beta blocker. My recommendation would be metoprolol 12.5 mg bid, with monitoring of his BP. He is on a statin. 2. MVP with moderate MR: no symptoms of heart failure, but resting HR 95 bpm. Will assess with an echocardiogram for interval progression. 3. HTN: controlled on amlodipine 10 mg daily. Would consider dose reduction if need be, if/when he were  to start metoprolol. 4. Hyperlipidemia: on simvastatin 20 mg daily, along with niacin. Lipids on 01/25/13: TC 99, TG 56, HDL 37, LDL 50.  Dispo: f/u 6 months.  Prentice Docker, M.D., F.A.C.C.

## 2013-02-18 NOTE — Patient Instructions (Addendum)
Your physician wants you to follow-up in: 6 months You will receive a reminder letter in the mail two months in advance. If you don't receive a letter, please call our office to schedule the follow-up appointment.   Your physician has recommended you make the following change in your medication:   Reduce your aspirin to 81 mg daily    Your physician has requested that you have an echocardiogram. Echocardiography is a painless test that uses sound waves to create images of your heart. It provides your doctor with information about the size and shape of your heart and how well your heart's chambers and valves are working. This procedure takes approximately one hour. There are no restrictions for this procedure.  Please call back in one month to give [place] your daily heart rate readings

## 2013-03-01 ENCOUNTER — Ambulatory Visit (HOSPITAL_COMMUNITY)
Admission: RE | Admit: 2013-03-01 | Discharge: 2013-03-01 | Disposition: A | Discharge status: Home / Self Care | Payer: Medicare Other | Source: Ambulatory Visit | Attending: Cardiovascular Disease | Admitting: Cardiovascular Disease

## 2013-03-01 DIAGNOSIS — I341 Nonrheumatic mitral (valve) prolapse: Secondary | ICD-10-CM

## 2013-03-01 DIAGNOSIS — I2510 Atherosclerotic heart disease of native coronary artery without angina pectoris: Secondary | ICD-10-CM | POA: Insufficient documentation

## 2013-03-01 DIAGNOSIS — I059 Rheumatic mitral valve disease, unspecified: Secondary | ICD-10-CM | POA: Insufficient documentation

## 2013-03-01 DIAGNOSIS — E785 Hyperlipidemia, unspecified: Secondary | ICD-10-CM | POA: Insufficient documentation

## 2013-03-01 DIAGNOSIS — Z87891 Personal history of nicotine dependence: Secondary | ICD-10-CM | POA: Insufficient documentation

## 2013-03-01 DIAGNOSIS — I10 Essential (primary) hypertension: Secondary | ICD-10-CM

## 2013-03-01 DIAGNOSIS — E119 Type 2 diabetes mellitus without complications: Secondary | ICD-10-CM | POA: Insufficient documentation

## 2013-03-01 NOTE — Progress Notes (Signed)
*  PRELIMINARY RESULTS* Echocardiogram 2D Echocardiogram has been performed.  Chase Anderson 03/01/2013, 12:19 PM

## 2013-03-10 ENCOUNTER — Ambulatory Visit (INDEPENDENT_AMBULATORY_CARE_PROVIDER_SITE_OTHER): Payer: Medicare Other | Admitting: *Deleted

## 2013-03-10 DIAGNOSIS — Z23 Encounter for immunization: Secondary | ICD-10-CM

## 2013-03-22 ENCOUNTER — Other Ambulatory Visit: Payer: Self-pay | Admitting: Family Medicine

## 2013-04-12 ENCOUNTER — Other Ambulatory Visit: Payer: Self-pay | Admitting: Family Medicine

## 2013-04-28 ENCOUNTER — Other Ambulatory Visit: Payer: Self-pay | Admitting: Family Medicine

## 2013-09-09 ENCOUNTER — Ambulatory Visit (INDEPENDENT_AMBULATORY_CARE_PROVIDER_SITE_OTHER): Payer: Medicare Other | Admitting: Cardiovascular Disease

## 2013-09-09 ENCOUNTER — Encounter: Payer: Self-pay | Admitting: Cardiovascular Disease

## 2013-09-09 VITALS — BP 109/71 | HR 69 | Ht 71.0 in | Wt 177.0 lb

## 2013-09-09 DIAGNOSIS — E785 Hyperlipidemia, unspecified: Secondary | ICD-10-CM

## 2013-09-09 DIAGNOSIS — I2510 Atherosclerotic heart disease of native coronary artery without angina pectoris: Secondary | ICD-10-CM

## 2013-09-09 DIAGNOSIS — I059 Rheumatic mitral valve disease, unspecified: Secondary | ICD-10-CM

## 2013-09-09 DIAGNOSIS — I252 Old myocardial infarction: Secondary | ICD-10-CM

## 2013-09-09 DIAGNOSIS — I10 Essential (primary) hypertension: Secondary | ICD-10-CM

## 2013-09-09 DIAGNOSIS — I341 Nonrheumatic mitral (valve) prolapse: Secondary | ICD-10-CM

## 2013-09-09 DIAGNOSIS — I340 Nonrheumatic mitral (valve) insufficiency: Secondary | ICD-10-CM

## 2013-09-09 NOTE — Progress Notes (Signed)
Patient ID: Chase Anderson, male   DOB: [birth year], 68 y.o.   MRN: [id number]      SUBJECTIVE: The patient is a 68 year old male with a history of coronary artery disease and inferior wall myocardial infarction (2004), hypertension, diabetes mellitus, mitral valve prolapse with moderate mitral regurgitation, and hyperlipidemia.  He has been doing very well and denies chest pain, shortness of breath, orthopnea, leg swelling, dizziness, and paroxysmal nocturnal dyspnea.  He is only occasionally bothered by headaches around the time of his Remicade infusions. He walks 1-2 miles daily.  He was started on lisinopril in the past but it caused symptomatic hypotension and was discontinued. He has been monitoring his heart rate and it stays in the 70-80 beats per minute range.    Allergies  Allergen Reactions  . Codeine     REACTION: itching and rash  . Lisinopril   . Penicillins     REACTION: as a child    Current Outpatient Prescriptions  Medication Sig Dispense Refill  . ACCU-CHEK AVIVA PLUS test strip USE TO TEST BLOOD SUGAR AS DIRECTED  100 each  3  . amLODipine (NORVASC) 10 MG tablet Take 1 tablet (10 mg total) by mouth daily.  100 tablet  3  . aspirin EC 81 MG tablet Take 81 mg by mouth daily.      . BD ULTRA-FINE LANCETS lancets Use once daily for glucose control  100 each  3  . fluconazole (DIFLUCAN) 100 MG tablet Take 100 mg by mouth daily as needed.       . fluocinonide ointment (LIDEX) 0.05 % Apply topically daily. Apply once daily as needed  60 g  2  . glucose blood (ACCU-CHEK AVIVA PLUS) test strip 1 each by Other route daily.  100 each  3  . InFLIXimab (REMICADE IV) Inject into the vein every 8 (eight) weeks.       . metFORMIN (GLUCOPHAGE) 1000 MG tablet TAKE ONE TABLET BY MOUTH DAILY WITH BREAKFAST  100 tablet  3  . metFORMIN (GLUCOPHAGE) 500 MG tablet TAKE 1 TABLET BY MOUTH PRIOR TO DINNER  100 tablet  3  . niacin 500 MG tablet Take 500 mg by mouth daily. Take one tab once  daily       . nitroGLYCERIN (NITROSTAT) 0.3 MG SL tablet Place 1 tablet (0.3 mg total) under the tongue as needed.  25 tablet  1  . simvastatin (ZOCOR) 20 MG tablet Take 1 tablet (20 mg total) by mouth at bedtime.  100 tablet  3  . sulfamethoxazole-trimethoprim (BACTRIM DS) 800-160 MG per tablet Take 1 tablet by mouth 3 (three) times a week.       . tadalafil (CIALIS) 20 MG tablet Take 0.5-1 tablets (10-20 mg total) by mouth every other day as needed for erectile dysfunction.  10 tablet  11  . traMADol (ULTRAM) 50 MG tablet Take 1 tablet by mouth as needed.      . triamcinolone cream (KENALOG) 0.1 % Apply topically as needed.  45 g  3  . valACYclovir (VALTREX) 500 MG tablet Take 1 tablet (500 mg total) by mouth as needed.  60 tablet  3   No current facility-administered medications for this visit.    Past Medical History  Diagnosis Date  . Myocardial infarction   . CAD (coronary artery disease)   . Hyperlipidemia   . Hypertension   . Fatigue   . Anxiety   . Diabetes mellitus     type II  .  Osteoarthritis   . Erectile dysfunction   . Leukopenia   . Dermatitis     other atopic  . Allergic rhinitis     Past Surgical History  Procedure Laterality Date  . Esophagogastroduodenoscopy  04/23/2005  . Tonsillectomy    . Knee arthroscopy      right    History   Social History  . Marital Status: Married    Spouse Name: N/A    Number of Children: N/A  . Years of Education: N/A   Occupational History  . Not on file.   Social History Main Topics  . Smoking status: Former Smoker -- 2 years    Types: Cigarettes    Quit date: 12/09/2001  . Smokeless tobacco: Former Systems developer    Types: Chew    Quit date: 03/11/2002  . Alcohol Use: No  . Drug Use: No  . Sexual Activity: Not on file   Other Topics Concern  . Not on file   Social History Narrative  . No narrative on file     Filed Vitals:   09/09/13 1051  BP: 109/71  Pulse: 69  Height: 5\' 11"  (1.803 m)  Weight: 177 lb  (80.287 kg)  SpO2: 98%    PHYSICAL EXAM General: NAD  Neck: No JVD, no thyromegaly or thyroid nodule.  Lungs: Clear to auscultation bilaterally with normal respiratory effort.  CV: Nondisplaced PMI. Heart regular S1/S2, no S3/S4, I/VI apical holosystolic murmur. No peripheral edema. No carotid bruit. Normal pedal pulses.  Abdomen: Soft, nontender, no hepatosplenomegaly, no distention.  Neurologic: Alert and oriented x 3.  Psych: Normal affect.  Extremities: No clubbing or cyanosis.   ECG: reviewed and available in electronic records.    Echo (03/01/13):  - Left ventricle: Mild posterior wall and moderate basal septal hypertrophy. The cavity size was normal. Systolic function was normal. The estimated ejection fraction was in the range of 55% to 60%. Wall motion was normal; there were no regional wall motion abnormalities. There was an increased relative contribution of atrial contraction to ventricular filling. Doppler parameters are consistent with abnormal left ventricular relaxation (grade 1 diastolic dysfunction). - Aortic valve: Mildly calcified annulus. Mild regurgitation. - Mitral valve: There appears to be mid to late systolic mild to moderate mitral regurgitation. Bileaflet mitral valve prolapse. Moderately thickened leaflets . There was no evidence for stenosis. - Left atrium: The atrium was mildly dilated. - Systemic veins: IVC was dilated with normal respirophasic variation, suggestive of a CVP of 8 mmHg.      ASSESSMENT AND PLAN: 1. CAD with h/o inferior wall MI: Symptomatically stable. Continue ASA 81 mg daily. He is on a statin. Prefers to avoid a beta blocker. 2. MVP with mild to moderate MR: No symptoms of heart failure. Continued surveillance monitoring. 3. HTN: Controlled on amlodipine 10 mg daily.  4. Hyperlipidemia: On simvastatin 20 mg daily, along with niacin. Lipids on 01/25/13: TC 99, TG 56, HDL 37, LDL 50.   Dispo: f/u 12 months.  Kate Sable, M.D., F.A.C.C.

## 2013-09-09 NOTE — Patient Instructions (Signed)
Continue all current medications. Your physician wants you to follow up in:  1 year.  You will receive a reminder letter in the mail one-two months in advance.  If you don't receive a letter, please call our office to schedule the follow up appointment   

## 2014-02-01 ENCOUNTER — Ambulatory Visit (INDEPENDENT_AMBULATORY_CARE_PROVIDER_SITE_OTHER): Payer: Medicare Other | Admitting: Family Medicine

## 2014-02-01 ENCOUNTER — Encounter: Payer: Self-pay | Admitting: Family Medicine

## 2014-02-01 VITALS — BP 120/80 | Temp 98.4°F | Ht 71.5 in | Wt 176.0 lb

## 2014-02-01 DIAGNOSIS — I25700 Atherosclerosis of coronary artery bypass graft(s), unspecified, with unstable angina pectoris: Secondary | ICD-10-CM

## 2014-02-01 DIAGNOSIS — F528 Other sexual dysfunction not due to a substance or known physiological condition: Secondary | ICD-10-CM

## 2014-02-01 DIAGNOSIS — I10 Essential (primary) hypertension: Secondary | ICD-10-CM

## 2014-02-01 DIAGNOSIS — J309 Allergic rhinitis, unspecified: Secondary | ICD-10-CM

## 2014-02-01 DIAGNOSIS — E785 Hyperlipidemia, unspecified: Secondary | ICD-10-CM

## 2014-02-01 DIAGNOSIS — D72819 Decreased white blood cell count, unspecified: Secondary | ICD-10-CM

## 2014-02-01 DIAGNOSIS — Z87438 Personal history of other diseases of male genital organs: Secondary | ICD-10-CM

## 2014-02-01 DIAGNOSIS — M1990 Unspecified osteoarthritis, unspecified site: Secondary | ICD-10-CM

## 2014-02-01 DIAGNOSIS — Z23 Encounter for immunization: Secondary | ICD-10-CM

## 2014-02-01 DIAGNOSIS — E139 Other specified diabetes mellitus without complications: Secondary | ICD-10-CM

## 2014-02-01 DIAGNOSIS — E119 Type 2 diabetes mellitus without complications: Secondary | ICD-10-CM

## 2014-02-01 DIAGNOSIS — E7800 Pure hypercholesterolemia, unspecified: Secondary | ICD-10-CM

## 2014-02-01 DIAGNOSIS — H01139 Eczematous dermatitis of unspecified eye, unspecified eyelid: Secondary | ICD-10-CM

## 2014-02-01 LAB — POCT URINALYSIS DIPSTICK
BILIRUBIN UA: NEGATIVE
GLUCOSE UA: NEGATIVE
KETONES UA: NEGATIVE
Leukocytes, UA: NEGATIVE
Nitrite, UA: NEGATIVE
SPEC GRAV UA: 1.015
Urobilinogen, UA: 0.2
pH, UA: 5.5

## 2014-02-01 LAB — HEPATIC FUNCTION PANEL
ALBUMIN: 5 g/dL (ref 3.5–5.2)
ALT: 23 U/L (ref 0–53)
AST: 22 U/L (ref 0–37)
Alkaline Phosphatase: 47 U/L (ref 39–117)
Bilirubin, Direct: 0.2 mg/dL (ref 0.0–0.3)
Total Bilirubin: 0.7 mg/dL (ref 0.2–1.2)
Total Protein: 7.1 g/dL (ref 6.0–8.3)

## 2014-02-01 LAB — CBC WITH DIFFERENTIAL/PLATELET
BASOS PCT: 0.4 % (ref 0.0–3.0)
Basophils Absolute: 0 10*3/uL (ref 0.0–0.1)
EOS PCT: 2.3 % (ref 0.0–5.0)
Eosinophils Absolute: 0.1 10*3/uL (ref 0.0–0.7)
HCT: 47.3 % (ref 39.0–52.0)
HEMOGLOBIN: 15.9 g/dL (ref 13.0–17.0)
LYMPHS ABS: 0.6 10*3/uL — AB (ref 0.7–4.0)
LYMPHS PCT: 10.5 % — AB (ref 12.0–46.0)
MCHC: 33.7 g/dL (ref 30.0–36.0)
MCV: 92.8 fl (ref 78.0–100.0)
MONOS PCT: 6.9 % (ref 3.0–12.0)
Monocytes Absolute: 0.4 10*3/uL (ref 0.1–1.0)
NEUTROS ABS: 4.9 10*3/uL (ref 1.4–7.7)
Neutrophils Relative %: 79.9 % — ABNORMAL HIGH (ref 43.0–77.0)
Platelets: 675 10*3/uL — ABNORMAL HIGH (ref 150.0–400.0)
RBC: 5.1 Mil/uL (ref 4.22–5.81)
RDW: 13.3 % (ref 11.5–15.5)
WBC: 6.2 10*3/uL (ref 4.0–10.5)

## 2014-02-01 LAB — HEMOGLOBIN A1C: HEMOGLOBIN A1C: 6.5 % (ref 4.6–6.5)

## 2014-02-01 LAB — LIPID PANEL
CHOL/HDL RATIO: 2
Cholesterol: 98 mg/dL (ref 0–200)
HDL: 39.8 mg/dL (ref >39.00)
LDL CALC: 43 mg/dL (ref 0–99)
NonHDL: 58.2
Triglycerides: 74 mg/dL (ref 0.0–149.0)
VLDL: 14.8 mg/dL (ref 0.0–40.0)

## 2014-02-01 LAB — PSA: PSA: 0.56 ng/mL (ref 0.10–4.00)

## 2014-02-01 LAB — BASIC METABOLIC PANEL
BUN: 22 mg/dL (ref 6–23)
CO2: 25 meq/L (ref 19–32)
CREATININE: 1.5 mg/dL (ref 0.4–1.5)
Calcium: 10 mg/dL (ref 8.4–10.5)
Chloride: 105 mEq/L (ref 96–112)
GFR: 50.1 mL/min — AB (ref >60.00)
Glucose, Bld: 136 mg/dL — ABNORMAL HIGH (ref 70–99)
Potassium: 5.9 mEq/L — ABNORMAL HIGH (ref 3.5–5.1)
SODIUM: 142 meq/L (ref 135–145)

## 2014-02-01 LAB — MICROALBUMIN / CREATININE URINE RATIO
Creatinine,U: 180.8 mg/dL
MICROALB UR: 7.5 mg/dL — AB (ref 0.0–1.9)
MICROALB/CREAT RATIO: 4.1 mg/g (ref 0.0–30.0)

## 2014-02-01 LAB — TSH: TSH: 0.95 u[IU]/mL (ref 0.35–4.50)

## 2014-02-01 MED ORDER — TADALAFIL 20 MG PO TABS: 10.0000 mg | ORAL_TABLET | ORAL | Status: DC | PRN

## 2014-02-01 MED ORDER — NITROGLYCERIN 0.3 MG SL SUBL: 0.3000 mg | SUBLINGUAL_TABLET | SUBLINGUAL | Status: DC | PRN

## 2014-02-01 MED ORDER — METFORMIN HCL 1000 MG PO TABS: ORAL_TABLET | ORAL | Status: DC

## 2014-02-01 MED ORDER — METFORMIN HCL 500 MG PO TABS: ORAL_TABLET | ORAL | Status: DC

## 2014-02-01 MED ORDER — AMLODIPINE BESYLATE 10 MG PO TABS: 10.0000 mg | ORAL_TABLET | Freq: Every day | ORAL | Status: DC

## 2014-02-01 MED ORDER — SIMVASTATIN 20 MG PO TABS: 20.0000 mg | ORAL_TABLET | Freq: Every day | ORAL | Status: DC

## 2014-02-01 MED ORDER — TRIAMCINOLONE ACETONIDE 0.1 % EX CREA: TOPICAL_CREAM | CUTANEOUS | Status: DC | PRN

## 2014-02-01 MED ORDER — FLUOCINONIDE 0.05 % EX OINT: TOPICAL_OINTMENT | Freq: Every day | CUTANEOUS | Status: DC

## 2014-02-01 NOTE — Progress Notes (Signed)
Pre visit review using our clinic review tool, if applicable. No additional management support is needed unless otherwise documented below in the visit note. Lab Results  Component Value Date   HGBA1C 6.5 01/25/2013   HGBA1C 6.2 01/23/2012   HGBA1C 6.3 12/18/2010   Lab Results  Component Value Date   MICROALBUR 7.6* 01/25/2013   LDLCALC 50 01/25/2013   CREATININE 1.1 01/25/2013

## 2014-02-01 NOTE — Progress Notes (Signed)
   Subjective:    Patient ID: Chase Anderson, male    DOB: 1946-02-27, 68 y.o.   MRN: [id number]  HPI Chase Anderson is a 68 year old married male nonsmoker who comes in today for evaluation of hypertension hyperlipidemia diabetes2 and erectile dysfunction  He's also seen in cardiology yearly because he's had a history of coronary artery disease and mitral valve prolapse  His med list reviewed the been no changes  He also goes [facility] because of a history of leukopenia unknown etiology. He takes Diflucan 100 mg Bactrim 13 times weekly when necessary at the direction of the folks at [facility]. He also takes Valtrex when necessary  He gets routine eye care, dental care, colonoscopy and GI, vaccinations updated by [place] list was correct.  Cognitive function normal he walks daily home health safety reviewed no issues identified, no guns in the house, he does have a healthcare power of attorney and living well  He's also followed in rheumatology because of his psoriasis and is on Remicade    Review of Systems  Constitutional: Negative.   HENT: Negative.   Eyes: Negative.   Respiratory: Negative.   Cardiovascular: Negative.   Gastrointestinal: Negative.   Endocrine: Negative.   Genitourinary: Negative.   Musculoskeletal: Negative.   Skin: Negative.   Allergic/Immunologic: Negative.   Neurological: Negative.   Hematological: Negative.   Psychiatric/Behavioral: Negative.        Objective:   Physical Exam  Constitutional: He is oriented to person, place, and time. He appears well-developed and well-nourished.  HENT:  Head: Normocephalic and atraumatic.  Right Ear: External ear normal.  Left Ear: External ear normal.  Nose: Nose normal.  Mouth/Throat: Oropharynx is clear and moist.  Eyes: Conjunctivae and EOM are normal. Pupils are equal, round, and reactive to light.  Neck: Normal range of motion. Neck supple. No JVD present. No tracheal deviation present. No  thyromegaly present.  Cardiovascular: Normal rate, regular rhythm, normal heart sounds and intact distal pulses.  Exam reveals no gallop and no friction rub.   No murmur heard. Pulmonary/Chest: Effort normal and breath sounds normal. No stridor. No respiratory distress. He has no wheezes. He has no rales. He exhibits no tenderness.  Abdominal: Soft. Bowel sounds are normal. He exhibits no distension and no mass. There is no tenderness. There is no rebound and no guarding.  Genitourinary: Rectum normal and penis normal. Guaiac negative stool. No penile tenderness.  2+ symmetrical nonnodular BPH........ nocturia 2 otherwise asymptomatic  Musculoskeletal: Normal range of motion. He exhibits no edema or tenderness.  Lymphadenopathy:    He has no cervical adenopathy.  Neurological: He is alert and oriented to person, place, and time. He has normal reflexes. No cranial nerve deficit. He exhibits normal muscle tone.  Skin: Skin is warm and dry. No rash noted. No erythema. No pallor.  Total body skin exam normal except for hypopigmented areas secondary to his long-term psoriasis  Psychiatric: He has a normal mood and affect. His behavior is normal. Judgment and thought content normal.  Nursing note and vitals reviewed.         Assessment & Plan:  Hypertension at goal....... continue current therapy  Diabetes type 2....... check labs  Hyperlipidemia continue Zocor and aspirin....... check labs  Psoriasis........... follow-up in rheumatology  Leukopenia etiology unknown............ followed by Chase Anderson  Coronary artery disease annual cardiac evaluation............ cardiac wise stable  BPH........ asymptomatic

## 2014-02-01 NOTE — Patient Instructions (Signed)
Continue current medications  Labs today  We will call your lab work in a couple days  Return in one year sooner if any problems  Healthcare power of attorney and living well

## 2014-02-07 ENCOUNTER — Other Ambulatory Visit: Payer: Self-pay | Admitting: *Deleted

## 2014-02-07 DIAGNOSIS — I25700 Atherosclerosis of coronary artery bypass graft(s), unspecified, with unstable angina pectoris: Secondary | ICD-10-CM

## 2014-02-07 MED ORDER — NITROGLYCERIN 0.3 MG SL SUBL: 0.3000 mg | SUBLINGUAL_TABLET | SUBLINGUAL | Status: DC | PRN

## 2014-04-25 ENCOUNTER — Other Ambulatory Visit: Payer: Self-pay

## 2014-04-25 MED ORDER — GLUCOSE BLOOD VI STRP: ORAL_STRIP | Status: DC

## 2014-06-10 ENCOUNTER — Other Ambulatory Visit: Payer: Self-pay | Admitting: Family Medicine

## 2014-09-19 ENCOUNTER — Encounter: Payer: Self-pay | Admitting: Cardiovascular Disease

## 2014-09-19 ENCOUNTER — Ambulatory Visit (INDEPENDENT_AMBULATORY_CARE_PROVIDER_SITE_OTHER): Payer: Medicare Other | Admitting: Cardiovascular Disease

## 2014-09-19 VITALS — BP 126/76 | HR 87 | Ht 71.0 in | Wt 172.0 lb

## 2014-09-19 DIAGNOSIS — I10 Essential (primary) hypertension: Secondary | ICD-10-CM | POA: Diagnosis not present

## 2014-09-19 DIAGNOSIS — I340 Nonrheumatic mitral (valve) insufficiency: Secondary | ICD-10-CM

## 2014-09-19 DIAGNOSIS — I341 Nonrheumatic mitral (valve) prolapse: Secondary | ICD-10-CM | POA: Diagnosis not present

## 2014-09-19 DIAGNOSIS — I2510 Atherosclerotic heart disease of native coronary artery without angina pectoris: Secondary | ICD-10-CM

## 2014-09-19 DIAGNOSIS — I252 Old myocardial infarction: Secondary | ICD-10-CM | POA: Diagnosis not present

## 2014-09-19 NOTE — Patient Instructions (Signed)
Your physician wants you to follow-up in: 1 year with Dr Koneswaran You will receive a reminder letter in the mail two months in advance. If you don't receive a letter, please call our office to schedule the follow-up appointment.    Your physician recommends that you continue on your current medications as directed. Please refer to the Current Medication list given to you today.     Thank you for choosing [facility] !        

## 2014-09-19 NOTE — Progress Notes (Signed)
Patient ID: Chase Anderson, male   DOB: 08-Aug-1945, 69 y.o.   MRN: [id number]      SUBJECTIVE: The patient returns for routine follow up. He has a history of coronary artery disease and inferior wall myocardial infarction (2004), hypertension, diabetes mellitus, mitral valve prolapse with moderate mitral regurgitation, and hyperlipidemia.   He has been doing very well and denies chest pain, orthopnea, leg swelling, dizziness, and paroxysmal nocturnal dyspnea.   He continues to walk one to two miles daily without difficulty. [place] difficulty. He gets short of breath if he has to lift anything heavy and walk or if he has to climb a long flight of stairs. This has not gotten worse and symptoms have remained stable for several years.  ECG performed in the office today demonstrates normal sinus rhythm, heart rate 87 bpm, first-degree AV block, PR interval 220 ms, with inferior T-wave inversions in III and aVF.   Review of Systems: As per "subjective", otherwise negative.  Allergies  Allergen Reactions  . Codeine     REACTION: itching and rash  . Lisinopril   . Penicillins     REACTION: as a child    Current Outpatient Prescriptions  Medication Sig Dispense Refill  . amLODipine (NORVASC) 10 MG tablet Take 1 tablet (10 mg total) by mouth daily. 100 tablet 3  . aspirin EC 81 MG tablet Take 81 mg by mouth daily.    . BD ULTRA-FINE LANCETS lancets Use once daily for glucose control 100 each 3  . fluconazole (DIFLUCAN) 100 MG tablet Take 100 mg by mouth daily as needed.     . fluocinonide ointment (LIDEX) 0.05 % Apply topically daily. Apply once daily as needed 60 g 2  . glucose blood (ACCU-CHEK AVIVA PLUS) test strip 1 each by Other route daily. 100 each 3  . glucose blood (ACCU-CHEK AVIVA PLUS) test strip USE TO TEST BLOOD SUGAR AS DIRECTED 100 each 3  . InFLIXimab (REMICADE IV) Inject into the vein every 8 (eight) weeks.     .  metFORMIN (GLUCOPHAGE) 1000 MG tablet TAKE ONE TABLET BY MOUTH DAILY WITH BREAKFAST 100 tablet 3  . metFORMIN (GLUCOPHAGE) 500 MG tablet TAKE 1 TABLET BY MOUTH PRIOR TO DINNER 100 tablet 3  . niacin 500 MG tablet Take 500 mg by mouth daily. Take one tab once daily     . nitroGLYCERIN (NITROSTAT) 0.3 MG SL tablet Place 1 tablet (0.3 mg total) under the tongue as needed. 100 tablet 1  . simvastatin (ZOCOR) 20 MG tablet Take 1 tablet (20 mg total) by mouth at bedtime. 100 tablet 3  . sulfamethoxazole-trimethoprim (BACTRIM DS) 800-160 MG per tablet Take 1 tablet by mouth 3 (three) times a week.     . tadalafil (CIALIS) 20 MG tablet Take 0.5-1 tablets (10-20 mg total) by mouth every other day as needed for erectile dysfunction. 10 tablet 11  . traMADol (ULTRAM) 50 MG tablet Take 1 tablet by mouth as needed.    . triamcinolone cream (KENALOG) 0.1 % Apply topically as needed. 45 g 3  . valACYclovir (VALTREX) 500 MG tablet Take 1 tablet (500 mg total) by mouth as needed. 60 tablet 3   No current facility-administered medications for this visit.    Past Medical History  Diagnosis Date  . Myocardial infarction   . CAD (coronary artery disease)   . Hyperlipidemia   . Hypertension   . Fatigue   .  Anxiety   . Diabetes mellitus     type II  . Osteoarthritis   . Erectile dysfunction   . Leukopenia   . Dermatitis     other atopic  . Allergic rhinitis     Past Surgical History  Procedure Laterality Date  . Esophagogastroduodenoscopy  04/23/2005  . Tonsillectomy    . Knee arthroscopy      right    History   Social History  . Marital Status: Married    Spouse Name: N/A  . Number of Children: N/A  . Years of Education: N/A   Occupational History  . Not on file.   Social History Main Topics  . Smoking status: Former Smoker -- 2 years    Types: Cigarettes    Quit date: 12/09/2001  . Smokeless tobacco: Former Systems developer    Types: Chew    Quit date: 03/11/2002  . Alcohol Use: No  . Drug  Use: No  . Sexual Activity: Not on file   Other Topics Concern  . Not on file   Social History Narrative     Filed Vitals:   09/19/14 1302  BP: 126/76  Pulse: 105  Height: 5\' 11"  (1.803 m)  Weight: 172 lb (78.019 kg)  SpO2: 97%    PHYSICAL EXAM General: NAD  Neck: No JVD, no thyromegaly or thyroid nodule.  Lungs: Clear to auscultation bilaterally with normal respiratory effort.  CV: Nondisplaced PMI. Heart regular S1/S2, no S3/S4, III/VI apical holosystolic murmur. No peripheral edema. No carotid bruit.   Abdomen: Soft, nontender, no distention.  Neurologic: Alert and oriented x 3.  Psych: Normal affect.  Extremities: No clubbing or cyanosis.    ECG: Most recent ECG reviewed.      ASSESSMENT AND PLAN: 1. CAD with h/o inferior wall MI: Symptomatically stable. Continue ASA 81 mg daily. He is on a statin. Prefers to avoid a beta blocker.  2. MVP with mild to moderate MR: No change in symptoms. No symptoms of heart failure. Continued surveillance monitoring.  3. Essential HTN: Controlled on amlodipine 10 mg daily. No changes.  4. Hyperlipidemia: On simvastatin 20 mg daily, along with niacin. Lipids on 02/01/14: TC 98, TG 74, HDL 39, LDL 43.   Dispo: f/u 1 year.  Kate Sable, M.D., F.A.C.C.

## 2014-11-07 ENCOUNTER — Telehealth: Payer: Self-pay | Admitting: Family Medicine

## 2014-11-18 ENCOUNTER — Encounter: Payer: Self-pay | Admitting: Family Medicine

## 2015-01-17 ENCOUNTER — Encounter: Payer: Self-pay | Admitting: Internal Medicine

## 2015-02-06 ENCOUNTER — Ambulatory Visit (INDEPENDENT_AMBULATORY_CARE_PROVIDER_SITE_OTHER): Payer: Medicare Other | Admitting: Family Medicine

## 2015-02-06 ENCOUNTER — Encounter: Payer: Self-pay | Admitting: Family Medicine

## 2015-02-06 VITALS — BP 120/80 | Temp 98.7°F | Ht 71.25 in | Wt 176.0 lb

## 2015-02-06 DIAGNOSIS — I252 Old myocardial infarction: Secondary | ICD-10-CM

## 2015-02-06 DIAGNOSIS — E785 Hyperlipidemia, unspecified: Secondary | ICD-10-CM

## 2015-02-06 DIAGNOSIS — I10 Essential (primary) hypertension: Secondary | ICD-10-CM

## 2015-02-06 DIAGNOSIS — E7800 Pure hypercholesterolemia, unspecified: Secondary | ICD-10-CM

## 2015-02-06 DIAGNOSIS — I25700 Atherosclerosis of coronary artery bypass graft(s), unspecified, with unstable angina pectoris: Secondary | ICD-10-CM

## 2015-02-06 DIAGNOSIS — F528 Other sexual dysfunction not due to a substance or known physiological condition: Secondary | ICD-10-CM

## 2015-02-06 DIAGNOSIS — L4050 Arthropathic psoriasis, unspecified: Secondary | ICD-10-CM

## 2015-02-06 DIAGNOSIS — Z87438 Personal history of other diseases of male genital organs: Secondary | ICD-10-CM

## 2015-02-06 DIAGNOSIS — Z23 Encounter for immunization: Secondary | ICD-10-CM

## 2015-02-06 DIAGNOSIS — D72819 Decreased white blood cell count, unspecified: Secondary | ICD-10-CM | POA: Diagnosis not present

## 2015-02-06 DIAGNOSIS — Z125 Encounter for screening for malignant neoplasm of prostate: Secondary | ICD-10-CM | POA: Diagnosis not present

## 2015-02-06 DIAGNOSIS — E119 Type 2 diabetes mellitus without complications: Secondary | ICD-10-CM

## 2015-02-06 DIAGNOSIS — E139 Other specified diabetes mellitus without complications: Secondary | ICD-10-CM

## 2015-02-06 LAB — CBC WITH DIFFERENTIAL/PLATELET
BASOS PCT: 0.3 % (ref 0.0–3.0)
Basophils Absolute: 0 10*3/uL (ref 0.0–0.1)
Eosinophils Absolute: 0.2 10*3/uL (ref 0.0–0.7)
Eosinophils Relative: 2.2 % (ref 0.0–5.0)
HEMATOCRIT: 48.6 % (ref 39.0–52.0)
Hemoglobin: 16.4 g/dL (ref 13.0–17.0)
LYMPHS ABS: 0.8 10*3/uL (ref 0.7–4.0)
LYMPHS PCT: 11 % — AB (ref 12.0–46.0)
MCHC: 33.7 g/dL (ref 30.0–36.0)
MCV: 93 fl (ref 78.0–100.0)
MONOS PCT: 6.9 % (ref 3.0–12.0)
Monocytes Absolute: 0.5 10*3/uL (ref 0.1–1.0)
NEUTROS ABS: 5.8 10*3/uL (ref 1.4–7.7)
NEUTROS PCT: 79.6 % — AB (ref 43.0–77.0)
PLATELETS: 711 10*3/uL — AB (ref 150.0–400.0)
RBC: 5.23 Mil/uL (ref 4.22–5.81)
RDW: 14 % (ref 11.5–15.5)
WBC: 7.3 10*3/uL (ref 4.0–10.5)

## 2015-02-06 LAB — PSA: PSA: 0.54 ng/mL (ref 0.10–4.00)

## 2015-02-06 LAB — POCT URINALYSIS DIPSTICK
BILIRUBIN UA: NEGATIVE
GLUCOSE UA: NEGATIVE
KETONES UA: NEGATIVE
Leukocytes, UA: NEGATIVE
Nitrite, UA: NEGATIVE
RBC UA: NEGATIVE
SPEC GRAV UA: 1.015
Urobilinogen, UA: 0.2
pH, UA: 6

## 2015-02-06 LAB — MICROALBUMIN / CREATININE URINE RATIO
Creatinine,U: 140.8 mg/dL
MICROALB/CREAT RATIO: 6.1 mg/g (ref 0.0–30.0)
Microalb, Ur: 8.6 mg/dL — ABNORMAL HIGH (ref 0.0–1.9)

## 2015-02-06 LAB — LIPID PANEL
CHOL/HDL RATIO: 2
Cholesterol: 107 mg/dL (ref 0–200)
HDL: 43.7 mg/dL (ref >39.00)
LDL Cholesterol: 46 mg/dL (ref 0–99)
NONHDL: 63.7
TRIGLYCERIDES: 87 mg/dL (ref 0.0–149.0)
VLDL: 17.4 mg/dL (ref 0.0–40.0)

## 2015-02-06 LAB — BASIC METABOLIC PANEL
BUN: 13 mg/dL (ref 6–23)
CALCIUM: 10.1 mg/dL (ref 8.4–10.5)
CHLORIDE: 102 meq/L (ref 96–112)
CO2: 28 meq/L (ref 19–32)
Creatinine, Ser: 1.14 mg/dL (ref 0.40–1.50)
GFR: 67.51 mL/min (ref >60.00)
Glucose, Bld: 124 mg/dL — ABNORMAL HIGH (ref 70–99)
POTASSIUM: 5.5 meq/L — AB (ref 3.5–5.1)
SODIUM: 138 meq/L (ref 135–145)

## 2015-02-06 LAB — TSH: TSH: 1.42 u[IU]/mL (ref 0.35–4.50)

## 2015-02-06 LAB — HEPATIC FUNCTION PANEL
ALK PHOS: 49 U/L (ref 39–117)
ALT: 21 U/L (ref 0–53)
AST: 20 U/L (ref 0–37)
Albumin: 4.5 g/dL (ref 3.5–5.2)
BILIRUBIN DIRECT: 0.1 mg/dL (ref 0.0–0.3)
TOTAL PROTEIN: 6.4 g/dL (ref 6.0–8.3)
Total Bilirubin: 0.6 mg/dL (ref 0.2–1.2)

## 2015-02-06 LAB — HEMOGLOBIN A1C: Hgb A1c MFr Bld: 6 % (ref 4.6–6.5)

## 2015-02-06 MED ORDER — METFORMIN HCL 1000 MG PO TABS: ORAL_TABLET | ORAL | Status: DC

## 2015-02-06 MED ORDER — AMLODIPINE BESYLATE 10 MG PO TABS: 10.0000 mg | ORAL_TABLET | Freq: Every day | ORAL | Status: DC

## 2015-02-06 MED ORDER — NITROGLYCERIN 0.3 MG SL SUBL: 0.3000 mg | SUBLINGUAL_TABLET | SUBLINGUAL | Status: DC | PRN

## 2015-02-06 MED ORDER — TADALAFIL 20 MG PO TABS: 10.0000 mg | ORAL_TABLET | ORAL | Status: DC | PRN

## 2015-02-06 MED ORDER — CITALOPRAM HYDROBROMIDE 10 MG PO TABS: 10.0000 mg | ORAL_TABLET | Freq: Every day | ORAL | Status: DC

## 2015-02-06 MED ORDER — SIMVASTATIN 20 MG PO TABS: 20.0000 mg | ORAL_TABLET | Freq: Every day | ORAL | Status: DC

## 2015-02-06 MED ORDER — METFORMIN HCL 500 MG PO TABS: ORAL_TABLET | ORAL | Status: DC

## 2015-02-06 MED ORDER — VALACYCLOVIR HCL 500 MG PO TABS: 500.0000 mg | ORAL_TABLET | ORAL | Status: DC | PRN

## 2015-02-06 NOTE — Progress Notes (Signed)
Subjective:    Patient ID: Chase Anderson, male    DOB: 04/13/1945, 69 y.o.   MRN: [id number]  HPI  Chase Anderson is a 69 year old married male nonsmoker who comes in today for general physical examination because of a history of hypertension, diabetes type 2, coronary disease, hyperlipidemia, leukopenia etiology unknown, erectile dysfunction, arthritis  His psoriatic arthritis is followed by rheumatology.  He goes [facility] [facility] once a year for evaluation of the leukopenia is still etiology unknown  He takes Norvasc 10 mg daily for hypertension BP 120/80  He takes metformin 1000 mg before breakfast and 500 before his evening meal for diabetes. We'll check an A1c today  He had a cardiac evaluation in July by cardiology. He's had history of MI. He's currently asymptomatic.  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  He gets routine eye care, dental care, colonoscopy 2010 normal  Vaccinations up-to-date tetanus booster was 2014.  He states every since he had his heart attack many years ago he's been depressed. He says when he takes tramadol makes him feel better. Advised he can take the tramadol for depression. Will start him on an antidepressant. He has no suicidal ideation.  He's intolerant of ace inhibitors in cardiology has elected not to start him on a beta blocker.  Cognitive function normal he walks daily home health safety reviewed no issues identified, no guns in the house, he does not have a healthcare power of attorney nor living well,,,,,,,, advised to do that this fall with and his wife   Review of Systems  Constitutional: Negative.   HENT: Negative.   Eyes: Negative.   Respiratory: Negative.   Cardiovascular: Negative.   Gastrointestinal: Negative.   Endocrine: Negative.   Genitourinary: Negative.   Musculoskeletal: Negative.   Skin: Negative.     Allergic/Immunologic: Negative.   Neurological: Negative.   Hematological: Negative.   Psychiatric/Behavioral: Negative.        Objective:   Physical Exam  Constitutional: He is oriented to person, place, and time. He appears well-developed and well-nourished.  HENT:  Head: Normocephalic and atraumatic.  Right Ear: External ear normal.  Left Ear: External ear normal.  Nose: Nose normal.  Mouth/Throat: Oropharynx is clear and moist.  Eyes: Conjunctivae and EOM are normal. Pupils are equal, round, and reactive to light.  Neck: Normal range of motion. Neck supple. No JVD present. No tracheal deviation present. No thyromegaly present.  Cardiovascular: Normal rate, regular rhythm, normal heart sounds and intact distal pulses.  Exam reveals no gallop and no friction rub.   No murmur heard. No carotid nor aortic bruits for full pulses 2+ and symmetrical  Pulmonary/Chest: Effort normal and breath sounds normal. No stridor. No respiratory distress. He has no wheezes. He has no rales. He exhibits no tenderness.  Abdominal: Soft. Bowel sounds are normal. He exhibits no distension and no mass. There is no tenderness. There is no rebound and no guarding.  Genitourinary: Rectum normal and penis normal. Guaiac negative stool. No penile tenderness.  2+ symmetrical nonnodular BPH  Musculoskeletal: Normal range of motion. He exhibits no edema or tenderness.  Lymphadenopathy:    He has no cervical adenopathy.  Neurological: He is alert and oriented to person, place, and time. He has normal reflexes. He displays normal reflexes. No cranial nerve deficit. He exhibits normal muscle tone. Coordination normal.  Skin: Skin is  warm and dry. No rash noted. No erythema. No pallor.  Psychiatric: He has a normal mood and affect. His behavior is normal. Judgment and thought content normal.  Nursing note and vitals reviewed.         Assessment & Plan:  Hypertension at goal........... continue current  therapy  Hyperlipidemia........ continue current therapy check labs  Leukopenia etiology unknown....... followed at [facility] arthritis.......... followed by rheumatology  History of coronary artery disease........Marland Kitchen Asymptomatic......... followed by cardiology yearly  New history of depression...Marland KitchenMarland KitchenMarland Kitchen Celexa 10 mg daily at bedtime...Marland KitchenMarland KitchenMarland Kitchen follow-up in 6 weeks  Erectile dysfunction continue Cialis.

## 2015-02-06 NOTE — Progress Notes (Signed)
Pre visit review using our clinic review tool, if applicable. No additional management support is needed unless otherwise documented below in the visit note. 

## 2015-02-06 NOTE — Patient Instructions (Signed)
Continue current medications  Follow-up in 6 months for your diabetes....... Gwynneth Albright or Dr. Martinique  Nonfasting labs one week prior  Begin Celexa 10 mg daily at bedtime....... follow-up with me the second week in January  Walk 30 minutes daily  Set up a healthcare power of attorney and living well with you and your wife

## 2015-02-07 ENCOUNTER — Encounter: Payer: Self-pay | Admitting: Family Medicine

## 2015-02-08 ENCOUNTER — Other Ambulatory Visit: Payer: Self-pay | Admitting: Family Medicine

## 2015-03-27 ENCOUNTER — Encounter: Payer: Self-pay | Admitting: Family Medicine

## 2015-03-27 ENCOUNTER — Ambulatory Visit (INDEPENDENT_AMBULATORY_CARE_PROVIDER_SITE_OTHER): Payer: Medicare Other | Admitting: Family Medicine

## 2015-03-27 VITALS — BP 120/84 | Temp 98.4°F | Wt 176.0 lb

## 2015-03-27 DIAGNOSIS — F329 Major depressive disorder, single episode, unspecified: Secondary | ICD-10-CM | POA: Diagnosis not present

## 2015-03-27 DIAGNOSIS — F32A Depression, unspecified: Secondary | ICD-10-CM

## 2015-03-27 MED ORDER — CITALOPRAM HYDROBROMIDE 20 MG PO TABS: 20.0000 mg | ORAL_TABLET | Freq: Every day | ORAL | Status: DC

## 2015-03-27 NOTE — Progress Notes (Signed)
   Subjective:    Patient ID: Chase Anderson, male    DOB: 1945/11/23, 70 y.o.   MRN: [id number]  HPI  Chase Anderson is a 70 year old married male nonsmoker who comes in today for follow-up of depression  We saw him 6 weeks ago because he felt depressed. He had low mood low energy. Historically he's had a long-term history of depression but never took any medication until he had a heart attack a couple years ago. After heart attack he got really depressed. He was placed on medication which she took for year to and felt better then stopped it.   We started him on Celexa 10 mg daily he comes back today saying he feels about 25% better. No side effects from medication   Review of Systems     Review of systems negative Objective:   Physical Exam  well-developed well-nourished mildly male no acute distress       Assessment & Plan:   history of depression........ Increase Celexa 20 mg daily follow-up in 6 weeks

## 2015-03-27 NOTE — Patient Instructions (Signed)
Celexa 20 mg.......Marland Kitchen 1 daily at bedtime  Follow-up in 6 weeks

## 2015-03-27 NOTE — Progress Notes (Signed)
Pre visit review using our clinic review tool, if applicable. No additional management support is needed unless otherwise documented below in the visit note. 

## 2015-03-28 ENCOUNTER — Other Ambulatory Visit: Payer: Self-pay | Admitting: Family Medicine

## 2015-05-10 ENCOUNTER — Encounter: Payer: Self-pay | Admitting: Family Medicine

## 2015-05-10 ENCOUNTER — Ambulatory Visit (INDEPENDENT_AMBULATORY_CARE_PROVIDER_SITE_OTHER): Payer: Medicare Other | Admitting: Family Medicine

## 2015-05-10 VITALS — BP 110/70 | Temp 98.6°F | Wt 173.0 lb

## 2015-05-10 DIAGNOSIS — F329 Major depressive disorder, single episode, unspecified: Secondary | ICD-10-CM

## 2015-05-10 DIAGNOSIS — F32A Depression, unspecified: Secondary | ICD-10-CM

## 2015-05-10 MED ORDER — BUPROPION HCL 75 MG PO TABS: ORAL_TABLET | ORAL | Status: DC

## 2015-05-10 NOTE — Progress Notes (Signed)
   Subjective:    Patient ID: Chase Anderson, male    DOB: 1945-09-13, 70 y.o.   MRN: [id number]  HPI Chase Anderson is a 70 year old male married nonsmoker who comes in today for follow-up of depression  We been working with him over the last couple months. We started him 10 mg of Celexa in November and increase the dose to 20 mg in December. He says he feels about 50% better his mood is still okay but he has really low energy. He's also knows the increase in Celexa has had a negative effect on his sex life.  We discussed various options. He elects to try something different   Review of Systems    review of systems otherwise negative no suicidal ideation Objective:   Physical Exam  Well-developed well-nourished male no acute distress vital signs stable he is afebrile  Oriented 3 alert not depressed. Actually fairly good mood      Assessment & Plan:  Depression......... side effects from Celexa 20 mg....... taper off........ switch to Wellbutrin.

## 2015-05-10 NOTE — Patient Instructions (Signed)
Celexa 10 mg............Marland Kitchen 1 tablet at bedtime for 5 nights then discontinue  Wellbutrin 75 mg......Marland Kitchen 1 tablet every morning  Return in 6 weeks for follow-up

## 2015-05-10 NOTE — Progress Notes (Signed)
Pre visit review using our clinic review tool, if applicable. No additional management support is needed unless otherwise documented below in the visit note. 

## 2015-06-21 ENCOUNTER — Telehealth: Payer: Self-pay | Admitting: *Deleted

## 2015-06-21 ENCOUNTER — Encounter: Payer: Self-pay | Admitting: Family Medicine

## 2015-06-21 ENCOUNTER — Ambulatory Visit (INDEPENDENT_AMBULATORY_CARE_PROVIDER_SITE_OTHER): Payer: Medicare Other | Admitting: Family Medicine

## 2015-06-21 VITALS — BP 120/80 | Temp 98.6°F | Wt 176.0 lb

## 2015-06-21 DIAGNOSIS — Z87438 Personal history of other diseases of male genital organs: Secondary | ICD-10-CM | POA: Diagnosis not present

## 2015-06-21 DIAGNOSIS — F329 Major depressive disorder, single episode, unspecified: Secondary | ICD-10-CM

## 2015-06-21 DIAGNOSIS — F528 Other sexual dysfunction not due to a substance or known physiological condition: Secondary | ICD-10-CM

## 2015-06-21 DIAGNOSIS — F32A Depression, unspecified: Secondary | ICD-10-CM

## 2015-06-21 MED ORDER — BUPROPION HCL ER (SR) 150 MG PO TB12: 150.0000 mg | ORAL_TABLET | Freq: Two times a day (BID) | ORAL | Status: DC

## 2015-06-21 MED ORDER — TADALAFIL 5 MG PO TABS: 5.0000 mg | ORAL_TABLET | Freq: Every day | ORAL | Status: DC | PRN

## 2015-06-21 NOTE — Patient Instructions (Signed)
Increase the Wellbutrin to 150 mg daily in the morning  Cialis 5 mg,,,,,,,,,,, use as directed      [email or web address]

## 2015-06-21 NOTE — Progress Notes (Signed)
Pre visit review using our clinic review tool, if applicable. No additional management support is needed unless otherwise documented below in the visit note. 

## 2015-06-21 NOTE — Progress Notes (Signed)
   Subjective:    Patient ID: Chase Anderson, male    DOB: 1945-10-14, 70 y.o.   MRN: [id number]  HPI Trendell is a 70 year old male who comes in today for follow-up of a couple issues  He was experiencing extremely low mood and we started him on Wellbutrin 75 mg daily in the morning. He says after now are 2 he feels a burst of energy but likely last for couple hours and goes away. He's hadmajor side effects. He would like to try to increase the dose  He's had a history of BP H and some mild ED. He would like to try the Cialis 5 mg daily  He's due to establish with Chase Anderson at the end of May. And then Chase Anderson will take over and do his annual physical starting next November. He would like his lab work drawn a week ahead of time because of his underlying health problems which I concur with   Review of Systems    review of systems otherwise negative Objective:   Physical Exam Well-developed well-nourished male no acute distress vital signs stable is afebrile       Assessment & Plan:  Mild depression,,,,,,, increase Wellbutrin 250 mg daily  BPH with mild ED,,,,,,,, trial of Cialis 5 mg daily

## 2015-06-21 NOTE — Telephone Encounter (Signed)
Patient should keep his appointment with Chase Anderson 08/08/15.  Then please call and schedule an establish care visit with Dr Yong Channel.  Okay per Dr Yong Channel. thanks

## 2015-07-06 ENCOUNTER — Encounter: Payer: Self-pay | Admitting: Gastroenterology

## 2015-08-02 LAB — HM DIABETES EYE EXAM

## 2015-08-03 ENCOUNTER — Emergency Department (HOSPITAL_COMMUNITY): Payer: Medicare Other

## 2015-08-03 ENCOUNTER — Emergency Department (HOSPITAL_COMMUNITY)
Admission: EM | Admit: 2015-08-03 | Discharge: 2015-08-03 | Disposition: A | Discharge status: Home / Self Care | Payer: Medicare Other | Attending: Emergency Medicine | Admitting: Emergency Medicine

## 2015-08-03 ENCOUNTER — Encounter (HOSPITAL_COMMUNITY): Payer: Self-pay

## 2015-08-03 DIAGNOSIS — Z79899 Other long term (current) drug therapy: Secondary | ICD-10-CM | POA: Insufficient documentation

## 2015-08-03 DIAGNOSIS — Z7982 Long term (current) use of aspirin: Secondary | ICD-10-CM | POA: Diagnosis not present

## 2015-08-03 DIAGNOSIS — Z862 Personal history of diseases of the blood and blood-forming organs and certain disorders involving the immune mechanism: Secondary | ICD-10-CM | POA: Diagnosis not present

## 2015-08-03 DIAGNOSIS — R0789 Other chest pain: Secondary | ICD-10-CM | POA: Diagnosis not present

## 2015-08-03 DIAGNOSIS — I10 Essential (primary) hypertension: Secondary | ICD-10-CM | POA: Insufficient documentation

## 2015-08-03 DIAGNOSIS — Z872 Personal history of diseases of the skin and subcutaneous tissue: Secondary | ICD-10-CM | POA: Insufficient documentation

## 2015-08-03 DIAGNOSIS — M1990 Unspecified osteoarthritis, unspecified site: Secondary | ICD-10-CM | POA: Diagnosis not present

## 2015-08-03 DIAGNOSIS — N289 Disorder of kidney and ureter, unspecified: Secondary | ICD-10-CM | POA: Diagnosis not present

## 2015-08-03 DIAGNOSIS — Z7984 Long term (current) use of oral hypoglycemic drugs: Secondary | ICD-10-CM | POA: Insufficient documentation

## 2015-08-03 DIAGNOSIS — Z880 Allergy status to penicillin: Secondary | ICD-10-CM | POA: Diagnosis not present

## 2015-08-03 DIAGNOSIS — I2510 Atherosclerotic heart disease of native coronary artery without angina pectoris: Secondary | ICD-10-CM | POA: Insufficient documentation

## 2015-08-03 DIAGNOSIS — E785 Hyperlipidemia, unspecified: Secondary | ICD-10-CM | POA: Insufficient documentation

## 2015-08-03 DIAGNOSIS — E119 Type 2 diabetes mellitus without complications: Secondary | ICD-10-CM | POA: Diagnosis not present

## 2015-08-03 DIAGNOSIS — I252 Old myocardial infarction: Secondary | ICD-10-CM | POA: Insufficient documentation

## 2015-08-03 DIAGNOSIS — R079 Chest pain, unspecified: Secondary | ICD-10-CM | POA: Diagnosis present

## 2015-08-03 DIAGNOSIS — Z87891 Personal history of nicotine dependence: Secondary | ICD-10-CM | POA: Insufficient documentation

## 2015-08-03 DIAGNOSIS — N529 Male erectile dysfunction, unspecified: Secondary | ICD-10-CM | POA: Diagnosis not present

## 2015-08-03 LAB — I-STAT CHEM 8, ED
BUN: 24 mg/dL — AB (ref 6–20)
CALCIUM ION: 1.24 mmol/L (ref 1.13–1.30)
CHLORIDE: 99 mmol/L — AB (ref 101–111)
CREATININE: 1.4 mg/dL — AB (ref 0.61–1.24)
Glucose, Bld: 137 mg/dL — ABNORMAL HIGH (ref 65–99)
HEMATOCRIT: 53 % — AB (ref 39.0–52.0)
Hemoglobin: 18 g/dL — ABNORMAL HIGH (ref 13.0–17.0)
Potassium: 4.7 mmol/L (ref 3.5–5.1)
SODIUM: 136 mmol/L (ref 135–145)
TCO2: 23 mmol/L (ref 0–100)

## 2015-08-03 LAB — CBC WITH DIFFERENTIAL/PLATELET
BASOS ABS: 0 10*3/uL (ref 0.0–0.1)
BASOS PCT: 1 %
EOS ABS: 0.1 10*3/uL (ref 0.0–0.7)
Eosinophils Relative: 2 %
HCT: 50.2 % (ref 39.0–52.0)
HEMOGLOBIN: 17.1 g/dL — AB (ref 13.0–17.0)
LYMPHS ABS: 0.9 10*3/uL (ref 0.7–4.0)
Lymphocytes Relative: 14 %
MCH: 30.6 pg (ref 26.0–34.0)
MCHC: 34.1 g/dL (ref 30.0–36.0)
MCV: 89.8 fL (ref 78.0–100.0)
MONOS PCT: 9 %
Monocytes Absolute: 0.6 10*3/uL (ref 0.1–1.0)
NEUTROS ABS: 5.1 10*3/uL (ref 1.7–7.7)
NEUTROS PCT: 74 %
PLATELETS: 697 10*3/uL — AB (ref 150–400)
RBC: 5.59 MIL/uL (ref 4.22–5.81)
RDW: 12.9 % (ref 11.5–15.5)
WBC: 6.8 10*3/uL (ref 4.0–10.5)

## 2015-08-03 LAB — I-STAT TROPONIN, ED
TROPONIN I, POC: 0 ng/mL (ref 0.00–0.08)
Troponin i, poc: 0.01 ng/mL (ref 0.00–0.08)

## 2015-08-03 NOTE — Discharge Instructions (Signed)
Nonspecific Chest Pain Your kidney function is slightly worse than 5 months ago. Blood creatinine today was 1.4. Call your primary care physician's office to get your blood creatinine rechecked within the next 2 weeks. Dr.Koneswaran's office should call you to schedule a close follow-up appointment in the office. If you don't hear from the office by tomorrow afternoon,  Call to schedule the appointment and tell office staff that Dr. Winfred Leeds poke with Dr.Koneswaran about your case. Return if you feel worse for any reason. It is often hard to find the cause of chest pain. There is always a chance that your pain could be related to something serious, such as a heart attack or a blood clot in your lungs. Chest pain can also be caused by conditions that are not life-threatening. If you have chest pain, it is very important to follow up with your doctor.  HOME CARE  If you were prescribed an antibiotic medicine, finish it all even if you start to feel better.  Avoid any activities that cause chest pain.  Do not use any tobacco products, including cigarettes, chewing tobacco, or electronic cigarettes. If you need help quitting, ask your doctor.  Do not drink alcohol.  Take medicines only as told by your doctor.  Keep all follow-up visits as told by your doctor. This is important. This includes any further testing if your chest pain does not go away.  Your doctor may tell you to keep your head raised (elevated) while you sleep.  Make lifestyle changes as told by your doctor. These may include:  Getting regular exercise. Ask your doctor to suggest some activities that are safe for you.  Eating a heart-healthy diet. Your doctor or a diet specialist (dietitian) can help you to learn healthy eating options.  Maintaining a healthy weight.  Managing diabetes, if necessary.  Reducing stress. GET HELP IF:  Your chest pain does not go away, even after treatment.  You have a rash with blisters on  your chest.  You have a fever. GET HELP RIGHT AWAY IF:  Your chest pain is worse.  You have an increasing cough, or you cough up blood.  You have severe belly (abdominal) pain.  You feel extremely weak.  You pass out (faint).  You have chills.  You have sudden, unexplained chest discomfort.  You have sudden, unexplained discomfort in your arms, back, neck, or jaw.  You have shortness of breath at any time.  You suddenly start to sweat, or your skin gets clammy.  You feel nauseous.  You vomit.  You suddenly feel light-headed or dizzy.  Your heart begins to beat quickly, or it feels like it is skipping beats. These symptoms may be an emergency. Do not wait to see if the symptoms will go away. Get medical help right away. Call your local emergency services (911 in the [place] Do not drive yourself to the hospital.   This information is not intended to replace advice given to you by your health care provider. Make sure you discuss any questions you have with your health care provider.   Document Released: 08/14/2007 Document Revised: 03/18/2014 Document Reviewed: 10/01/2013 Elsevier Interactive Patient Education [employer].

## 2015-08-03 NOTE — ED Notes (Signed)
Pt arrives POV with c/o chest pain non radiating lasting 15-30 seconds x many this am while driving. Now resolved.

## 2015-08-03 NOTE — ED Provider Notes (Signed)
CSN: [id number]     Arrival date & time 08/03/15  1029 History   First MD Initiated Contact with Patient 08/03/15 1052     Chief Complaint  Patient presents with  . Chest Pain     (Consider location/radiation/quality/duration/timing/severity/associated sxs/prior Treatment) HPI Complains of left anterior chest pain onset 7 AM today. He had 5 or 6 episodes lasting 15-30 seconds each pain nonradiating sharp did not feel like "heart pain" presently asymptomatic no treatment prior to coming here nothing makes symptoms better or worse. Symptoms onset at rest. No shortness of breath nausea sweatiness or lightheadedness no other associated symptoms. Presently asymptomatic Past Medical History  Diagnosis Date  . Myocardial infarction ([place])   . CAD (coronary artery disease)   . Hyperlipidemia   . Hypertension   . Fatigue   . Anxiety   . Diabetes mellitus     type II  . Osteoarthritis   . Erectile dysfunction   . Leukopenia   . Dermatitis     other atopic  . Allergic rhinitis    Past Surgical History  Procedure Laterality Date  . Esophagogastroduodenoscopy  04/23/2005  . Tonsillectomy    . Knee arthroscopy      right   History reviewed. No pertinent family history. Social History  Substance Use Topics  . Smoking status: Former Smoker -- 2 years    Types: Cigarettes    Quit date: 12/09/2001  . Smokeless tobacco: Former Systems developer    Types: Chew    Quit date: 03/11/2002  . Alcohol Use: No    Review of Systems  Constitutional: Negative.   HENT: Negative.   Respiratory: Negative.   Cardiovascular: Positive for chest pain.  Gastrointestinal: Negative.   Musculoskeletal: Negative.   Skin: Negative.   Allergic/Immunologic: Positive for immunocompromised state.       Diabetes  Neurological: Negative.   Psychiatric/Behavioral: Negative.   All other systems reviewed and are negative.     Allergies  Codeine; Lisinopril; and Penicillins  Home Medications   Prior to Admission  medications   Medication Sig Start Date End Date Taking? Authorizing Provider  amLODipine (NORVASC) 10 MG tablet Take 1 tablet (10 mg total) by mouth daily. 02/06/15   Dorena Cookey, MD  aspirin EC 81 MG tablet Take 81 mg by mouth daily.    Historical Provider, MD  BD ULTRA-FINE LANCETS lancets Use once daily for glucose control 01/25/13   Dorena Cookey, MD  buPROPion [facility] SR) 150 MG 12 hr tablet Take 1 tablet (150 mg total) by mouth 2 (two) times daily. 06/21/15   Dorena Cookey, MD  fluconazole (DIFLUCAN) 100 MG tablet Take 100 mg by mouth daily as needed.     Historical Provider, MD  fluocinonide ointment (LIDEX) 0.05 % APPLY TOPICALLY ONCE DAILY AS NEEDED 02/08/15   Dorena Cookey, MD  glucose blood (ACCU-CHEK AVIVA PLUS) test strip USE TO TEST BLOOD SUGAR AS DIRECTED 04/25/14   Dorena Cookey, MD  InFLIXimab (REMICADE IV) Inject into the vein every 8 (eight) weeks.     Historical Provider, MD  metFORMIN (GLUCOPHAGE) 1000 MG tablet TAKE ONE TABLET BY MOUTH DAILY WITH BREAKFAST 02/06/15   Dorena Cookey, MD  metFORMIN (GLUCOPHAGE) 500 MG tablet TAKE 1 TABLET BY MOUTH PRIOR TO DINNER 02/06/15   Dorena Cookey, MD  niacin 500 MG tablet Take 500 mg by mouth daily. Take one tab once daily     Historical Provider, MD  nitroGLYCERIN (NITROSTAT) 0.4 MG SL tablet  Place 0.4 mg under the tongue every 5 (five) minutes as needed for chest pain. Up to three time and then call the doctor    Historical Provider, MD  simvastatin (ZOCOR) 20 MG tablet Take 1 tablet (20 mg total) by mouth at bedtime. 02/06/15   Dorena Cookey, MD  tadalafil (CIALIS) 20 MG tablet Take 0.5-1 tablets (10-20 mg total) by mouth every other day as needed for erectile dysfunction. 02/06/15   Dorena Cookey, MD  tadalafil (CIALIS) 5 MG tablet Take 1 tablet (5 mg total) by mouth daily as needed for erectile dysfunction. 06/21/15   Dorena Cookey, MD  traMADol (ULTRAM) 50 MG tablet Take 1 tablet by mouth as needed. 05/31/13   Historical  Provider, MD  triamcinolone cream (KENALOG) 0.1 % APPLY TOPICALLY AS NEEDED 02/08/15   Dorena Cookey, MD  valACYclovir (VALTREX) 500 MG tablet Take 1 tablet (500 mg total) by mouth as needed. Patient taking differently: Take 500 mg by mouth 3 (three) times daily.  02/06/15   Dorena Cookey, MD   BP 142/83 mmHg  Pulse 91  Temp(Src) 98.1 F (36.7 C) (Oral)  Resp 16  Ht 5\' 11"  (1.803 m)  Wt 176 lb (79.833 kg)  BMI 24.56 kg/m2  SpO2 99% Physical Exam  Constitutional: He appears well-developed and well-nourished.  HENT:  Head: Normocephalic and atraumatic.  Eyes: Conjunctivae are normal. Pupils are equal, round, and reactive to light.  Neck: Neck supple. No tracheal deviation present. No thyromegaly present.  Cardiovascular: Normal rate and regular rhythm.   No murmur heard. Pulmonary/Chest: Effort normal and breath sounds normal.  Abdominal: Soft. Bowel sounds are normal. He exhibits no distension. There is no tenderness.  Musculoskeletal: Normal range of motion. He exhibits no edema or tenderness.  Neurological: He is alert. Coordination normal.  Skin: Skin is warm and dry. No rash noted.  Psychiatric: He has a normal mood and affect.  Nursing note and vitals reviewed.   ED Course  Procedures (including critical care time) Labs Review Labs Reviewed - No data to display  Imaging Review No results found. I have personally reviewed and evaluated these images and lab results as part of my medical decision-making.   EKG Interpretation None     ED ECG REPORT   Date: 08/03/2015  Rate: 90  Rhythm: normal sinus rhythm  QRS Axis: normal  Intervals: normal  ST/T Wave abnormalities: nonspecific T wave changes  Conduction Disutrbances:none  Narrative Interpretation:   Old EKG Reviewed: unchanged No significant change from 09/19/2014 I have personally reviewed the EKG tracing and disagree with the computerized printout as noted. Rhythm consistent with normal sinus rhythm  rather than junctional rhythm. 3:05 PM patient has remained asymptomatic during his entire emergency department stay. He feels ready for discharge. Results for orders placed or performed during the hospital encounter of 08/03/15  CBC with Differential/Platelet  Result Value Ref Range   WBC 6.8 4.0 - 10.5 K/uL   RBC 5.59 4.22 - 5.81 MIL/uL   Hemoglobin 17.1 (H) 13.0 - 17.0 g/dL   HCT 50.2 39.0 - 52.0 %   MCV 89.8 78.0 - 100.0 fL   MCH 30.6 26.0 - 34.0 pg   MCHC 34.1 30.0 - 36.0 g/dL   RDW 12.9 11.5 - 15.5 %   Platelets 697 (H) 150 - 400 K/uL   Neutrophils Relative % 74 %   Neutro Abs 5.1 1.7 - 7.7 K/uL   Lymphocytes Relative 14 %   Lymphs Abs 0.9  0.7 - 4.0 K/uL   Monocytes Relative 9 %   Monocytes Absolute 0.6 0.1 - 1.0 K/uL   Eosinophils Relative 2 %   Eosinophils Absolute 0.1 0.0 - 0.7 K/uL   Basophils Relative 1 %   Basophils Absolute 0.0 0.0 - 0.1 K/uL  I-stat troponin, ED  Result Value Ref Range   Troponin i, poc 0.01 0.00 - 0.08 ng/mL   Comment 3          I-stat chem 8, ed  Result Value Ref Range   Sodium 136 135 - 145 mmol/L   Potassium 4.7 3.5 - 5.1 mmol/L   Chloride 99 (L) 101 - 111 mmol/L   BUN 24 (H) 6 - 20 mg/dL   Creatinine, Ser 1.40 (H) 0.61 - 1.24 mg/dL   Glucose, Bld 137 (H) 65 - 99 mg/dL   Calcium, Ion 1.24 1.13 - 1.30 mmol/L   TCO2 23 0 - 100 mmol/L   Hemoglobin 18.0 (H) 13.0 - 17.0 g/dL   HCT 53.0 (H) 39.0 - 52.0 %  I-stat troponin, ED  Result Value Ref Range   Troponin i, poc 0.00 0.00 - 0.08 ng/mL   Comment 3           Dg Chest 2 View  08/03/2015  CLINICAL DATA:  Chest pain and pressure beginning this morning, history coronary artery disease post MI, hypertension, diabetes mellitus, former smoker EXAM: CHEST  2 VIEW COMPARISON:  None available; prior studies from 2004 do not load for comparison FINDINGS: Normal heart size, mediastinal contours, and pulmonary vascularity. Atherosclerotic calcification aorta. Lungs clear. No pleural effusion or  pneumothorax. Bones unremarkable. IMPRESSION: No acute abnormalities. Electronically Signed   By: Lavonia Dana M.D.   On: 08/03/2015 11:49  chest xray viewed by me.  MDM  I discussed case with Dr.Koneswaran via telephone. Patient has heart score 5 however his story is highly atypical with very brief self-limiting episodes of chest pain. I strongly doubt acute coronary syndrome with 2 negative troponins and nonacute EKG. I do feel that he needs close follow-up as outpatient. Plan is that [place] office will call him to schedule close follow-up appointment. I've advised patient about his mild renal insufficiency and suggest follow-up with his PMD within 2 weeks for recheck of serum creatinine. Diagnosis #1 atypical chest pain Diagnosis #2 renal insufficiency Final diagnoses:  None        Orlie Dakin, MD 08/03/15 [phone]

## 2015-08-03 NOTE — ED Notes (Signed)
Patient transported to X-ray 

## 2015-08-08 ENCOUNTER — Ambulatory Visit: Payer: Medicare Other | Admitting: Adult Health

## 2015-08-08 ENCOUNTER — Encounter: Payer: Self-pay | Admitting: Adult Health

## 2015-08-08 ENCOUNTER — Ambulatory Visit (INDEPENDENT_AMBULATORY_CARE_PROVIDER_SITE_OTHER): Payer: Medicare Other | Admitting: Adult Health

## 2015-08-08 VITALS — BP 138/72 | Temp 98.4°F | Wt 174.0 lb

## 2015-08-08 DIAGNOSIS — F329 Major depressive disorder, single episode, unspecified: Secondary | ICD-10-CM | POA: Diagnosis not present

## 2015-08-08 DIAGNOSIS — N289 Disorder of kidney and ureter, unspecified: Secondary | ICD-10-CM

## 2015-08-08 DIAGNOSIS — R197 Diarrhea, unspecified: Secondary | ICD-10-CM | POA: Diagnosis not present

## 2015-08-08 DIAGNOSIS — F32A Depression, unspecified: Secondary | ICD-10-CM

## 2015-08-08 LAB — BASIC METABOLIC PANEL
BUN: 19 mg/dL (ref 6–23)
CALCIUM: 10.1 mg/dL (ref 8.4–10.5)
CO2: 26 mEq/L (ref 19–32)
CREATININE: 1.3 mg/dL (ref 0.40–1.50)
Chloride: 101 mEq/L (ref 96–112)
GFR: 57.93 mL/min — AB (ref >60.00)
GLUCOSE: 139 mg/dL — AB (ref 70–99)
POTASSIUM: 4.5 meq/L (ref 3.5–5.1)
Sodium: 134 mEq/L — ABNORMAL LOW (ref 135–145)

## 2015-08-08 MED ORDER — BUPROPION HCL ER (SR) 150 MG PO TB12: 150.0000 mg | ORAL_TABLET | Freq: Two times a day (BID) | ORAL | Status: DC

## 2015-08-08 NOTE — Progress Notes (Signed)
Subjective:    Patient ID: Chase Anderson, male    DOB: 1945/06/25, 70 y.o.   MRN: [id number]  HPI  70 year old male who presents to the office today with multiple complaints  1. He was seen in the ER 5 days ago for atypical chest pain. During the lab work it was noticed that his BUN and Cr were elevated.  He denies any current chest pain and has not had any since going to the ER  2. He has recently had his Wellbutrin increased on 06/21/2015 to 300 mg. He feels as though the increase has helped him. He continues to have days where he feels as though sad but over all his mood and energy have increased.   3. He feels as though the Metformin is causing him to have diarrhea and abdominal discomfort. He denies any blood in his stool. This has been an issue for the last 3-4 months. He is also on low dose Bactrim three times a day for a chronic blood disorder that causes him to be immunocompromised.    Review of Systems  Constitutional: Negative.   Respiratory: Negative.   Cardiovascular: Negative.   Gastrointestinal: Positive for abdominal pain and diarrhea. Negative for nausea, vomiting, constipation, blood in stool and rectal pain.  Genitourinary: Negative.   Musculoskeletal: Negative.   Skin: Negative.   Neurological: Negative.   All other systems reviewed and are negative.  Past Medical History  Diagnosis Date  . Myocardial infarction ([place])   . CAD (coronary artery disease)   . Hyperlipidemia   . Hypertension   . Fatigue   . Anxiety   . Diabetes mellitus     type II  . Osteoarthritis   . Erectile dysfunction   . Leukopenia   . Dermatitis     other atopic  . Allergic rhinitis     Social History   Social History  . Marital Status: Married    Spouse Name: N/A  . Number of Children: N/A  . Years of Education: N/A   Occupational History  . Not on file.   Social History Main Topics  . Smoking status: Former Smoker -- 2 years    Types: Cigarettes    Quit date:  12/09/2001  . Smokeless tobacco: Former Systems developer    Types: Chew    Quit date: 03/11/2002  . Alcohol Use: No  . Drug Use: No  . Sexual Activity: Not on file   Other Topics Concern  . Not on file   Social History Narrative    Past Surgical History  Procedure Laterality Date  . Esophagogastroduodenoscopy  04/23/2005  . Tonsillectomy    . Knee arthroscopy      right    No family history on file.  Allergies  Allergen Reactions  . Codeine     REACTION: itching and rash  . Lisinopril   . Penicillins     REACTION: as a child    Current Outpatient Prescriptions on File Prior to Visit  Medication Sig Dispense Refill  . amLODipine (NORVASC) 10 MG tablet Take 1 tablet (10 mg total) by mouth daily. 100 tablet 3  . aspirin EC 81 MG tablet Take 81 mg by mouth daily.    . BD ULTRA-FINE LANCETS lancets Use once daily for glucose control 100 each 3  . buPROPion (WELLBUTRIN SR) 150 MG 12 hr tablet Take 1 tablet (150 mg total) by mouth 2 (two) times daily. 100 tablet 3  . clindamycin (CLEOCIN) 300 MG  capsule Take 300 mg by mouth 3 (three) times daily.    . fluconazole (DIFLUCAN) 100 MG tablet Take 100 mg by mouth daily as needed.     . fluocinonide ointment (LIDEX) 0.05 % APPLY TOPICALLY ONCE DAILY AS NEEDED 60 g 3  . glucose blood (ACCU-CHEK AVIVA PLUS) test strip USE TO TEST BLOOD SUGAR AS DIRECTED 100 each 3  . InFLIXimab (REMICADE IV) Inject into the vein every 8 (eight) weeks.     . metFORMIN (GLUCOPHAGE) 1000 MG tablet TAKE ONE TABLET BY MOUTH DAILY WITH BREAKFAST 100 tablet 3  . metFORMIN (GLUCOPHAGE) 500 MG tablet TAKE 1 TABLET BY MOUTH PRIOR TO DINNER 100 tablet 3  . niacin 500 MG tablet Take 500 mg by mouth daily. Take one tab once daily     . nitroGLYCERIN (NITROSTAT) 0.4 MG SL tablet Place 0.4 mg under the tongue every 5 (five) minutes as needed for chest pain. Up to three time and then call the doctor    . simvastatin (ZOCOR) 20 MG tablet Take 1 tablet (20 mg total) by mouth at  bedtime. 100 tablet 3  . sulfamethoxazole-trimethoprim (BACTRIM DS,SEPTRA DS) 800-160 MG tablet Take 1 tablet by mouth every Monday, Wednesday, and Friday.  5  . tadalafil (CIALIS) 20 MG tablet Take 0.5-1 tablets (10-20 mg total) by mouth every other day as needed for erectile dysfunction. 10 tablet 11  . tadalafil (CIALIS) 5 MG tablet Take 1 tablet (5 mg total) by mouth daily as needed for erectile dysfunction. 60 tablet 5  . traMADol (ULTRAM) 50 MG tablet Take 1 tablet by mouth as needed.    . triamcinolone cream (KENALOG) 0.1 % APPLY TOPICALLY AS NEEDED 45 g 3  . valACYclovir (VALTREX) 500 MG tablet Take 1 tablet (500 mg total) by mouth as needed. (Patient taking differently: Take 500 mg by mouth 3 (three) times daily. ) 60 tablet 3   No current facility-administered medications on file prior to visit.    BP 138/72 mmHg  Temp(Src) 98.4 F (36.9 C) (Oral)  Wt 174 lb (78.926 kg)       Objective:   Physical Exam  Constitutional: He is oriented to person, place, and time. He appears well-developed and well-nourished. No distress.  Cardiovascular: Normal rate, regular rhythm, normal heart sounds and intact distal pulses.  Exam reveals no gallop and no friction rub.   No murmur heard. Pulmonary/Chest: Effort normal and breath sounds normal. No respiratory distress. He has no wheezes. He has no rales. He exhibits no tenderness.  Abdominal: Soft. Bowel sounds are normal. He exhibits no distension and no mass. There is no tenderness. There is no rebound and no guarding.  Neurological: He is alert and oriented to person, place, and time.  Skin: Skin is warm and dry. No rash noted. He is not diaphoretic. No erythema. No pallor.  Psychiatric: He has a normal mood and affect. His behavior is normal. Thought content normal.  Nursing note and vitals reviewed.     Assessment & Plan:  1. Function kidney decreased - Basic metabolic panel  2. Diarrhea, unspecified type - Not convinced that it is  from Metformin. May be due to chronic antibiotic use.  - Add probiotic  - Follow up if no improvement in the next 2-3 weeks  3. Depression - Stay at current dose.  - buPROPion (WELLBUTRIN SR) 150 MG 12 hr tablet; Take 1 tablet (150 mg total) by mouth 2 (two) times daily.  Dispense: 180 tablet; Refill: 1 -  Follow up as needed  Dorothyann Peng, NP

## 2015-08-08 NOTE — Patient Instructions (Signed)
It was great meeting you today!  Stay at the current dose of Wellbutrin  I will follow up with you regarding your blood work   Get a pro biotic at any pharmacy and see if this helps with your stomach issues.   Follow up as needed

## 2015-08-18 ENCOUNTER — Encounter: Payer: Self-pay | Admitting: *Deleted

## 2015-08-18 ENCOUNTER — Encounter: Payer: Self-pay | Admitting: Adult Health

## 2015-08-18 ENCOUNTER — Ambulatory Visit (INDEPENDENT_AMBULATORY_CARE_PROVIDER_SITE_OTHER): Payer: Medicare Other | Admitting: Adult Health

## 2015-08-18 VITALS — BP 132/68 | HR 98 | Ht 71.0 in | Wt 174.0 lb

## 2015-08-18 DIAGNOSIS — R079 Chest pain, unspecified: Secondary | ICD-10-CM | POA: Diagnosis not present

## 2015-08-18 DIAGNOSIS — I340 Nonrheumatic mitral (valve) insufficiency: Secondary | ICD-10-CM

## 2015-08-18 NOTE — Progress Notes (Deleted)
Name: Chase Anderson    DOB: 07/11/1945  Age: 70 y.o.  MR#: [id number]       PCP:  Joycelyn Man, MD      Insurance: Payor: [employer] MEDICARE / Plan: [facility] MEDICARE / Product Type: *No Product type* /   CC:    Chief Complaint  Patient presents with  . Coronary Artery Disease    VS Filed Vitals:   08/18/15 1309  BP: 132/68  Pulse: 98  Height: 5\' 11"  (1.803 m)  Weight: 174 lb (78.926 kg)  SpO2: 97%    Weights Current Weight  08/18/15 174 lb (78.926 kg)  08/08/15 174 lb (78.926 kg)  08/03/15 176 lb (79.833 kg)    Blood Pressure  BP Readings from Last 3 Encounters:  08/18/15 132/68  08/08/15 138/72  08/03/15 117/79     Admit date:  (Not on file) Last encounter with RMR:  Visit date not found   Allergy Codeine; Lisinopril; and Penicillins  Current Outpatient Prescriptions  Medication Sig Dispense Refill  . amLODipine (NORVASC) 10 MG tablet Take 1 tablet (10 mg total) by mouth daily. 100 tablet 3  . aspirin EC 81 MG tablet Take 81 mg by mouth daily.    . BD ULTRA-FINE LANCETS lancets Use once daily for glucose control 100 each 3  . buPROPion (WELLBUTRIN SR) 150 MG 12 hr tablet Take 1 tablet (150 mg total) by mouth 2 (two) times daily. 180 tablet 1  . clindamycin (CLEOCIN) 300 MG capsule Take 300 mg by mouth 3 (three) times daily.    . fluconazole (DIFLUCAN) 100 MG tablet Take 100 mg by mouth daily as needed.     . fluocinonide ointment (LIDEX) 0.05 % APPLY TOPICALLY ONCE DAILY AS NEEDED 60 g 3  . glucose blood (ACCU-CHEK AVIVA PLUS) test strip USE TO TEST BLOOD SUGAR AS DIRECTED 100 each 3  . InFLIXimab (REMICADE IV) Inject into the vein every 8 (eight) weeks.     . metFORMIN (GLUCOPHAGE) 1000 MG tablet TAKE ONE TABLET BY MOUTH DAILY WITH BREAKFAST 100 tablet 3  . metFORMIN (GLUCOPHAGE) 500 MG tablet TAKE 1 TABLET BY MOUTH PRIOR TO DINNER 100 tablet 3  . niacin 500 MG tablet Take 500 mg by mouth daily. Take one tab once daily     . nitroGLYCERIN (NITROSTAT)  0.4 MG SL tablet Place 0.4 mg under the tongue every 5 (five) minutes as needed for chest pain. Up to three time and then call the doctor    . simvastatin (ZOCOR) 20 MG tablet Take 1 tablet (20 mg total) by mouth at bedtime. 100 tablet 3  . sulfamethoxazole-trimethoprim (BACTRIM DS,SEPTRA DS) 800-160 MG tablet Take 1 tablet by mouth every Monday, Wednesday, and Friday.  5  . tadalafil (CIALIS) 20 MG tablet Take 0.5-1 tablets (10-20 mg total) by mouth every other day as needed for erectile dysfunction. 10 tablet 11  . tadalafil (CIALIS) 5 MG tablet Take 1 tablet (5 mg total) by mouth daily as needed for erectile dysfunction. 60 tablet 5  . traMADol (ULTRAM) 50 MG tablet Take 1 tablet by mouth as needed.    . triamcinolone cream (KENALOG) 0.1 % APPLY TOPICALLY AS NEEDED 45 g 3  . valACYclovir (VALTREX) 500 MG tablet Take 1 tablet (500 mg total) by mouth as needed. (Patient taking differently: Take 500 mg by mouth 3 (three) times daily. ) 60 tablet 3   No current facility-administered medications for this visit.    Discontinued Meds:   There are  no discontinued medications.  Patient Active Problem List   Diagnosis Date Noted  . Depression 03/27/2015  . Psoriatic arthritis ([place]) 01/25/2013  . Mitral valve prolapse 12/25/2010  . History of BPH 12/14/2009  . ERECTILE DYSFUNCTION 11/26/2007  . Osteoarthritis 11/26/2007  . Leukocytopenia 11/24/2006  . DERMATITIS, OTHER ATOPIC 11/24/2006  . Diabetes mellitus without complication ([place]) 0000000  . Hyperlipidemia 10/06/2006  . Essential hypertension 10/06/2006  . MYOCARDIAL INFARCTION, HX OF 10/06/2006  . Coronary atherosclerosis 10/06/2006    LABS    Component Value Date/Time   NA 134* 08/08/2015 0943   NA 136 08/03/2015 1120   NA 138 02/06/2015 0951   K 4.5 08/08/2015 0943   K 4.7 08/03/2015 1120   K 5.5* 02/06/2015 0951   CL 101 08/08/2015 0943   CL 99* 08/03/2015 1120   CL 102 02/06/2015 0951   CO2 26 08/08/2015 0943   CO2 28  02/06/2015 0951   CO2 25 02/01/2014 1039   GLUCOSE 139* 08/08/2015 0943   GLUCOSE 137* 08/03/2015 1120   GLUCOSE 124* 02/06/2015 0951   BUN 19 08/08/2015 0943   BUN 24* 08/03/2015 1120   BUN 13 02/06/2015 0951   CREATININE 1.30 08/08/2015 0943   CREATININE 1.40* 08/03/2015 1120   CREATININE 1.14 02/06/2015 0951   CALCIUM 10.1 08/08/2015 0943   CALCIUM 10.1 02/06/2015 0951   CALCIUM 10.0 02/01/2014 1039   GFRNONAA 62.81 12/07/2009 0749   GFRNONAA 79.93 04/05/2009 0736   GFRNONAA 64.81 01/03/2009 0813   GFRAA 87 11/17/2007 1131   GFRAA 88 10/03/2006 0902   CMP     Component Value Date/Time   NA 134* 08/08/2015 0943   K 4.5 08/08/2015 0943   CL 101 08/08/2015 0943   CO2 26 08/08/2015 0943   GLUCOSE 139* 08/08/2015 0943   BUN 19 08/08/2015 0943   CREATININE 1.30 08/08/2015 0943   CALCIUM 10.1 08/08/2015 0943   PROT 6.4 02/06/2015 0951   ALBUMIN 4.5 02/06/2015 0951   AST 20 02/06/2015 0951   ALT 21 02/06/2015 0951   ALKPHOS 49 02/06/2015 0951   BILITOT 0.6 02/06/2015 0951   GFRNONAA 62.81 12/07/2009 0749   GFRAA 87 11/17/2007 1131       Component Value Date/Time   WBC 6.8 08/03/2015 1045   WBC 7.3 02/06/2015 0951   WBC 6.2 02/01/2014 1039   HGB 18.0* 08/03/2015 1120   HGB 17.1* 08/03/2015 1045   HGB 16.4 02/06/2015 0951   HCT 53.0* 08/03/2015 1120   HCT 50.2 08/03/2015 1045   HCT 48.6 02/06/2015 0951   MCV 89.8 08/03/2015 1045   MCV 93.0 02/06/2015 0951   MCV 92.8 02/01/2014 1039    Lipid Panel     Component Value Date/Time   CHOL 107 02/06/2015 0951   TRIG 87.0 02/06/2015 0951   HDL 43.70 02/06/2015 0951   CHOLHDL 2 02/06/2015 0951   VLDL 17.4 02/06/2015 0951   LDLCALC 46 02/06/2015 0951    ABG    Component Value Date/Time   TCO2 23 08/03/2015 1120     Lab Results  Component Value Date   TSH 1.42 02/06/2015   BNP (last 3 results) No results for input(s): BNP in the last 8760 hours.  ProBNP (last 3 results) No results for input(s): PROBNP in  the last 8760 hours.  Cardiac Panel (last 3 results) No results for input(s): CKTOTAL, CKMB, TROPONINI, RELINDX in the last 72 hours.  Iron/TIBC/Ferritin/ %Sat No results found for: IRON, TIBC, FERRITIN, IRONPCTSAT   EKG Orders  placed or performed during the hospital encounter of 08/03/15  . EKG     Prior Assessment and Plan Problem List as of 08/18/2015      Cardiovascular and Mediastinum   Essential hypertension   MYOCARDIAL INFARCTION, HX OF   Coronary atherosclerosis   Last Assessment & Plan 01/08/2012 Office Visit Written 01/08/2012 11:50 AM by Renella Cunas, MD    Stable. Continue secondary preventative therapy.      Mitral valve prolapse   Last Assessment & Plan 01/08/2012 Office Visit Written 01/08/2012 11:50 AM by Renella Cunas, MD    Stable and asymptomatic by history and exam.        Endocrine   Diabetes mellitus without complication [facility])   Last Assessment & Plan 04/20/2010 Office Visit Written 04/20/2010 10:29 AM by Dorena Cookey, MD    Continue your current medications.  Follow-up for your annual physical exam in the fall        Musculoskeletal and Integument   DERMATITIS, OTHER ATOPIC   Osteoarthritis   Psoriatic arthritis ([place])     Other   Hyperlipidemia   Last Assessment & Plan 01/08/2012 Office Visit Written 01/08/2012 11:51 AM by Renella Cunas, MD    He is to have his blood work checked by Dr. Sherren Mocha within the next 2 weeks. His LFT should be monitored closely with him being on fluconazole, simvastatin, and amlodipine in combination.      Leukocytopenia   ERECTILE DYSFUNCTION   History of BPH   Depression       Imaging: Dg Chest 2 View  08/03/2015  CLINICAL DATA:  Chest pain and pressure beginning this morning, history coronary artery disease post MI, hypertension, diabetes mellitus, former smoker EXAM: CHEST  2 VIEW COMPARISON:  None available; prior studies from 2004 do not load for comparison FINDINGS: Normal heart size, mediastinal contours,  and pulmonary vascularity. Atherosclerotic calcification aorta. Lungs clear. No pleural effusion or pneumothorax. Bones unremarkable. IMPRESSION: No acute abnormalities. Electronically Signed   By: Lavonia Dana M.D.   On: 08/03/2015 11:49

## 2015-08-18 NOTE — Progress Notes (Signed)
Cardiology Office Note   Date:  08/18/2015   ID:  Chase, Anderson [birth year], MRN [id number]  PCP:  Joycelyn Man, MD  Cardiologist: Woodroe Chen, NP   Chief Complaint  Patient presents with  . Coronary Artery Disease      History of Present Illness: Chase Anderson is a 70 y.o. male who presents for ongoing assessment and management of coronary artery disease, history of inferior wall myocardial infarction in 2004, hypertension, diabetes, mitral valve prolapse with moderate mitral regurg, and hyperlipidemia. The patient was last seen by Dr. Bronson Ing on 09/19/2014.he was stable from a cardiovascular standpoint. The patient refused beta blocker. He is here for one year followup.  On 08/03/2015, the patient was seen in the emergency room with some left-sided chest pain. He states he had 5-6 episodes lasting 15-30 seconds described as sharp, non-radiating. No associated shortness of breath dizziness diaphoresis or nausea. This was discussed with Dr. Bronson Ing by phone from ER physician. The patient has a heart score of 5 to felt that his chest pain was atypical. Troponin was found to be negative x2. EKG was non-acute. He is here for followup.  Is being seen in ER he has had no recurrent chest pain. He does have some musculoskeletal pain with some pain in his left shoulder, with difficult range of motion. He is going to have an MRI shortly to evaluate that further. He remains active and has been without any further cardiac complaints in ER visit.  Past Medical History  Diagnosis Date  . Myocardial infarction (Chase Anderson)   . CAD (coronary artery disease)   . Hyperlipidemia   . Hypertension   . Fatigue   . Anxiety   . Diabetes mellitus     type II  . Osteoarthritis   . Erectile dysfunction   . Leukopenia   . Dermatitis     other atopic  . Allergic rhinitis     Past Surgical History  Procedure Laterality Date  . Esophagogastroduodenoscopy  04/23/2005  .  Tonsillectomy    . Knee arthroscopy      right     Current Outpatient Prescriptions  Medication Sig Dispense Refill  . amLODipine (NORVASC) 10 MG tablet Take 1 tablet (10 mg total) by mouth daily. 100 tablet 3  . aspirin EC 81 MG tablet Take 81 mg by mouth daily.    . BD ULTRA-FINE LANCETS lancets Use once daily for glucose control 100 each 3  . buPROPion (WELLBUTRIN SR) 150 MG 12 hr tablet Take 1 tablet (150 mg total) by mouth 2 (two) times daily. 180 tablet 1  . clindamycin (CLEOCIN) 300 MG capsule Take 300 mg by mouth 3 (three) times daily.    . fluconazole (DIFLUCAN) 100 MG tablet Take 100 mg by mouth daily as needed.     . fluocinonide ointment (LIDEX) 0.05 % APPLY TOPICALLY ONCE DAILY AS NEEDED 60 g 3  . glucose blood (ACCU-CHEK AVIVA PLUS) test strip USE TO TEST BLOOD SUGAR AS DIRECTED 100 each 3  . InFLIXimab (REMICADE IV) Inject into the vein every 8 (eight) weeks.     . metFORMIN (GLUCOPHAGE) 1000 MG tablet TAKE ONE TABLET BY MOUTH DAILY WITH BREAKFAST 100 tablet 3  . metFORMIN (GLUCOPHAGE) 500 MG tablet TAKE 1 TABLET BY MOUTH PRIOR TO DINNER 100 tablet 3  . niacin 500 MG tablet Take 500 mg by mouth daily. Take one tab once daily     . nitroGLYCERIN (NITROSTAT) 0.4 MG SL tablet Place 0.4  mg under the tongue every 5 (five) minutes as needed for chest pain. Up to three time and then call the doctor    . simvastatin (ZOCOR) 20 MG tablet Take 1 tablet (20 mg total) by mouth at bedtime. 100 tablet 3  . sulfamethoxazole-trimethoprim (BACTRIM DS,SEPTRA DS) 800-160 MG tablet Take 1 tablet by mouth every Monday, Wednesday, and Friday.  5  . tadalafil (CIALIS) 20 MG tablet Take 0.5-1 tablets (10-20 mg total) by mouth every other day as needed for erectile dysfunction. 10 tablet 11  . tadalafil (CIALIS) 5 MG tablet Take 1 tablet (5 mg total) by mouth daily as needed for erectile dysfunction. 60 tablet 5  . traMADol (ULTRAM) 50 MG tablet Take 1 tablet by mouth as needed.    . triamcinolone  cream (KENALOG) 0.1 % APPLY TOPICALLY AS NEEDED 45 g 3  . valACYclovir (VALTREX) 500 MG tablet Take 1 tablet (500 mg total) by mouth as needed. (Patient taking differently: Take 500 mg by mouth 3 (three) times daily. ) 60 tablet 3   No current facility-administered medications for this visit.    Allergies:   Codeine; Lisinopril; and Penicillins    Social History:  The patient  reports that he quit smoking about 13 years ago. His smoking use included Cigarettes. He quit after 2 years of use. He quit smokeless tobacco use about 13 years ago. His smokeless tobacco use included Chew. He reports that he does not drink alcohol or use illicit drugs.   Family History:  The patient's family history is not on file.    ROS: All other systems are reviewed and negative. Unless otherwise mentioned in H&P    PHYSICAL EXAM: VS:  BP 132/68 mmHg  Pulse 98  Ht 5\' 11"  (1.803 m)  Wt 174 lb (78.926 kg)  BMI 24.28 kg/m2  SpO2 97% , BMI Body mass index is 24.28 kg/(m^2). GEN: Well nourished, well developed, in no acute distress HEENT: normal Neck: no JVD, carotid bruits, or masses Cardiac: RRR; 2/6 systolic LSB murmur, rubs, or gallops,no edema  Respiratory:  clear to auscultation bilaterally, normal work of breathing GI: soft, nontender, nondistended, + BS MS: no deformity or atrophy Skin: warm and dry, no rash Neuro:  Strength and sensation are intact Psych: euthymic mood, full affect   Recent Labs: 02/06/2015: ALT 21; TSH 1.42 08/03/2015: Hemoglobin 18.0*; Platelets 697* 08/08/2015: BUN 19; Creatinine, Ser 1.30; Potassium 4.5; Sodium 134*    Lipid Panel    Component Value Date/Time   CHOL 107 02/06/2015 0951   TRIG 87.0 02/06/2015 0951   HDL 43.70 02/06/2015 0951   CHOLHDL 2 02/06/2015 0951   VLDL 17.4 02/06/2015 0951   LDLCALC 46 02/06/2015 0951      Wt Readings from Last 3 Encounters:  08/18/15 174 lb (78.926 kg)  08/08/15 174 lb (78.926 kg)  08/03/15 176 lb (79.833 kg)      ASSESSMENT AND PLAN:  1. Coronary artery disease: History of inferior MI. Recurrent discomfort. Does not appear to be cardiac in etiology described as sharp non-radiating not associated with shortness of breath diaphoresis or weakness. I will still have the patient scheduled for an exercise Cardiolite and an echocardiogram with known history  Of CAD. This will help to give [place] a baseline should he need to have further testing or surgery for his left shoulder should MRI revealed pathology.  2. Diabetes:currently on metformin. Being followed by primary care.due to see them in 2 weeks with annual labs.  3. Hypertension: blood  pressure stable. No changes in medications.    Current medicines are reviewed at length with the patient today.    Labs/ tests ordered today include: exercise Cardiolite and echocardiogram   No orders of the defined types were placed in this encounter.     Disposition:   FU with 2 weeks.  Signed, Jory Sims, NP  08/18/2015 1:30 PM    [facility] [address] [place], [place] [address] Phone: [phone]; Fax: [phone]

## 2015-08-18 NOTE — Patient Instructions (Signed)
Your physician recommends that you schedule a follow-up appointment after test.   Your physician recommends that you continue on your current medications as directed. Please refer to the Current Medication list given to you today.  Your physician has requested that you have an echocardiogram. Echocardiography is a painless test that uses sound waves to create images of your heart. It provides your doctor with information about the size and shape of your heart and how well your heart's chambers and valves are working. This procedure takes approximately one hour. There are no restrictions for this procedure.  Your physician has requested that you have en exercise stress myoview. For further information please visit [email or web address]. Please follow instruction sheet, as given.   If you need a refill on your cardiac medications before your next appointment, please call your pharmacy.  Thank you for choosing [facility]!   

## 2015-09-01 ENCOUNTER — Ambulatory Visit (HOSPITAL_COMMUNITY)
Admission: RE | Admit: 2015-09-01 | Discharge: 2015-09-01 | Disposition: A | Discharge status: Home / Self Care | Payer: Medicare Other | Source: Ambulatory Visit | Attending: Adult Health | Admitting: Adult Health

## 2015-09-01 ENCOUNTER — Encounter (HOSPITAL_COMMUNITY)
Admission: RE | Admit: 2015-09-01 | Discharge: 2015-09-01 | Disposition: A | Discharge status: Home / Self Care | Payer: Medicare Other | Source: Ambulatory Visit | Attending: Adult Health | Admitting: Adult Health

## 2015-09-01 ENCOUNTER — Encounter (HOSPITAL_COMMUNITY): Payer: Self-pay

## 2015-09-01 ENCOUNTER — Inpatient Hospital Stay (HOSPITAL_COMMUNITY): Admission: RE | Admit: 2015-09-01 | Payer: Medicare Other | Source: Ambulatory Visit

## 2015-09-01 DIAGNOSIS — E785 Hyperlipidemia, unspecified: Secondary | ICD-10-CM | POA: Insufficient documentation

## 2015-09-01 DIAGNOSIS — R079 Chest pain, unspecified: Secondary | ICD-10-CM | POA: Diagnosis not present

## 2015-09-01 DIAGNOSIS — I341 Nonrheumatic mitral (valve) prolapse: Secondary | ICD-10-CM | POA: Insufficient documentation

## 2015-09-01 DIAGNOSIS — I340 Nonrheumatic mitral (valve) insufficiency: Secondary | ICD-10-CM

## 2015-09-01 DIAGNOSIS — I351 Nonrheumatic aortic (valve) insufficiency: Secondary | ICD-10-CM | POA: Insufficient documentation

## 2015-09-01 DIAGNOSIS — I252 Old myocardial infarction: Secondary | ICD-10-CM | POA: Insufficient documentation

## 2015-09-01 DIAGNOSIS — I2510 Atherosclerotic heart disease of native coronary artery without angina pectoris: Secondary | ICD-10-CM | POA: Diagnosis not present

## 2015-09-01 DIAGNOSIS — E119 Type 2 diabetes mellitus without complications: Secondary | ICD-10-CM | POA: Diagnosis not present

## 2015-09-01 DIAGNOSIS — I510 Cardiac septal defect, acquired: Secondary | ICD-10-CM | POA: Insufficient documentation

## 2015-09-01 DIAGNOSIS — I119 Hypertensive heart disease without heart failure: Secondary | ICD-10-CM | POA: Diagnosis not present

## 2015-09-01 DIAGNOSIS — I059 Rheumatic mitral valve disease, unspecified: Secondary | ICD-10-CM | POA: Diagnosis present

## 2015-09-01 HISTORY — DX: Systemic involvement of connective tissue, unspecified: M35.9

## 2015-09-01 LAB — ECHOCARDIOGRAM COMPLETE
EERAT: 9.16
EWDT: 243 ms
FS: 28 % (ref 28–44)
IV/PV OW: 1.31
LA diam index: 2.41 cm/m2
LA vol index: 27.5 mL/m2
LASIZE: 48 mm
LAVOL: 54.7 mL
LAVOLA4C: 48.1 mL
LEFT ATRIUM END SYS DIAM: 48 mm
LV E/e' medial: 9.16
LV PW d: 9.08 mm — AB (ref 0.6–1.1)
LV TDI E'LATERAL: 8.05
LV TDI E'MEDIAL: 5.87
LV e' LATERAL: 8.05 cm/s
LVDIAVOL: 92 mL (ref 62–150)
LVDIAVOLIN: 46 mL/m2
LVEEAVG: 9.16
LVOT area: 3.8 cm2
LVOTD: 22 mm
LVSYSVOL: 43 mL (ref 21–61)
LVSYSVOLIN: 22 mL/m2
MV Dec: 243
MV Peak grad: 2 mmHg
MV pk E vel: 73.7 m/s
MVPKAVEL: 92.9 m/s
RV TAPSE: 24 mm
Reg peak vel: 225 cm/s
Simpson's disk: 53
Stroke v: 49 ml
TRMAXVEL: 225 cm/s
VTI: 145 cm

## 2015-09-01 LAB — NM MYOCAR MULTI W/SPECT W/WALL MOTION / EF
CHL CUP MPHR: 150 {beats}/min
CHL CUP NUCLEAR SDS: 1
CHL CUP RESTING HR STRESS: 80 {beats}/min
CHL RATE OF PERCEIVED EXERTION: 12
CSEPED: 5 min
CSEPEDS: 20 s
CSEPEW: 7 METS
LV sys vol: 45 mL
LVDIAVOL: 113 mL (ref 62–150)
NUC STRESS TID: 1.13
Peak HR: 136 {beats}/min
Percent HR: 90 %
RATE: 0.21
SRS: 6
SSS: 6

## 2015-09-01 MED ORDER — SODIUM CHLORIDE 0.9% FLUSH
INTRAVENOUS | Status: AC
  Administered 2015-09-01: 10 mL via INTRAVENOUS
  Filled 2015-09-01: qty 10

## 2015-09-01 MED ORDER — TECHNETIUM TC 99M TETROFOSMIN IV KIT
10.0000 | PACK | Freq: Once | INTRAVENOUS | Status: AC | PRN
  Administered 2015-09-01: 9.9 via INTRAVENOUS

## 2015-09-01 MED ORDER — TECHNETIUM TC 99M TETROFOSMIN IV KIT
30.0000 | PACK | Freq: Once | INTRAVENOUS | Status: AC | PRN
  Administered 2015-09-01: 29.9 via INTRAVENOUS

## 2015-09-01 MED ORDER — REGADENOSON 0.4 MG/5ML IV SOLN
INTRAVENOUS | Status: AC
  Filled 2015-09-01: qty 5

## 2015-09-08 ENCOUNTER — Ambulatory Visit: Payer: Medicare Other | Admitting: Adult Health

## 2015-09-10 ENCOUNTER — Other Ambulatory Visit: Payer: Self-pay | Admitting: Family Medicine

## 2015-09-14 ENCOUNTER — Other Ambulatory Visit: Payer: Self-pay | Admitting: *Deleted

## 2015-09-15 ENCOUNTER — Encounter: Payer: Self-pay | Admitting: Family Medicine

## 2015-09-15 ENCOUNTER — Other Ambulatory Visit: Payer: Self-pay

## 2015-09-15 ENCOUNTER — Ambulatory Visit (INDEPENDENT_AMBULATORY_CARE_PROVIDER_SITE_OTHER): Payer: Medicare Other | Admitting: Family Medicine

## 2015-09-15 VITALS — BP 136/78 | HR 92 | Temp 98.2°F | Ht 71.25 in | Wt 176.0 lb

## 2015-09-15 DIAGNOSIS — I10 Essential (primary) hypertension: Secondary | ICD-10-CM | POA: Diagnosis not present

## 2015-09-15 DIAGNOSIS — E7800 Pure hypercholesterolemia, unspecified: Secondary | ICD-10-CM

## 2015-09-15 DIAGNOSIS — F32A Depression, unspecified: Secondary | ICD-10-CM

## 2015-09-15 DIAGNOSIS — E119 Type 2 diabetes mellitus without complications: Secondary | ICD-10-CM

## 2015-09-15 DIAGNOSIS — E785 Hyperlipidemia, unspecified: Secondary | ICD-10-CM | POA: Diagnosis not present

## 2015-09-15 DIAGNOSIS — N183 Chronic kidney disease, stage 3 unspecified: Secondary | ICD-10-CM | POA: Insufficient documentation

## 2015-09-15 DIAGNOSIS — F528 Other sexual dysfunction not due to a substance or known physiological condition: Secondary | ICD-10-CM | POA: Diagnosis not present

## 2015-09-15 DIAGNOSIS — E139 Other specified diabetes mellitus without complications: Secondary | ICD-10-CM

## 2015-09-15 DIAGNOSIS — F329 Major depressive disorder, single episode, unspecified: Secondary | ICD-10-CM

## 2015-09-15 LAB — POCT GLYCOSYLATED HEMOGLOBIN (HGB A1C): HEMOGLOBIN A1C: 6.2

## 2015-09-15 MED ORDER — SIMVASTATIN 20 MG PO TABS: 20.0000 mg | ORAL_TABLET | Freq: Every day | ORAL | Status: DC

## 2015-09-15 MED ORDER — METFORMIN HCL 500 MG PO TABS: ORAL_TABLET | ORAL | Status: DC

## 2015-09-15 MED ORDER — BUPROPION HCL ER (SR) 150 MG PO TB12: 150.0000 mg | ORAL_TABLET | Freq: Two times a day (BID) | ORAL | Status: DC

## 2015-09-15 MED ORDER — GLUCOSE BLOOD VI STRP: ORAL_STRIP | Status: DC

## 2015-09-15 MED ORDER — TADALAFIL 20 MG PO TABS: 10.0000 mg | ORAL_TABLET | ORAL | Status: DC | PRN

## 2015-09-15 MED ORDER — AMLODIPINE BESYLATE 10 MG PO TABS: 10.0000 mg | ORAL_TABLET | Freq: Every day | ORAL | Status: DC

## 2015-09-15 MED ORDER — METFORMIN HCL 1000 MG PO TABS: ORAL_TABLET | ORAL | Status: DC

## 2015-09-15 NOTE — Progress Notes (Signed)
Pre visit review using our clinic review tool, if applicable. No additional management support is needed unless otherwise documented below in the visit note. 

## 2015-09-15 NOTE — Assessment & Plan Note (Signed)
S: well controlled on Simvastatin  20mg , also niacin. No myalgias.  Lab Results  Component Value Date   CHOL 107 02/06/2015   HDL 43.70 02/06/2015   LDLCALC 46 02/06/2015   TRIG 87.0 02/06/2015   CHOLHDL 2 02/06/2015   A/P: continue current medicaitons- doing well. LDL <70

## 2015-09-15 NOTE — Assessment & Plan Note (Signed)
S: controlled on amlodipine 10mg  BP Readings from Last 3 Encounters:  09/15/15 136/78  08/18/15 132/68  08/08/15 138/72  A/P:Continue current meds:  Doing well

## 2015-09-15 NOTE — Assessment & Plan Note (Signed)
S: well controlled. On Metformin 1000mg  at night, 500mg  in evening with a1c 6.2  CBGs- low 100s Lab Results  Component Value Date   HGBA1C 6.2 09/15/2015   HGBA1C 6.0 02/06/2015   HGBA1C 6.5 02/01/2014   A/P: 4-6 months stomach bothers him after meals. Probiotic- has not tried advised by Cor[facility]. Tums not help at all- wonders if metformin contributing. I suspect GERD but we will trial once a day metformin 1000mg  in AM as suspect will still have a1c less than 7- if no improvement- trial PPI at CPE

## 2015-09-15 NOTE — Progress Notes (Signed)
Phone: [phone]  Subjective:  Patient presents today to establish care with me as their new primary care provider. Patient was formerly a patient of Dr. Sherren Mocha. Chief complaint-noted.   See problem oriented charting- ROS- admits to fatigue with activity but no worsening and recent stress test low risk. No chest pain or shortness of breath other than episode of CP leading to stress test. No nausea/vomiting  The following were reviewed and entered/updated in epic: Past Medical History  Diagnosis Date  . Myocardial infarction ([place])   . CAD (coronary artery disease)   . Hyperlipidemia   . Hypertension   . Fatigue   . Anxiety   . Diabetes mellitus     type II  . Osteoarthritis   . Erectile dysfunction   . Leukopenia   . Dermatitis     other atopic  . Allergic rhinitis   . Collagen vascular disease [facility])    Patient Active Problem List   Diagnosis Date Noted  . Psoriatic arthritis ([place]) 01/25/2013    Priority: High  . Mitral valve prolapse 12/25/2010    Priority: High  . Diabetes mellitus without complication ([place]) 0000000    Priority: High  . CAD (coronary artery disease) with history MI 2004- no intervention 10/06/2006    Priority: High  . CKD (chronic kidney disease), stage III 09/15/2015    Priority: Medium  . Depression 03/27/2015    Priority: Medium  . History of BPH 12/14/2009    Priority: Medium  . Hyperlipidemia 10/06/2006    Priority: Medium  . Essential hypertension 10/06/2006    Priority: Medium  . ERECTILE DYSFUNCTION 11/26/2007    Priority: Low  . Osteoarthritis 11/26/2007    Priority: Low  . Leukocytopenia 11/24/2006  . DERMATITIS, OTHER ATOPIC 11/24/2006   Past Surgical History  Procedure Laterality Date  . Esophagogastroduodenoscopy  04/23/2005  . Tonsillectomy    . Knee arthroscopy      right    No family history on file.  Medications- reviewed and updated Current Outpatient Prescriptions  Medication Sig Dispense Refill  . amLODipine  (NORVASC) 10 MG tablet Take 1 tablet (10 mg total) by mouth daily. 100 tablet 3  . aspirin EC 81 MG tablet Take 81 mg by mouth daily.    . BD ULTRA-FINE LANCETS lancets Use once daily for glucose control 100 each 3  . buPROPion (WELLBUTRIN SR) 150 MG 12 hr tablet Take 1 tablet (150 mg total) by mouth 2 (two) times daily. 180 tablet 1  . clindamycin (CLEOCIN) 300 MG capsule Take 300 mg by mouth 3 (three) times daily.    . fluconazole (DIFLUCAN) 100 MG tablet Take 100 mg by mouth daily as needed.     . fluocinonide ointment (LIDEX) 0.05 % APPLY TOPICALLY ONCE DAILY AS NEEDED 60 g 3  . glucose blood (ACCU-CHEK AVIVA PLUS) test strip USE TO TEST BLOOD SUGAR AS DIRECTED 100 each 3  . InFLIXimab (REMICADE IV) Inject into the vein every 8 (eight) weeks.     . niacin 500 MG tablet Take 500 mg by mouth daily. Take one tab once daily     . nitroGLYCERIN (NITROSTAT) 0.4 MG SL tablet Place 0.4 mg under the tongue every 5 (five) minutes as needed for chest pain. Up to three time and then call the doctor    . sulfamethoxazole-trimethoprim (BACTRIM DS,SEPTRA DS) 800-160 MG tablet Take 1 tablet by mouth every Monday, Wednesday, and Friday.  5  . tadalafil (CIALIS) 20 MG tablet Take 0.5-1 tablets (10-20  mg total) by mouth every 3 (three) days as needed for erectile dysfunction. 10 tablet 11  . traMADol (ULTRAM) 50 MG tablet Take 1 tablet by mouth as needed.    . triamcinolone cream (KENALOG) 0.1 % APPLY TOPICALLY AS NEEDED 45 g 3  . valACYclovir (VALTREX) 500 MG tablet Take 1 tablet (500 mg total) by mouth as needed. (Patient taking differently: Take 500 mg by mouth 3 (three) times daily. ) 60 tablet 3  . metFORMIN (GLUCOPHAGE) 1000 MG tablet TAKE ONE TABLET BY MOUTH DAILY WITH BREAKFAST 100 tablet 3  . metFORMIN (GLUCOPHAGE) 500 MG tablet TAKE 1 TABLET BY MOUTH PRIOR TO DINNER 100 tablet 3  . simvastatin (ZOCOR) 20 MG tablet Take 1 tablet (20 mg total) by mouth at bedtime. 100 tablet 3   No current  facility-administered medications for this visit.    Allergies-reviewed and updated Allergies  Allergen Reactions  . Codeine     REACTION: itching and rash  . Lisinopril   . Penicillins     REACTION: as a child    Social History   Social History  . Marital Status: Married    Spouse Name: N/A  . Number of Children: N/A  . Years of Education: N/A   Social History Main Topics  . Smoking status: Former Smoker -- 2 years    Types: Cigarettes    Quit date: 12/09/2001  . Smokeless tobacco: Former Systems developer    Types: Chew    Quit date: 03/11/2002  . Alcohol Use: No  . Drug Use: No  . Sexual Activity: Not Asked   Other Topics Concern  . None   Social History Narrative   Married. 2 children. 2 stepchildren. 7 grandkids including step   Take care of daughter of law Posie Jefcoat- son left her with 28 year old.    need to go through most history about social      35 years [place] [employer]      Hobbies: golfer, gardening    ROS--See HPI   Objective: BP 136/78 mmHg  Pulse 92  Temp(Src) 98.2 F (36.8 C) (Oral)  Ht 5' 11.25" (1.81 m)  Wt 176 lb (79.833 kg)  BMI 24.37 kg/m2  SpO2 97% Gen: NAD, resting comfortably CV: RRR no rubs or gallops. 3/6 murmur systolic Lungs: CTAB no crackles, wheeze, rhonchi Abdomen: soft/nontender/nondistended/normal bowel sounds. No rebound or guarding.  Ext: no edema Skin: warm, dry, no rash Neuro: grossly normal, moves all extremities, PERRLA  Assessment/Plan:  Essential hypertension S: controlled on amlodipine 10mg  BP Readings from Last 3 Encounters:  09/15/15 136/78  08/18/15 132/68  08/08/15 138/72  A/P:Continue current meds:  Doing well   Depression S: improved control now on wellbutrin 150mg  SR BID. PHQ2 of 1, phq9 of 6- but much of the fatigue and sleepiness has been post MI and unchanged A/P: continue current medication. N/SI/HI  Hyperlipidemia S: well controlled on Simvastatin  20mg , also niacin. No myalgias.  Lab Results    Component Value Date   CHOL 107 02/06/2015   HDL 43.70 02/06/2015   LDLCALC 46 02/06/2015   TRIG 87.0 02/06/2015   CHOLHDL 2 02/06/2015   A/P: continue current medicaitons- doing well. LDL <70    Diabetes mellitus without complication ([place]) S: well controlled. On Metformin 1000mg  at night, 500mg  in evening with a1c 6.2  CBGs- low 100s Lab Results  Component Value Date   HGBA1C 6.2 09/15/2015   HGBA1C 6.0 02/06/2015   HGBA1C 6.5 02/01/2014   A/P: 4-6  months stomach bothers him after meals. Probiotic- has not tried advised by [facility]. Tums not help at all- wonders if metformin contributing. I suspect GERD but we will trial once a day metformin 1000mg  in AM as suspect will still have a1c less than 7- if no improvement- trial PPI at CPE    Return in about 5 months (around 02/07/2016) for physical.  Orders Placed This Encounter  Procedures  . POCT glycosylated hemoglobin (Hb A1C)    Meds ordered this encounter  Medications  . tadalafil (CIALIS) 20 MG tablet    Sig: Take 0.5-1 tablets (10-20 mg total) by mouth every 3 (three) days as needed for erectile dysfunction.    Dispense:  10 tablet    Refill:  11  . amLODipine (NORVASC) 10 MG tablet    Sig: Take 1 tablet (10 mg total) by mouth daily.    Dispense:  100 tablet    Refill:  3  . buPROPion (WELLBUTRIN SR) 150 MG 12 hr tablet    Sig: Take 1 tablet (150 mg total) by mouth 2 (two) times daily.    Dispense:  180 tablet    Refill:  3    Return precautions advised.   Garret Reddish, MD

## 2015-09-15 NOTE — Patient Instructions (Addendum)
Stop metformin in the evening, keep taking full tab in the morning to see if that helps you with stomach upset    Serotonin Syndrome (risks with wellbutrin and tramadol- but I think risk is low if taking once a day maximum) Serotonin is a brain chemical that regulates the nervous system, which includes the brain, spinal cord, and nerves. Serotonin appears to play a role in all types of behavior, including appetite, emotions, movement, thinking, and response to stress. Excessively high levels of serotonin in the body can cause serotonin syndrome, which is a very dangerous condition. CAUSES This condition can be caused by taking medicines or drugs that increase the level of serotonin in your body. These include:  Antidepressant medicines.  Migraine medicines.  Certain pain medicines.  Certain recreational drugs, including ecstasy, LSD, cocaine, and amphetamines.  Over-the-counter cough or cold medicines that contain dextromethorphan.  Certain herbal supplements, including St. John's wort, ginseng, and nutmeg. This condition usually occurs when you take these medicines or drugs in combination, but it can also happen with a high dose of a single medicine or drug. RISK FACTORS This condition is more likely to develop in:  People who have recently increased the dosage of medicine that increases the serotonin level.  People who just started taking medicine that increases the serotonin level. SYMPTOMS Symptoms of this condition usually happens within several hours of a medicine change. Symptoms include:  Headache.  Muscle twitching or stiffness.  Diarrhea.  Confusion.  Restlessness or agitation.  Shivering or goose bumps.  Loss of muscle coordination.  Rapid heart rate.  Sweating. Severe cases of serotonin syndromecan cause:  Irregular heartbeat.  Seizures.  Loss of consciousness.  High fever. DIAGNOSIS This condition is diagnosed with a medical history and physical  exam. You will be asked aboutyour symptoms and your use of medicines and recreational drugs. Your health care provider may also order lab work or additional tests to rule out other causes of your symptoms. TREATMENT The treatment for this condition depends on the severity of your symptoms. For mild cases, stopping the medicine that caused your condition is usually all that is needed. For moderate to severe cases, hospitalization is required to monitor you and to prevent further muscle damage. HOME CARE INSTRUCTIONS  Take over-the-counter and prescription medicines only as told by your health care provider. This is important.  Check with your health care provider before you start taking any new prescriptions, over-the-counter medicines, herbs, or supplements.  Avoid combining any medicines that can cause this condition to occur.  Keep all follow-up visits as told by your health care provider.This is important.  Maintain a healthy lifestyle.  Eat healthy foods.  Get plenty of sleep.  Exercise regularly.  Do not drink alcohol.  Do not use recreational drugs. SEEK MEDICAL CARE IF:  Medicines do not seem to be helping.  Your symptoms do not improve or they get worse.  You have trouble taking care of yourself. SEEK IMMEDIATE MEDICAL CARE IF:  You have worsening confusion, severe headache, chest pain, high fever, seizures, or loss of consciousness.  You have serious thoughts about hurting yourself or others.  You experience serious side effects of medicine, such as swelling of your face, lips, tongue, or throat.   This information is not intended to replace advice given to you by your health care provider. Make sure you discuss any questions you have with your health care provider.   Document Released: 04/04/2004 Document Revised: 07/12/2014 Document Reviewed: 03/10/2014 Elsevier Interactive  Patient Education 2016 [employer].

## 2015-09-15 NOTE — Assessment & Plan Note (Signed)
S: improved control now on wellbutrin 150mg  SR BID. PHQ2 of 1, phq9 of 6- but much of the fatigue and sleepiness has been post MI and unchanged A/P: continue current medication. N/SI/HI

## 2015-09-25 ENCOUNTER — Encounter (HOSPITAL_COMMUNITY): Payer: Medicare Other

## 2015-09-25 ENCOUNTER — Ambulatory Visit (HOSPITAL_COMMUNITY): Payer: Medicare Other

## 2016-02-07 ENCOUNTER — Encounter: Payer: Self-pay | Admitting: Family Medicine

## 2016-02-07 ENCOUNTER — Ambulatory Visit (INDEPENDENT_AMBULATORY_CARE_PROVIDER_SITE_OTHER): Payer: Medicare Other | Admitting: Family Medicine

## 2016-02-07 ENCOUNTER — Other Ambulatory Visit: Payer: Self-pay | Admitting: Family Medicine

## 2016-02-07 VITALS — BP 110/70 | HR 97 | Temp 97.8°F | Ht 71.25 in | Wt 181.6 lb

## 2016-02-07 DIAGNOSIS — E139 Other specified diabetes mellitus without complications: Secondary | ICD-10-CM

## 2016-02-07 DIAGNOSIS — Z125 Encounter for screening for malignant neoplasm of prostate: Secondary | ICD-10-CM

## 2016-02-07 DIAGNOSIS — L301 Dyshidrosis [pompholyx]: Secondary | ICD-10-CM

## 2016-02-07 DIAGNOSIS — Z0001 Encounter for general adult medical examination with abnormal findings: Secondary | ICD-10-CM

## 2016-02-07 DIAGNOSIS — Z23 Encounter for immunization: Secondary | ICD-10-CM

## 2016-02-07 DIAGNOSIS — Z7289 Other problems related to lifestyle: Secondary | ICD-10-CM

## 2016-02-07 DIAGNOSIS — E7800 Pure hypercholesterolemia, unspecified: Secondary | ICD-10-CM | POA: Diagnosis not present

## 2016-02-07 DIAGNOSIS — I10 Essential (primary) hypertension: Secondary | ICD-10-CM

## 2016-02-07 DIAGNOSIS — F331 Major depressive disorder, recurrent, moderate: Secondary | ICD-10-CM

## 2016-02-07 DIAGNOSIS — L4050 Arthropathic psoriasis, unspecified: Secondary | ICD-10-CM | POA: Diagnosis not present

## 2016-02-07 DIAGNOSIS — E119 Type 2 diabetes mellitus without complications: Secondary | ICD-10-CM | POA: Diagnosis not present

## 2016-02-07 LAB — COMPREHENSIVE METABOLIC PANEL
ALK PHOS: 48 U/L (ref 39–117)
ALT: 23 U/L (ref 0–53)
AST: 20 U/L (ref 0–37)
Albumin: 4.6 g/dL (ref 3.5–5.2)
BILIRUBIN TOTAL: 0.8 mg/dL (ref 0.2–1.2)
BUN: 19 mg/dL (ref 6–23)
CALCIUM: 9.7 mg/dL (ref 8.4–10.5)
CO2: 27 mEq/L (ref 19–32)
Chloride: 102 mEq/L (ref 96–112)
Creatinine, Ser: 1.33 mg/dL (ref 0.40–1.50)
GFR: 56.34 mL/min — AB (ref >60.00)
Glucose, Bld: 136 mg/dL — ABNORMAL HIGH (ref 70–99)
POTASSIUM: 4.9 meq/L (ref 3.5–5.1)
Sodium: 137 mEq/L (ref 135–145)
TOTAL PROTEIN: 6.4 g/dL (ref 6.0–8.3)

## 2016-02-07 LAB — CBC WITH DIFFERENTIAL/PLATELET
BASOS ABS: 0 10*3/uL (ref 0.0–0.1)
Basophils Relative: 0.6 % (ref 0.0–3.0)
EOS PCT: 3.4 % (ref 0.0–5.0)
Eosinophils Absolute: 0.2 10*3/uL (ref 0.0–0.7)
HEMATOCRIT: 45.3 % (ref 39.0–52.0)
Hemoglobin: 15.8 g/dL (ref 13.0–17.0)
LYMPHS ABS: 0.8 10*3/uL (ref 0.7–4.0)
LYMPHS PCT: 11.5 % — AB (ref 12.0–46.0)
MCHC: 34.9 g/dL (ref 30.0–36.0)
MCV: 90.3 fl (ref 78.0–100.0)
MONOS PCT: 7.1 % (ref 3.0–12.0)
Monocytes Absolute: 0.5 10*3/uL (ref 0.1–1.0)
NEUTROS ABS: 5.4 10*3/uL (ref 1.4–7.7)
NEUTROS PCT: 77.4 % — AB (ref 43.0–77.0)
PLATELETS: 695 10*3/uL — AB (ref 150.0–400.0)
RBC: 5.02 Mil/uL (ref 4.22–5.81)
RDW: 13.3 % (ref 11.5–15.5)
WBC: 7 10*3/uL (ref 4.0–10.5)

## 2016-02-07 LAB — LIPID PANEL
CHOL/HDL RATIO: 3
Cholesterol: 108 mg/dL (ref 0–200)
HDL: 39.9 mg/dL (ref >39.00)
LDL Cholesterol: 50 mg/dL (ref 0–99)
NonHDL: 67.79
TRIGLYCERIDES: 90 mg/dL (ref 0.0–149.0)
VLDL: 18 mg/dL (ref 0.0–40.0)

## 2016-02-07 LAB — MICROALBUMIN / CREATININE URINE RATIO
CREATININE, U: 210.7 mg/dL
MICROALB UR: 9.3 mg/dL — AB (ref 0.0–1.9)
Microalb Creat Ratio: 4.4 mg/g (ref 0.0–30.0)

## 2016-02-07 LAB — HEMOGLOBIN A1C: HEMOGLOBIN A1C: 6.3 % (ref 4.6–6.5)

## 2016-02-07 LAB — PSA: PSA: 0.67 ng/mL (ref 0.10–4.00)

## 2016-02-07 MED ORDER — AMLODIPINE BESYLATE 10 MG PO TABS: 10.0000 mg | ORAL_TABLET | Freq: Every day | ORAL | 3 refills | Status: DC

## 2016-02-07 MED ORDER — GLUCOSE BLOOD VI STRP: ORAL_STRIP | 3 refills | Status: DC

## 2016-02-07 MED ORDER — TRAMADOL HCL 50 MG PO TABS: 50.0000 mg | ORAL_TABLET | Freq: Two times a day (BID) | ORAL | 5 refills | Status: DC | PRN

## 2016-02-07 MED ORDER — SIMVASTATIN 20 MG PO TABS: 20.0000 mg | ORAL_TABLET | Freq: Every day | ORAL | 3 refills | Status: DC

## 2016-02-07 MED ORDER — METFORMIN HCL 1000 MG PO TABS: ORAL_TABLET | ORAL | 3 refills | Status: DC

## 2016-02-07 MED ORDER — FLUOCINONIDE 0.05 % EX OINT: TOPICAL_OINTMENT | CUTANEOUS | 3 refills | Status: DC

## 2016-02-07 NOTE — Patient Instructions (Addendum)
Sign release of information at the check out desk for eye exam  Thanks for getting flu shot  Labs before you leave  Refilled lidex for eczema on hands  For depression- add counseling. See me back in 6 weeks and if not improving- may have to add medication but hoepful we can avoid with counseling  I will start filling tramadol

## 2016-02-07 NOTE — Assessment & Plan Note (Signed)
controlled 1000mg  in am and metformin 500mg  in evening hopefully-rtook off 500 last visit but restarted about 2 months later. CBGs around 130 despite starting back. - had been even higher without. Rx for 1000mg  BID- get a1c and decide on 1000, 500 or 1000 BID.  Lab Results  Component Value Date   HGBA1C 6.2 09/15/2015

## 2016-02-07 NOTE — Progress Notes (Signed)
Pre visit review using our clinic review tool, if applicable. No additional management support is needed unless otherwise documented below in the visit note. 

## 2016-02-07 NOTE — Addendum Note (Signed)
Addended by: Lyndle Herrlich on: 02/07/2016 09:42 AM   Modules accepted: Orders

## 2016-02-07 NOTE — Assessment & Plan Note (Signed)
S:Psoriatic arthritis- on remicade through [facility]. Also sees ID. Patient states his pain is controlled with combo of remicade and tramadol. He was told by rheumatology they would prefer primary care to prescribe. A/P: Discussed would minimize use- max uses twice a day. I am agreeable to prescribe this but discussed tolerance/addiction potential. Extended counseling provided on non preventative issue and new rx provided not previously given here.

## 2016-02-07 NOTE — Assessment & Plan Note (Signed)
S: was doing well on wellbutrin 150mg  SR BID. Over last few months this has become uncontrolled. PHQ9 from 1 now to 12 but no SI. Compliant with wellbutrin. Also wants tramadol for pain control for psoriatic arthritis A/P: discussed SSRI option but instead of adding more medicine and increasing serotonin syndrome risk would prefer to try treatment with CBT first- strongly advised to call [facility] which he reluctantly agrees to. See me after 6 weeks of sessions to reassess- if not improving may have to try a low dose SSRI perhaps zoloft 50mg . Extended counseling.

## 2016-02-07 NOTE — Assessment & Plan Note (Signed)
S: dry cracking in fingertips. Has been on lidex in past but not previously documented in problem list under this. He will put on and wear gloves for 24 hours and improves when flared up A/P: refilled lidex- ok to use ashe has been doing.

## 2016-02-07 NOTE — Progress Notes (Signed)
Phone: [phone]  Subjective:  Patient presents today for their annual physical. Chief complaint-noted.   See problem oriented charting- ROS- full  review of systems was completed and negative except for: depressed mood. Fortunately no SI. Some anhedonia.   The following were reviewed and entered/updated in epic: Past Medical History:  Diagnosis Date  . Allergic rhinitis   . Anxiety   . CAD (coronary artery disease)   . Collagen vascular disease ([place])   . Dermatitis    other atopic  . Diabetes mellitus    type II  . Erectile dysfunction   . Fatigue   . Hyperlipidemia   . Hypertension   . Leukopenia   . Myocardial infarction   . Osteoarthritis    Patient Active Problem List   Diagnosis Date Noted  . Psoriatic arthritis ([place]) 01/25/2013    Priority: High  . Mitral valve prolapse 12/25/2010    Priority: High  . Diabetes mellitus without complication ([place]) 0000000    Priority: High  . CAD (coronary artery disease) with history MI 2004- no intervention 10/06/2006    Priority: High  . Dyshidrotic eczema 02/07/2016    Priority: Medium  . CKD (chronic kidney disease), stage III 09/15/2015    Priority: Medium  . Depression 03/27/2015    Priority: Medium  . History of BPH 12/14/2009    Priority: Medium  . Hyperlipidemia 10/06/2006    Priority: Medium  . Essential hypertension 10/06/2006    Priority: Medium  . ERECTILE DYSFUNCTION 11/26/2007    Priority: Low  . Leukocytopenia 11/24/2006  . DERMATITIS, OTHER ATOPIC 11/24/2006   Past Surgical History:  Procedure Laterality Date  . ESOPHAGOGASTRODUODENOSCOPY  04/23/2005  . KNEE ARTHROSCOPY     right  . TONSILLECTOMY      No family history on file.  Medications- reviewed and updated Current Outpatient Prescriptions  Medication Sig Dispense Refill  . amLODipine (NORVASC) 10 MG tablet Take 1 tablet (10 mg total) by mouth daily. 100 tablet 3  . aspirin EC 81 MG tablet Take 81 mg by mouth daily.    . BD  ULTRA-FINE LANCETS lancets Use once daily for glucose control 100 each 3  . buPROPion (WELLBUTRIN SR) 150 MG 12 hr tablet Take 1 tablet (150 mg total) by mouth 2 (two) times daily. 180 tablet 3  . clindamycin (CLEOCIN) 300 MG capsule Take 300 mg by mouth 3 (three) times daily.    . fluconazole (DIFLUCAN) 100 MG tablet Take 100 mg by mouth daily as needed.     . fluocinonide ointment (LIDEX) 0.05 % APPLY TOPICALLY ONCE DAILY AS NEEDED 60 g 3  . glucose blood (ACCU-CHEK AVIVA PLUS) test strip USE TO TEST BLOOD SUGAR AS DIRECTED 100 each 3  . InFLIXimab (REMICADE IV) Inject into the vein every 8 (eight) weeks.     . metFORMIN (GLUCOPHAGE) 1000 MG tablet TAKE ONE TABLET BY MOUTH TWICE A DAY 200 tablet 3  . niacin 500 MG tablet Take 500 mg by mouth daily. Take one tab once daily     . nitroGLYCERIN (NITROSTAT) 0.4 MG SL tablet Place 0.4 mg under the tongue every 5 (five) minutes as needed for chest pain. Up to three time and then call the doctor    . simvastatin (ZOCOR) 20 MG tablet Take 1 tablet (20 mg total) by mouth at bedtime. 100 tablet 3  . sulfamethoxazole-trimethoprim (BACTRIM DS,SEPTRA DS) 800-160 MG tablet Take 1 tablet by mouth every Monday, Wednesday, and Friday.  5  . tadalafil (CIALIS)  20 MG tablet Take 0.5-1 tablets (10-20 mg total) by mouth every 3 (three) days as needed for erectile dysfunction. 10 tablet 11  . traMADol (ULTRAM) 50 MG tablet Take 1 tablet (50 mg total) by mouth 2 (two) times daily as needed. 60 tablet 5  . valACYclovir (VALTREX) 500 MG tablet Take 1 tablet (500 mg total) by mouth as needed. (Patient taking differently: Take 500 mg by mouth 3 (three) times daily. ) 60 tablet 3   No current facility-administered medications for this visit.     Allergies-reviewed and updated Allergies  Allergen Reactions  . Codeine     REACTION: itching and rash  . Lisinopril   . Penicillins     REACTION: as a child    Social History   Social History  . Marital status:  Married    Spouse name: N/A  . Number of children: N/A  . Years of education: N/A   Social History Main Topics  . Smoking status: Former Smoker    Years: 2.00    Types: Cigarettes    Quit date: 12/09/2001  . Smokeless tobacco: Former Systems developer    Types: Chew    Quit date: 03/11/2002  . Alcohol use No  . Drug use: No  . Sexual activity: Not Asked   Other Topics Concern  . None   Social History Narrative   Married. 2 children. 2 stepchildren. 7 grandkids including step   Take care of daughter of law Leigh Anastacio- son left her with 52 year old.    need to go through most history about social      35 years [place] [employer]      Hobbies: golfer, gardening    Objective: BP 110/70 (BP Location: Left Arm, Patient Position: Sitting, Cuff Size: Large)   Pulse 97   Temp 97.8 F (36.6 C) (Oral)   Ht 5' 11.25" (1.81 m)   Wt 181 lb 9.6 oz (82.4 kg)   SpO2 96%   BMI 25.15 kg/m  Gen: NAD, resting comfortably HEENT: Mucous membranes are moist. Oropharynx normal CV: RRR no rubs or gallops Lungs: CTAB no crackles, wheeze, rhonchi Abdomen: soft/nontender/nondistended/normal bowel sounds. No rebound or guarding. overweight Ext: no edema Skin: warm, dry Neuro: grossly normal, moves all extremities, PERRLA  Diabetic Foot Exam - Simple   Simple Foot Form Visual Inspection See comments:  Yes Sensation Testing Intact to touch and monofilament testing bilaterally:  Yes Pulse Check Posterior Tibialis and Dorsalis pulse intact bilaterally:  Yes Comments Thickened skin on soles of both feet. No ulcers    Assessment/Plan:  70 y.o. male presenting for annual physical.  Health Maintenance counseling: 1. Anticipatory guidance: Patient counseled regarding regular dental exams, eye exams, wearing seatbelts.  2. Risk factor reduction:  Advised patient of need for regular exercise (walking 2 miles daily most days) and diet rich and fruits and vegetables to reduce risk of heart attack and stroke. Up a few  lbs but just through thanksgiving 3. Immunizations/screenings/ancillary studies Immunization History  Administered Date(s) Administered  . Influenza Split 01/23/2012, 11/19/2012  . Influenza Whole 11/26/2007, 12/14/2009  . Influenza, High Dose Seasonal PF 02/01/2014, 02/06/2015  . Influenza-Unspecified 01/05/2009  . Pneumococcal Conjugate-13 06/02/2013  . Pneumococcal Polysaccharide-23 03/11/2004, 06/29/2009  . Td 03/11/2002  . Tetanus 03/10/2013   Health Maintenance Due  Topic Date Due  . Hepatitis C Screening - screen today [birth year]  . OPHTHALMOLOGY EXAM - get records 12/04/2013  . PNA vac Low Risk Adult (2 of 2 - PPSV23)-  declines as got last shot just before 64 06/30/2014  . INFLUENZA VACCINE - today 10/10/2015  . FOOT EXAM - today as above 02/06/2016  . URINE MICROALBUMIN - today 02/06/2016   4. Prostate cancer screening- low risk on prior testing, he opts in today. BPH on exam 2-3x a night nocturia. Get PSA  Lab Results  Component Value Date   PSA 0.54 02/06/2015   PSA 0.56 02/01/2014   PSA 0.69 01/25/2013   5. Colon cancer screening - 2010 with 10 year repeat after review of letter from GI  Status of chronic or acute concerns  CAD- follows with cardiology- on aspirin, statin. Nitro on hand. Walks 2 miles a day. Last LDL <70- needs updating. Simvastatin 20mg .   MV prolapse- mild bileaflet prolapse 09/01/15.   HTN- controlled on amlodipine  GFR just below 60- monitor today  Diabetes mellitus without complication (HCC) controlled 1000mg  in am and metformin 500mg  in evening hopefully-rtook off 500 last visit but restarted about 2 months later. CBGs around 130 despite starting back. - had been even higher without. Rx for 1000mg  BID- get a1c and decide on 1000, 500 or 1000 BID.  Lab Results  Component Value Date   HGBA1C 6.2 09/15/2015     Psoriatic arthritis ([place]) S:Psoriatic arthritis- on remicade through [facility]. Also sees ID. Patient states his pain is controlled with  combo of remicade and tramadol. He was told by rheumatology they would prefer primary care to prescribe. A/P: Discussed would minimize use- max uses twice a day. I am agreeable to prescribe this but discussed tolerance/addiction potential. Extended counseling provided on non preventative issue and new rx provided not previously given here.    Dyshidrotic eczema S: dry cracking in fingertips. Has been on lidex in past but not previously documented in problem list under this. He will put on and wear gloves for 24 hours and improves when flared up A/P: refilled lidex- ok to use ashe has been doing.    Depression S: was doing well on wellbutrin 150mg  SR BID. Over last few months this has become uncontrolled. PHQ9 from 1 now to 12 but no SI. Compliant with wellbutrin. Also wants tramadol for pain control for psoriatic arthritis A/P: discussed SSRI option but instead of adding more medicine and increasing serotonin syndrome risk would prefer to try treatment with CBT first- strongly advised to call [facility] which he reluctantly agrees to. See me after 6 weeks of sessions to reassess- if not improving may have to try a low dose SSRI perhaps zoloft 50mg . Extended counseling.   6 week follow up  Orders Placed This Encounter  Procedures  . Lipid panel    [place]    Order Specific Question:   Has the patient fasted?    Answer:   No  . CBC with Differential/Platelet  . Comprehensive metabolic panel    [place]    Order Specific Question:   Has the patient fasted?    Answer:   No  . PSA  . Hemoglobin A1c    [place]  . Microalbumin / creatinine urine ratio    [place]  . Hepatitis C antibody, reflex    [facility]    Meds ordered this encounter  Medications  . amLODipine (NORVASC) 10 MG tablet    Sig: Take 1 tablet (10 mg total) by mouth daily.    Dispense:  100 tablet    Refill:  3  . fluocinonide ointment (LIDEX) 0.05 %    Sig: APPLY TOPICALLY ONCE  DAILY AS NEEDED     Dispense:  60 g    Refill:  3  . simvastatin (ZOCOR) 20 MG tablet    Sig: Take 1 tablet (20 mg total) by mouth at bedtime.    Dispense:  100 tablet    Refill:  3  . traMADol (ULTRAM) 50 MG tablet    Sig: Take 1 tablet (50 mg total) by mouth 2 (two) times daily as needed.    Dispense:  60 tablet    Refill:  5  . metFORMIN (GLUCOPHAGE) 1000 MG tablet    Sig: TAKE ONE TABLET BY MOUTH TWICE A DAY    Dispense:  200 tablet    Refill:  3  . glucose blood (ACCU-CHEK AVIVA PLUS) test strip    Sig: USE TO TEST BLOOD SUGAR AS DIRECTED    Dispense:  100 each    Refill:  3   1 new problem- eczema, established psoriatic- transitioning management to me, depression worsening- management required with addition of CBT and prolonged discussion about potential concern with SSRI but fact may need to use. All in addition to preventative services  Return precautions advised.   Garret Reddish, MD

## 2016-02-08 LAB — HEPATITIS C ANTIBODY: HCV AB: NEGATIVE

## 2016-02-27 ENCOUNTER — Other Ambulatory Visit: Payer: Self-pay | Admitting: Family Medicine

## 2016-02-27 DIAGNOSIS — E7800 Pure hypercholesterolemia, unspecified: Secondary | ICD-10-CM

## 2016-03-21 ENCOUNTER — Ambulatory Visit (INDEPENDENT_AMBULATORY_CARE_PROVIDER_SITE_OTHER): Payer: 59 | Admitting: Psychiatry

## 2016-03-21 DIAGNOSIS — F0631 Mood disorder due to known physiological condition with depressive features: Secondary | ICD-10-CM | POA: Diagnosis not present

## 2016-04-03 ENCOUNTER — Ambulatory Visit (INDEPENDENT_AMBULATORY_CARE_PROVIDER_SITE_OTHER): Payer: Medicare Other | Admitting: Family Medicine

## 2016-04-03 ENCOUNTER — Encounter: Payer: Self-pay | Admitting: Family Medicine

## 2016-04-03 DIAGNOSIS — F331 Major depressive disorder, recurrent, moderate: Secondary | ICD-10-CM

## 2016-04-03 DIAGNOSIS — D473 Essential (hemorrhagic) thrombocythemia: Secondary | ICD-10-CM | POA: Diagnosis not present

## 2016-04-03 DIAGNOSIS — D75839 Thrombocytosis, unspecified: Secondary | ICD-10-CM

## 2016-04-03 DIAGNOSIS — E119 Type 2 diabetes mellitus without complications: Secondary | ICD-10-CM

## 2016-04-03 MED ORDER — SERTRALINE HCL 50 MG PO TABS: 50.0000 mg | ORAL_TABLET | Freq: Every day | ORAL | 3 refills | Status: DC

## 2016-04-03 MED ORDER — METFORMIN HCL ER (OSM) 1000 MG PO TB24: 1000.0000 mg | ORAL_TABLET | Freq: Two times a day (BID) | ORAL | 3 refills | Status: DC

## 2016-04-03 NOTE — Assessment & Plan Note (Signed)
S: Last visit reported worsening depression on wellbutrin 150mg  BID with phq9 up to 12.  Wanted to try cbt instead of SSRI (also some serotonin risk). Considered zoloft 50mg  previously. He had a visit with Dr. Ferdinand Lango - she stated biochemical depression after MI and no need for CBT- appeared to recommend medication changes.   PHQ9 reported at 3 today and no SI but think this is underreported because he states he would certainly prefer to feel better if able. He thinks the improvement has been from improved pain control from being back on tramadol A/P: we opted to add  zoloft 50mg  to current wellbutrin with extensive precautions on this and follow up in 6 weeks.

## 2016-04-03 NOTE — Assessment & Plan Note (Signed)
S: Blood sugar 150 up from 130s. 1000mg  twice a day (had tried 500mg  at night but sugars trended up). Even with going back to twice a day feels like sugars are up Lab Results  Component Value Date   HGBA1C 6.3 02/07/2016  loose stools also a concern on this dosage A/P: suspect worsening control plus SE on the metformin IR. We changed to ER as long as affordable with plan 6 week follow up and repeat a1c at that time if at least 3 months out.

## 2016-04-03 NOTE — Progress Notes (Signed)
Pre visit review using our clinic review tool, if applicable. No additional management support is needed unless otherwise documented below in the visit note. 

## 2016-04-03 NOTE — Assessment & Plan Note (Signed)
S: Suspect related to psoriactic arthritis. Stable for several years A/P: continue to monitor with labs. Doubt iron deficiency anemia, nephrotic syndrome, malignancy. Could consider blood smear in future. Followed by [facility] reportedly for remicade but do not see any recent notes through care everywhere- will ask about this at follow up. Would be great if [facility] could comment on the high platelets at follow up.

## 2016-04-03 NOTE — Progress Notes (Signed)
Subjective:  Chase Anderson is a 71 y.o. year old very pleasant male patient who presents for/with See problem oriented charting ROS- admits to feeling down at times and feeling little energy. Tramadol helps with joint pain. No SI. No chest pain.    Past Medical History-  Patient Active Problem List   Diagnosis Date Noted  . Depression 03/27/2015    Priority: High  . Psoriatic arthritis ([place]) 01/25/2013    Priority: High  . Mitral valve prolapse 12/25/2010    Priority: High  . Diabetes mellitus without complication ([place]) 0000000    Priority: High  . CAD (coronary artery disease) with history MI 2004- no intervention 10/06/2006    Priority: High  . Dyshidrotic eczema 02/07/2016    Priority: Medium  . CKD (chronic kidney disease), stage III 09/15/2015    Priority: Medium  . History of BPH 12/14/2009    Priority: Medium  . Hyperlipidemia 10/06/2006    Priority: Medium  . Essential hypertension 10/06/2006    Priority: Medium  . ERECTILE DYSFUNCTION 11/26/2007    Priority: Low  . Thrombocytosis ([place]) 04/03/2016  . Leukocytopenia 11/24/2006  . DERMATITIS, OTHER ATOPIC 11/24/2006    Medications- reviewed and updated Current Outpatient Prescriptions  Medication Sig Dispense Refill  . amLODipine (NORVASC) 10 MG tablet Take 1 tablet (10 mg total) by mouth daily. 100 tablet 3  . aspirin EC 81 MG tablet Take 81 mg by mouth daily.    . BD ULTRA-FINE LANCETS lancets Use once daily for glucose control 100 each 3  . buPROPion (WELLBUTRIN SR) 150 MG 12 hr tablet Take 1 tablet (150 mg total) by mouth 2 (two) times daily. 180 tablet 3  . clindamycin (CLEOCIN) 300 MG capsule Take 300 mg by mouth 3 (three) times daily.    . fluconazole (DIFLUCAN) 100 MG tablet Take 100 mg by mouth daily as needed.     . fluocinonide ointment (LIDEX) 0.05 % APPLY TOPICALLY ONCE DAILY AS NEEDED 60 g 3  . glucose blood (ACCU-CHEK AVIVA PLUS) test strip USE TO TEST BLOOD SUGAR AS DIRECTED 100 each 3  .  InFLIXimab (REMICADE IV) Inject into the vein every 8 (eight) weeks.     . niacin 500 MG tablet Take 500 mg by mouth daily. Take one tab once daily     . nitroGLYCERIN (NITROSTAT) 0.4 MG SL tablet Place 0.4 mg under the tongue every 5 (five) minutes as needed for chest pain. Up to three time and then call the doctor    . simvastatin (ZOCOR) 20 MG tablet Take 1 tablet (20 mg total) by mouth at bedtime. 100 tablet 3  . simvastatin (ZOCOR) 20 MG tablet TAKE 1 TABLET BY MOUTH EVERY NIGHT AT BEDTIME 100 tablet 1  . sulfamethoxazole-trimethoprim (BACTRIM DS,SEPTRA DS) 800-160 MG tablet Take 1 tablet by mouth every Monday, Wednesday, and Friday.  5  . tadalafil (CIALIS) 20 MG tablet Take 0.5-1 tablets (10-20 mg total) by mouth every 3 (three) days as needed for erectile dysfunction. 10 tablet 11  . traMADol (ULTRAM) 50 MG tablet Take 1 tablet (50 mg total) by mouth 2 (two) times daily as needed. 60 tablet 5  . valACYclovir (VALTREX) 500 MG tablet Take 1 tablet (500 mg total) by mouth as needed. (Patient taking differently: Take 500 mg by mouth 3 (three) times daily. ) 60 tablet 3  . metformin (FORTAMET) 1000 MG (OSM) 24 hr tablet Take 1 tablet (1,000 mg total) by mouth 2 (two) times daily with a meal.  180 tablet 3  . sertraline (ZOLOFT) 50 MG tablet Take 1 tablet (50 mg total) by mouth daily. 30 tablet 3   No current facility-administered medications for this visit.     Objective: BP 134/82   Pulse (!) 103   Temp 97.9 F (36.6 C) (Oral)   Ht 5' 11.25" (1.81 m)   Wt 183 lb 3.2 oz (83.1 kg)   SpO2 96%   BMI 25.37 kg/m  Gen: NAD, resting comfortably CV: RRR no murmurs rubs or gallops Lungs: CTAB no crackles, wheeze, rhonchi  Ext: no edema Skin: warm, dry, rash on fingertips from eczema improved from prior  Assessment/Plan:  Depression S: Last visit reported worsening depression on wellbutrin 150mg  BID with phq9 up to 12.  Wanted to try cbt instead of SSRI (also some serotonin risk).  Considered zoloft 50mg  previously. He had a visit with Dr. Ferdinand Lango - she stated biochemical depression after MI and no need for CBT- appeared to recommend medication changes.   PHQ9 reported at 3 today and no SI but think this is underreported because he states he would certainly prefer to feel better if able. He thinks the improvement has been from improved pain control from being back on tramadol A/P: we opted to add  zoloft 50mg  to current wellbutrin with extensive precautions on this and follow up in 6 weeks.   Diabetes mellitus without complication ([place]) S: Blood sugar 150 up from 130s. 1000mg  twice a day (had tried 500mg  at night but sugars trended up). Even with going back to twice a day feels like sugars are up Lab Results  Component Value Date   HGBA1C 6.3 02/07/2016  loose stools also a concern on this dosage A/P: suspect worsening control plus SE on the metformin IR. We changed to ER as long as affordable with plan 6 week follow up and repeat a1c at that time if at least 3 months out.   Thrombocytosis ([place]) S: Suspect related to psoriactic arthritis. Stable for several years A/P: continue to monitor with labs. Doubt iron deficiency anemia, nephrotic syndrome, malignancy. Could consider blood smear in future. Followed by [facility] reportedly for remicade but do not see any recent notes through care everywhere- will ask about this at follow up. Would be great if [facility] could comment on the high platelets at follow up.   6 week- but sooner Return precautions advised.   Meds ordered this encounter  Medications  . metformin (FORTAMET) 1000 MG (OSM) 24 hr tablet    Sig: Take 1 tablet (1,000 mg total) by mouth 2 (two) times daily with a meal.    Dispense:  180 tablet    Refill:  3  . sertraline (ZOLOFT) 50 MG tablet    Sig: Take 1 tablet (50 mg total) by mouth daily.    Dispense:  30 tablet    Refill:  3   Garret Reddish, Chase

## 2016-04-03 NOTE — Patient Instructions (Signed)
Start zoloft 50mg  to see if helps with depression. Continue wellbutrin  Change metformin to extended release version if affordable to see if it helps with loose stools    Taking the medicine as directed and not missing any doses is one of the best things you can do to treat your depression.  Here are some things to keep in mind:  1) Side effects (stomach upset, some increased anxiety) may happen before you notice a benefit.  These side effects typically go away over time. 2) Changes to your dose of medicine or a change in medication all together is sometimes necessary 3) Most people need to be on medication at least 6-12 months 4) Many people will notice an improvement within two weeks but the full effect of the medication can take up to 4-6 weeks 5) Stopping the medication when you start feeling better often results in a return of symptoms 6) If you start having thoughts of hurting yourself or others after starting this medicine, call our office immediately at [phone] or seek care through 911.    Serotonin Syndrome Serotonin is a brain chemical that regulates the nervous system, which includes the brain, spinal cord, and nerves. Serotonin appears to play a role in all types of behavior, including appetite, emotions, movement, thinking, and response to stress. Excessively high levels of serotonin in the body can cause serotonin syndrome, which is a very dangerous condition. What are the causes? This condition can be caused by taking medicines or drugs that increase the level of serotonin in your body. These include:  Antidepressant medicines.  Migraine medicines.  Certain pain medicines.  Certain recreational drugs, including ecstasy, LSD, cocaine, and amphetamines.  Over-the-counter cough or cold medicines that contain dextromethorphan.  Certain herbal supplements, including St. John's wort, ginseng, and nutmeg. This condition usually occurs when you take these medicines or drugs  in combination, but it can also happen with a high dose of a single medicine or drug. What increases the risk? This condition is more likely to develop in:  People who have recently increased the dosage of medicine that increases the serotonin level.  People who just started taking medicine that increases the serotonin level. What are the signs or symptoms? Symptoms of this condition usually happens within several hours of a medicine change. Symptoms include:  Headache.  Muscle twitching or stiffness.  Diarrhea.  Confusion.  Restlessness or agitation.  Shivering or goose bumps.  Loss of muscle coordination.  Rapid heart rate.  Sweating. Severe cases of serotonin syndromecan cause:  Irregular heartbeat.  Seizures.  Loss of consciousness.  High fever. How is this diagnosed? This condition is diagnosed with a medical history and physical exam. You will be asked aboutyour symptoms and your use of medicines and recreational drugs. Your health care provider may also order lab work or additional tests to rule out other causes of your symptoms. How is this treated? The treatment for this condition depends on the severity of your symptoms. For mild cases, stopping the medicine that caused your condition is usually all that is needed. For moderate to severe cases, hospitalization is required to monitor you and to prevent further muscle damage. Follow these instructions at home:  Take over-the-counter and prescription medicines only as told by your health care provider. This is important.  Check with your health care provider before you start taking any new prescriptions, over-the-counter medicines, herbs, or supplements.  Avoid combining any medicines that can cause this condition to occur.  Keep all  follow-up visits as told by your health care provider.This is important.  Maintain a healthy lifestyle.  Eat healthy foods.  Get plenty of sleep.  Exercise  regularly.  Do not drink alcohol.  Do not use recreational drugs. Contact a health care provider if:  Medicines do not seem to be helping.  Your symptoms do not improve or they get worse.  You have trouble taking care of yourself. Get help right away if:  You have worsening confusion, severe headache, chest pain, high fever, seizures, or loss of consciousness.  You have serious thoughts about hurting yourself or others.  You experience serious side effects of medicine, such as swelling of your face, lips, tongue, or throat. This information is not intended to replace advice given to you by your health care provider. Make sure you discuss any questions you have with your health care provider. Document Released: 04/04/2004 Document Revised: 10/21/2015 Document Reviewed: 03/10/2014 Elsevier Interactive Patient Education  2017 [employer].

## 2016-04-04 ENCOUNTER — Telehealth: Payer: Self-pay

## 2016-04-04 NOTE — Telephone Encounter (Signed)
Received PA request for Metformin OSM tablets from [facility]. PA submitted & is pending. Key: [id number]

## 2016-04-05 NOTE — Telephone Encounter (Signed)
Received form requesting more info from insurance company. Form filled out & faxed back.

## 2016-04-08 MED ORDER — METFORMIN HCL ER 500 MG PO TB24: 1000.0000 mg | ORAL_TABLET | Freq: Every day | ORAL | 5 refills | Status: DC

## 2016-04-08 NOTE — Telephone Encounter (Signed)
Spoke to patient who verbalized understanding and will pick up medication from the pharmacy

## 2016-04-08 NOTE — Telephone Encounter (Signed)
PA denied.  (1) You have a history of greater than or equal to a 12 week trial of metformin extended-release (generic Glucophage XR) AND One of the following: (a) documented history of an inadequate response to metformin extended-release (generic Glucophage XR) as evidenced by a hemoglobin A1c level that is above goal; OR (b) documented history of intolerance to metformin extended-release (generic Glucophage XR) which is unable to be resolved with attempts to minimize the adverse effects where appropriate.  AND (3) you have a history of greater than or equal to a 12 week trial of metformin immediate-release (which patient has) One of the following: (a) documented history of an inadequate response to metformin immediate-release that is evidenced by a hemoglobin A1c level that is above goal; OR (b) documented history of intolerance to metformin immediate-release which is unable to be resolved with attempts to minimize the adverse effects where appropriate.

## 2016-04-08 NOTE — Telephone Encounter (Signed)
I changed to metformin XR. Will have to take 2 pills twice a day. Please inform patient.

## 2016-04-08 NOTE — Addendum Note (Signed)
Addended by: Marin Olp on: 04/08/2016 12:42 PM   Modules accepted: Orders

## 2016-05-20 ENCOUNTER — Ambulatory Visit (INDEPENDENT_AMBULATORY_CARE_PROVIDER_SITE_OTHER): Payer: Medicare Other | Admitting: Family Medicine

## 2016-05-20 ENCOUNTER — Encounter: Payer: Self-pay | Admitting: Family Medicine

## 2016-05-20 VITALS — BP 118/68 | HR 101 | Temp 98.1°F | Ht 71.25 in | Wt 186.6 lb

## 2016-05-20 DIAGNOSIS — F331 Major depressive disorder, recurrent, moderate: Secondary | ICD-10-CM

## 2016-05-20 DIAGNOSIS — E785 Hyperlipidemia, unspecified: Secondary | ICD-10-CM | POA: Diagnosis not present

## 2016-05-20 DIAGNOSIS — E119 Type 2 diabetes mellitus without complications: Secondary | ICD-10-CM | POA: Diagnosis not present

## 2016-05-20 DIAGNOSIS — I10 Essential (primary) hypertension: Secondary | ICD-10-CM

## 2016-05-20 DIAGNOSIS — D473 Essential (hemorrhagic) thrombocythemia: Secondary | ICD-10-CM | POA: Diagnosis not present

## 2016-05-20 DIAGNOSIS — D75839 Thrombocytosis, unspecified: Secondary | ICD-10-CM

## 2016-05-20 LAB — POCT GLYCOSYLATED HEMOGLOBIN (HGB A1C): Hemoglobin A1C: 6.4

## 2016-05-20 MED ORDER — SERTRALINE HCL 100 MG PO TABS: 100.0000 mg | ORAL_TABLET | Freq: Every day | ORAL | 5 refills | Status: DC

## 2016-05-20 NOTE — Assessment & Plan Note (Signed)
S: on wellbutrin 150mg  SR BID with phq9 of 12 at last visit. Saw Dr. Ferdinand Lango with one visit and no follow up advised. PHQ9 was down to 3 and no SI but patient seemd to underreport- we added zoloft 50mg  at that time. Discussed serotonin syndrome risk.   PHQ9 up to 7 today. States does not feel improved.  A/P: will increase zoloft to 100mg  and follow up 6-8 weeks. Would consider change to alternate instead of increasing above 100mg  - possibly lexapro 10mg 

## 2016-05-20 NOTE — Assessment & Plan Note (Signed)
S: controlled on amlodipine 10mg  on repeat.  BP Readings from Last 3 Encounters:  05/20/16 (!) 142/70  04/03/16 134/82  02/07/16 110/70  A/P:Continue current meds:  Doing well

## 2016-05-20 NOTE — Assessment & Plan Note (Signed)
S: well controlled. On ER--> Metformin 1000mg  at night, 500mg  . Changed from IR last visit.  CBGs- usually at least 130 and up to 160 fasting. But very active walking in daytime Lab Results  Component Value Date   HGBA1C 6.4 05/20/2016   HGBA1C 6.3 02/07/2016   HGBA1C 6.2 09/15/2015   A/P: continue current medication- looks great. Wonder if his a1c is fully accurate though with blood issues

## 2016-05-20 NOTE — Progress Notes (Signed)
Pre visit review using our clinic review tool, if applicable. No additional management support is needed unless otherwise documented below in the visit note. 

## 2016-05-20 NOTE — Patient Instructions (Addendum)
Diabetes looks great Lab Results  Component Value Date   HGBA1C 6.4 05/20/2016  no changes today  Increase zoloft to 100mg . Follow up 6-8 weeks. Risk for serotonin syndrome with tramadol (doubt this will happen but please be aware of risk)   Serotonin Syndrome Serotonin is a brain chemical that regulates the nervous system, which includes the brain, spinal cord, and nerves. Serotonin appears to play a role in all types of behavior, including appetite, emotions, movement, thinking, and response to stress. Excessively high levels of serotonin in the body can cause serotonin syndrome, which is a very dangerous condition. What are the causes? This condition can be caused by taking medicines or drugs that increase the level of serotonin in your body. These include:  Antidepressant medicines.  Migraine medicines.  Certain pain medicines.  Certain recreational drugs, including ecstasy, LSD, cocaine, and amphetamines.  Over-the-counter cough or cold medicines that contain dextromethorphan.  Certain herbal supplements, including St. John's wort, ginseng, and nutmeg. This condition usually occurs when you take these medicines or drugs in combination, but it can also happen with a high dose of a single medicine or drug. What increases the risk? This condition is more likely to develop in:  People who have recently increased the dosage of medicine that increases the serotonin level.  People who just started taking medicine that increases the serotonin level. What are the signs or symptoms? Symptoms of this condition usually happens within several hours of a medicine change. Symptoms include:  Headache.  Muscle twitching or stiffness.  Diarrhea.  Confusion.  Restlessness or agitation.  Shivering or goose bumps.  Loss of muscle coordination.  Rapid heart rate.  Sweating. Severe cases of serotonin syndromecan cause:  Irregular heartbeat.  Seizures.  Loss of  consciousness.  High fever. How is this diagnosed? This condition is diagnosed with a medical history and physical exam. You will be asked aboutyour symptoms and your use of medicines and recreational drugs. Your health care provider may also order lab work or additional tests to rule out other causes of your symptoms. How is this treated? The treatment for this condition depends on the severity of your symptoms. For mild cases, stopping the medicine that caused your condition is usually all that is needed. For moderate to severe cases, hospitalization is required to monitor you and to prevent further muscle damage. Follow these instructions at home:  Take over-the-counter and prescription medicines only as told by your health care provider. This is important.  Check with your health care provider before you start taking any new prescriptions, over-the-counter medicines, herbs, or supplements.  Avoid combining any medicines that can cause this condition to occur.  Keep all follow-up visits as told by your health care provider.This is important.  Maintain a healthy lifestyle.  Eat healthy foods.  Get plenty of sleep.  Exercise regularly.  Do not drink alcohol.  Do not use recreational drugs. Contact a health care provider if:  Medicines do not seem to be helping.  Your symptoms do not improve or they get worse.  You have trouble taking care of yourself. Get help right away if:  You have worsening confusion, severe headache, chest pain, high fever, seizures, or loss of consciousness.  You have serious thoughts about hurting yourself or others.  You experience serious side effects of medicine, such as swelling of your face, lips, tongue, or throat. This information is not intended to replace advice given to you by your health care provider. Make sure you discuss  any questions you have with your health care provider. Document Released: 04/04/2004 Document Revised: 10/21/2015  Document Reviewed: 03/10/2014 Elsevier Interactive Patient Education  2017 [employer].

## 2016-05-20 NOTE — Assessment & Plan Note (Signed)
?   If related to psoriatic arthritis or remicade. 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 He has been seen by Dr. Benay Spice once a year about 10 years ago and he states he was released- I cannot view those notes. Discussed referral back to hematology- with prior workup and stability we opted not to at this time.

## 2016-05-20 NOTE — Progress Notes (Signed)
Subjective:  Chase Anderson is a 71 y.o. year old very pleasant male patient who presents for/with See problem oriented charting ROS- No chest pain or shortness of breath. No headache or blurry vision.  Admits to depressed mood. No SI.    Past Medical History-  Patient Active Problem List   Diagnosis Date Noted  . Thrombocytosis ([place]) 04/03/2016    Priority: High  . Depression 03/27/2015    Priority: High  . Psoriatic arthritis ([place]) 01/25/2013    Priority: High  . Mitral valve prolapse 12/25/2010    Priority: High  . Diabetes mellitus without complication ([place]) 17/51/0258    Priority: High  . CAD (coronary artery disease) with history MI 2004- no intervention 10/06/2006    Priority: High  . Dyshidrotic eczema 02/07/2016    Priority: Medium  . CKD (chronic kidney disease), stage III 09/15/2015    Priority: Medium  . History of BPH 12/14/2009    Priority: Medium  . Hyperlipidemia 10/06/2006    Priority: Medium  . Essential hypertension 10/06/2006    Priority: Medium  . ERECTILE DYSFUNCTION 11/26/2007    Priority: Low  . Leukocytopenia 11/24/2006  . DERMATITIS, OTHER ATOPIC 11/24/2006    Medications- reviewed and updated Current Outpatient Prescriptions  Medication Sig Dispense Refill  . amLODipine (NORVASC) 10 MG tablet Take 1 tablet (10 mg total) by mouth daily. 100 tablet 3  . aspirin EC 81 MG tablet Take 81 mg by mouth daily.    . BD ULTRA-FINE LANCETS lancets Use once daily for glucose control 100 each 3  . buPROPion (WELLBUTRIN SR) 150 MG 12 hr tablet Take 1 tablet (150 mg total) by mouth 2 (two) times daily. 180 tablet 3  . clindamycin (CLEOCIN) 300 MG capsule Take 300 mg by mouth 3 (three) times daily.    . fluconazole (DIFLUCAN) 100 MG tablet Take 100 mg by mouth daily as needed.     . fluocinonide ointment (LIDEX) 0.05 % APPLY TOPICALLY ONCE DAILY AS NEEDED 60 g 3  . glucose blood (ACCU-CHEK AVIVA PLUS) test strip USE TO TEST BLOOD SUGAR AS DIRECTED 100 each 3   . InFLIXimab (REMICADE IV) Inject into the vein every 8 (eight) weeks.     . metFORMIN (GLUCOPHAGE-XR) 500 MG 24 hr tablet Take 2 tablets (1,000 mg total) by mouth daily with breakfast. 120 tablet 5  . niacin 500 MG tablet Take 500 mg by mouth daily. Take one tab once daily     . nitroGLYCERIN (NITROSTAT) 0.4 MG SL tablet Place 0.4 mg under the tongue every 5 (five) minutes as needed for chest pain. Up to three time and then call the doctor    . sertraline (ZOLOFT) 50 MG tablet Take 1 tablet (50 mg total) by mouth daily. 30 tablet 3  . simvastatin (ZOCOR) 20 MG tablet Take 1 tablet (20 mg total) by mouth at bedtime. 100 tablet 3  . sulfamethoxazole-trimethoprim (BACTRIM DS,SEPTRA DS) 800-160 MG tablet Take 1 tablet by mouth every Monday, Wednesday, and Friday.  5  . tadalafil (CIALIS) 20 MG tablet Take 0.5-1 tablets (10-20 mg total) by mouth every 3 (three) days as needed for erectile dysfunction. 10 tablet 11  . traMADol (ULTRAM) 50 MG tablet Take 1 tablet (50 mg total) by mouth 2 (two) times daily as needed. 60 tablet 5  . valACYclovir (VALTREX) 500 MG tablet Take 1 tablet (500 mg total) by mouth as needed. (Patient taking differently: Take 500 mg by mouth 3 (three) times daily. ) 60  tablet 3   No current facility-administered medications for this visit.     Objective: BP 118/68   Pulse (!) 101   Temp 98.1 F (36.7 C) (Oral)   Ht 5' 11.25" (1.81 m)   Wt 186 lb 9.6 oz (84.6 kg)   SpO2 94%   BMI 25.84 kg/m  Gen: NAD, resting comfortably CV: RRR no murmurs rubs or gallops. Not tachycardic on my repeat. Was [place] in from the [place] and states anxiety when he came in.  Lungs: CTAB no crackles, wheeze, rhonchi Ext: no edema Skin: warm, dry, some callous on bottom of feet not noted below  Diabetic Foot Exam - Simple   Simple Foot Form Diabetic Foot exam was performed with the following findings:  Yes 05/20/2016 11:09 AM  Visual Inspection No deformities, no ulcerations, no other skin  breakdown bilaterally:  Yes Sensation Testing Intact to touch and monofilament testing bilaterally:  Yes Pulse Check Posterior Tibialis and Dorsalis pulse intact bilaterally:  Yes Comments    Assessment/Plan:  Depression S: on wellbutrin 150mg  SR BID with phq9 of 12 at last visit. Saw Dr. Ferdinand Anderson with one visit and no follow up advised. PHQ9 was down to 3 and no SI but patient seemd to underreport- we added zoloft 50mg  at that time. Discussed serotonin syndrome risk.   PHQ9 up to 7 today. States does not feel improved.  A/P: will increase zoloft to 100mg  and follow up 6-8 weeks. Would consider change to alternate instead of increasing above 100mg  - possibly lexapro 10mg   Diabetes mellitus without complication ([place]) S: well controlled. On ER--> Metformin 1000mg  at night, 500mg  . Changed from IR last visit.  CBGs- usually at least 130 and up to 160 fasting. But very active walking in daytime Lab Results  Component Value Date   HGBA1C 6.4 05/20/2016   HGBA1C 6.3 02/07/2016   HGBA1C 6.2 09/15/2015   A/P: continue current medication- looks great. Wonder if his a1c is fully accurate though with blood issues   Essential hypertension S: controlled on amlodipine 10mg  on repeat.  BP Readings from Last 3 Encounters:  05/20/16 (!) 142/70  04/03/16 134/82  02/07/16 110/70  A/P:Continue current meds:  Doing well  Thrombocytosis ([place]) ? If related to psoriatic arthritis or remicade. 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 He has been seen by Dr. Benay Anderson once a year about 10 years ago and he states he was released- I cannot view those notes. Discussed referral back to hematology- with prior workup and stability we opted not to at this time.   6-8 weeks  Orders Placed This Encounter  Procedures  . POCT glycosylated hemoglobin (Hb A1C)   Return precautions advised.  Chase Reddish, MD

## 2016-07-02 ENCOUNTER — Encounter: Payer: Self-pay | Admitting: Family Medicine

## 2016-07-02 ENCOUNTER — Ambulatory Visit (INDEPENDENT_AMBULATORY_CARE_PROVIDER_SITE_OTHER): Payer: Medicare Other | Admitting: Family Medicine

## 2016-07-02 VITALS — BP 118/70 | HR 98 | Temp 98.5°F | Ht 71.25 in | Wt 183.4 lb

## 2016-07-02 DIAGNOSIS — F331 Major depressive disorder, recurrent, moderate: Secondary | ICD-10-CM

## 2016-07-02 DIAGNOSIS — E119 Type 2 diabetes mellitus without complications: Secondary | ICD-10-CM | POA: Diagnosis not present

## 2016-07-02 DIAGNOSIS — I10 Essential (primary) hypertension: Secondary | ICD-10-CM | POA: Diagnosis not present

## 2016-07-02 MED ORDER — SERTRALINE HCL 100 MG PO TABS: 150.0000 mg | ORAL_TABLET | Freq: Every day | ORAL | 5 refills | Status: DC

## 2016-07-02 NOTE — Patient Instructions (Signed)
Increase zoloft to 150mg  (one and one half tablet) follow up 6 weeks. If any thoughts of hurting yourself- contact us immediately  Blood pressure looks good today  keepa log of blood sugars - in the morning but also check other times in the day and write down times- most helpful times are before meals and 2 hours after meals and before bed.

## 2016-07-02 NOTE — Assessment & Plan Note (Signed)
S: compliant with wellbutrin 150mg  BID and zoloft 100mg . 6 weeks since increase to 100mg  zoloft from 50mg . Has tried counseling with Dr. Ferdinand Lango and advised no further follow up by her.  PHQ9 today of 9 which is mildly worsened from score of 7 last visit. He does feel that he has some days that are better for the first time.  A/P: poor control but some mild improvement. we will titrate zoloft to 150mg . No SI. Continue wellbutrin

## 2016-07-02 NOTE — Progress Notes (Signed)
Pre visit review using our clinic review tool, if applicable. No additional management support is needed unless otherwise documented below in the visit note. 

## 2016-07-02 NOTE — Progress Notes (Signed)
Subjective:  Chase Anderson is a 71 y.o. year old very pleasant male patient who presents for/with See problem oriented charting ROS- no chest pain or shortness of breath. Some depressed mood. No SI. Denies anxiety   Past Medical History-  Patient Active Problem List   Diagnosis Date Noted  . Thrombocytosis ([place]) 04/03/2016    Priority: High  . Depression 03/27/2015    Priority: High  . Psoriatic arthritis ([place]) 01/25/2013    Priority: High  . Mitral valve prolapse 12/25/2010    Priority: High  . Diabetes mellitus without complication ([place]) 85/27/7824    Priority: High  . CAD (coronary artery disease) with history MI 2004- no intervention 10/06/2006    Priority: High  . Dyshidrotic eczema 02/07/2016    Priority: Medium  . CKD (chronic kidney disease), stage III 09/15/2015    Priority: Medium  . History of BPH 12/14/2009    Priority: Medium  . Hyperlipidemia 10/06/2006    Priority: Medium  . Essential hypertension 10/06/2006    Priority: Medium  . ERECTILE DYSFUNCTION 11/26/2007    Priority: Low  . Leukocytopenia 11/24/2006    Priority: Low    Medications- reviewed and updated Current Outpatient Prescriptions  Medication Sig Dispense Refill  . amLODipine (NORVASC) 10 MG tablet Take 1 tablet (10 mg total) by mouth daily. 100 tablet 3  . aspirin EC 81 MG tablet Take 81 mg by mouth daily.    . BD ULTRA-FINE LANCETS lancets Use once daily for glucose control 100 each 3  . buPROPion (WELLBUTRIN SR) 150 MG 12 hr tablet Take 1 tablet (150 mg total) by mouth 2 (two) times daily. 180 tablet 3  . clindamycin (CLEOCIN) 300 MG capsule Take 300 mg by mouth 3 (three) times daily.    . fluconazole (DIFLUCAN) 100 MG tablet Take 100 mg by mouth daily as needed.     . fluocinonide ointment (LIDEX) 0.05 % APPLY TOPICALLY ONCE DAILY AS NEEDED 60 g 3  . glucose blood (ACCU-CHEK AVIVA PLUS) test strip USE TO TEST BLOOD SUGAR AS DIRECTED 100 each 3  . InFLIXimab (REMICADE IV) Inject into the  vein every 8 (eight) weeks.     . metFORMIN (GLUCOPHAGE-XR) 500 MG 24 hr tablet Take 2 tablets (1,000 mg total) by mouth daily with breakfast. 120 tablet 5  . niacin 500 MG tablet Take 500 mg by mouth daily. Take one tab once daily     . nitroGLYCERIN (NITROSTAT) 0.4 MG SL tablet Place 0.4 mg under the tongue every 5 (five) minutes as needed for chest pain. Up to three time and then call the doctor    . sertraline (ZOLOFT) 100 MG tablet Take 1.5 tablets (150 mg total) by mouth daily. 45 tablet 5  . simvastatin (ZOCOR) 20 MG tablet Take 1 tablet (20 mg total) by mouth at bedtime. 100 tablet 3  . sulfamethoxazole-trimethoprim (BACTRIM DS,SEPTRA DS) 800-160 MG tablet Take 1 tablet by mouth every Monday, Wednesday, and Friday.  5  . tadalafil (CIALIS) 20 MG tablet Take 0.5-1 tablets (10-20 mg total) by mouth every 3 (three) days as needed for erectile dysfunction. 10 tablet 11  . traMADol (ULTRAM) 50 MG tablet Take 1 tablet (50 mg total) by mouth 2 (two) times daily as needed. 60 tablet 5  . valACYclovir (VALTREX) 500 MG tablet Take 1 tablet (500 mg total) by mouth as needed. (Patient taking differently: Take 500 mg by mouth 3 (three) times daily. ) 60 tablet 3   No current facility-administered  medications for this visit.     Objective: BP 118/70 (BP Location: Left Arm, Patient Position: Sitting, Cuff Size: Large)   Pulse 98   Temp 98.5 F (36.9 C) (Oral)   Ht 5' 11.25" (1.81 m)   Wt 183 lb 6.4 oz (83.2 kg)   SpO2 96%   BMI 25.40 kg/m  Gen: NAD, resting comfortably CV: RRR. In mitral area- 2/6 midsystolic murmur Lungs: CTAB no crackles, wheeze, rhonchi Abdomen: soft/nontender/nondistended/normal bowel sounds.  Ext: no edema Skin: warm, dry  Assessment/Plan:  Labs at [facility] upcoming - so declines today  Hypertension S: controlled on amlodipine 10mg .  BP Readings from Last 3 Encounters:  07/02/16 118/70  05/20/16 118/68  04/03/16 134/82  A/P:Continue current meds:  Doing  well  Depression S: compliant with wellbutrin 150mg  BID and zoloft 100mg . 6 weeks since increase to 100mg  zoloft from 50mg . Has tried counseling with Dr. Ferdinand Lango and advised no further follow up by her.  PHQ9 today of 9 which is mildly worsened from score of 7 last visit. He does feel that he has some days that are better for the first time.  A/P: poor control but some mild improvement. we will titrate zoloft to 150mg . No SI. Continue wellbutrin  Diabetes mellitus without complication (HCC) S: Metformin ER 1000mg . . Fasting sugars 150-175 though not checking in the day A/P: will log his sugars before meals and 2 hours after meals. May need to go to 1500mg  metformin XR.   Return in about 6 weeks (around 08/13/2016).  Meds ordered this encounter  Medications  . sertraline (ZOLOFT) 100 MG tablet    Sig: Take 1.5 tablets (150 mg total) by mouth daily.    Dispense:  45 tablet    Refill:  5    Return precautions advised.  Garret Reddish, MD

## 2016-07-02 NOTE — Assessment & Plan Note (Signed)
S: Metformin ER 1000mg . . Fasting sugars 150-175 though not checking in the day A/P: will log his sugars before meals and 2 hours after meals. May need to go to 1500mg  metformin XR.

## 2016-08-13 ENCOUNTER — Ambulatory Visit (INDEPENDENT_AMBULATORY_CARE_PROVIDER_SITE_OTHER): Payer: Medicare Other | Admitting: Family Medicine

## 2016-08-13 ENCOUNTER — Encounter: Payer: Self-pay | Admitting: Family Medicine

## 2016-08-13 DIAGNOSIS — E119 Type 2 diabetes mellitus without complications: Secondary | ICD-10-CM | POA: Diagnosis not present

## 2016-08-13 DIAGNOSIS — F331 Major depressive disorder, recurrent, moderate: Secondary | ICD-10-CM | POA: Diagnosis not present

## 2016-08-13 MED ORDER — METFORMIN HCL ER 750 MG PO TB24: 1500.0000 mg | ORAL_TABLET | Freq: Every day | ORAL | 5 refills | Status: DC

## 2016-08-13 MED ORDER — DESVENLAFAXINE SUCCINATE ER 50 MG PO TB24: 50.0000 mg | ORAL_TABLET | Freq: Every day | ORAL | 3 refills | Status: DC

## 2016-08-13 NOTE — Progress Notes (Signed)
Subjective:  Chase Anderson is a 71 y.o. year old very pleasant male patient who presents for/with See problem oriented charting ROS- No Si. Some dizzy spells when standing up or changing positions- not just with head movement. Denies vertigo. Occasional very mild headache. No fever or chills   Past Medical History-  Patient Active Problem List   Diagnosis Date Noted  . Thrombocytosis ([place]) 04/03/2016    Priority: High  . Depression 03/27/2015    Priority: High  . Psoriatic arthritis ([place]) 01/25/2013    Priority: High  . Mitral valve prolapse 12/25/2010    Priority: High  . Leukocytopenia 11/24/2006    Priority: High  . Diabetes mellitus without complication ([place]) 57/32/2025    Priority: High  . CAD (coronary artery disease) with history MI 2004- no intervention 10/06/2006    Priority: High  . Dyshidrotic eczema 02/07/2016    Priority: Medium  . CKD (chronic kidney disease), stage III 09/15/2015    Priority: Medium  . History of BPH 12/14/2009    Priority: Medium  . Hyperlipidemia 10/06/2006    Priority: Medium  . Essential hypertension 10/06/2006    Priority: Medium  . ERECTILE DYSFUNCTION 11/26/2007    Priority: Low    Medications- reviewed and updated   Objective: BP 130/80 (BP Location: Left Arm, Patient Position: Sitting, Cuff Size: Large)   Pulse 88   Temp 98.3 F (36.8 C) (Oral)   Ht 5' 11.25" (1.81 m)   Wt 183 lb (83 kg)   SpO2 93%   BMI 25.34 kg/m  Gen: NAD, resting comfortably CV: RRR no murmurs rubs or gallops Lungs: CTAB no crackles, wheeze, rhonchi Ext: no edema Skin: warm, dry Neuro: CN II-XII intact, sensation and reflexes normal throughout, 5/5 muscle strength in bilateral upper and lower extremities. Normal finger to nose. Normal rapid alternating movements. No pronator drift. Normal romberg. Normal gait.   Assessment/Plan:  Depression S: patient compliant with wellbutrin 150mg  BID and zoloft 150mg  (increase last visit 6 weeks ago). Dr.  Ferdinand Lango of [facility] had advised no further follow up after one counseling session.   PHQ9 on prior visits has ranged from 7-9. He did feel like he was having some "better days" at last visit.   TOday, phq9 of 10. Denies SI. He has had rather significant side effects on the medicine though- states with dose increase started to feel lightheaded with standing and seems to be worsening. Feels like slightly affects memory.  A/P: will stop zoloft- see avs for quick taper off and transition to pristiq 50mg  with follow up in 2-3 weeks. Continue wellbutrin 150mg  SR BID  Regarding lightheadedness- reassuring neurological exam today. Discussed if worsening symptoms before 2-3 weeks should see [place] back. Will also reduce amlodipine to 1/2 tablet short term until we see him back  Diabetes mellitus without complication (HCC) S: compliant with metformin ER 1000mg . Fasting sugars have been 150-175 reported last visit. Today, states AM range has been 130-180. He also checked before meals and 2 hours after meals since last visit and at least 25% of readings above 200.  Lab Results  Component Value Date   HGBA1C 6.4 05/20/2016  A/P: increase metformin to 1500mg  XL daily  2-3 week follow up  Meds ordered this encounter  Medications  . desvenlafaxine (PRISTIQ) 50 MG 24 hr tablet    Sig: Take 1 tablet (50 mg total) by mouth daily.    Dispense:  30 tablet    Refill:  3  . metFORMIN (  GLUCOPHAGE-XR) 750 MG 24 hr tablet    Sig: Take 2 tablets (1,500 mg total) by mouth daily with breakfast.    Dispense:  60 tablet    Refill:  5    Return precautions advised.  Garret Reddish, MD

## 2016-08-13 NOTE — Assessment & Plan Note (Signed)
S: compliant with metformin ER 1000mg . Fasting sugars have been 150-175 reported last visit. Today, states AM range has been 130-180. He also checked before meals and 2 hours after meals since last visit and at least 25% of readings above 200.  Lab Results  Component Value Date   HGBA1C 6.4 05/20/2016  A/P: increase metformin to 1500mg  XL daily

## 2016-08-13 NOTE — Patient Instructions (Addendum)
increase metformin to 1500mg  XL daily (two of the 750mg  tablets)  Tomorrow and Thursday- take half tablet of zoloft only Start full pristiq tablet tomorrow and continue this.   Follow up 2-3 weeks to see if side effects are better though likely will take longer if this change will work for depression  Until next visit also want you to take only half of your amlodipine 10mg  (so taking 5mg )   See [place] back sooner for worsening dizziness symptoms. Call us immediately or seek care immediately if you have any thoughts of hurting yourself.

## 2016-08-13 NOTE — Assessment & Plan Note (Addendum)
S: patient compliant with wellbutrin 150mg  BID and zoloft 150mg  (increase last visit 6 weeks ago). Dr. Ferdinand Lango of [facility] had advised no further follow up after one counseling session.   PHQ9 on prior visits has ranged from 7-9. He did feel like he was having some "better days" at last visit.   TOday, phq9 of 10. Denies SI. He has had rather significant side effects on the medicine though- states with dose increase started to feel lightheaded with standing and seems to be worsening. Feels like slightly affects memory.  A/P: will stop zoloft- see avs for quick taper off and transition to pristiq 50mg  with follow up in 2-3 weeks. Continue wellbutrin 150mg  SR BID. Presume orthostatic symptoms are related to the zoloft since started after he increased dose. In addition zoloft has not seemed to help him much. We also discussed potential psychiatry referral  Regarding lightheadedness- reassuring neurological exam today. Discussed if worsening symptoms before 2-3 weeks should see [place] back. Will also reduce amlodipine to 1/2 tablet short term until we see him back

## 2016-08-28 ENCOUNTER — Ambulatory Visit (INDEPENDENT_AMBULATORY_CARE_PROVIDER_SITE_OTHER): Payer: Medicare Other | Admitting: Family Medicine

## 2016-08-28 ENCOUNTER — Encounter: Payer: Self-pay | Admitting: Family Medicine

## 2016-08-28 VITALS — BP 122/74 | HR 96 | Temp 98.1°F | Ht 71.25 in | Wt 178.8 lb

## 2016-08-28 DIAGNOSIS — I10 Essential (primary) hypertension: Secondary | ICD-10-CM | POA: Diagnosis not present

## 2016-08-28 DIAGNOSIS — L4050 Arthropathic psoriasis, unspecified: Secondary | ICD-10-CM

## 2016-08-28 DIAGNOSIS — E119 Type 2 diabetes mellitus without complications: Secondary | ICD-10-CM

## 2016-08-28 DIAGNOSIS — F330 Major depressive disorder, recurrent, mild: Secondary | ICD-10-CM

## 2016-08-28 LAB — POCT GLYCOSYLATED HEMOGLOBIN (HGB A1C): HEMOGLOBIN A1C: 7.1

## 2016-08-28 MED ORDER — TRAMADOL HCL 50 MG PO TABS: 50.0000 mg | ORAL_TABLET | Freq: Two times a day (BID) | ORAL | 5 refills | Status: DC | PRN

## 2016-08-28 NOTE — Assessment & Plan Note (Signed)
S: increased metformin to 1500mg  last visit with CBGs after meals often over 200. Baseline fasting was creeping up some too. #s fasting are similar so far with the change to 1500mg  Lab Results  Component Value Date   HGBA1C 7.1 08/28/2016  A/P: a1c up to 7.1 but only 2 weeks of increased metformin. Will continue current dose and follow up 14 weeks

## 2016-08-28 NOTE — Assessment & Plan Note (Signed)
S: PHQ9 of 10 last visit up from prior 7-9 despite wlelbutrin 150mg  BID and zoloft 150mg . Was feeling lightheaded with higher dose with standing and seemed to be getting worse and memory felt foggy. We titrated off zoloft and added pristq  Today, PHQ9 of 6. Does not have memory fog and lightheadedness is better A/P: will continue pristiq 50mg  and wellbutrin 150mg  BID and follow up in 1 month. No SI

## 2016-08-28 NOTE — Progress Notes (Signed)
Subjective:  Chase Anderson is a 71 y.o. year old very pleasant male patient who presents for/with See problem oriented charting ROS- no SI. Some depressed mood and anhedonia but improved. Less lightheadedness  . No chest pain  Past Medical History-  Patient Active Problem List   Diagnosis Date Noted  . Thrombocytosis ([place]) 04/03/2016    Priority: High  . Depression 03/27/2015    Priority: High  . Psoriatic arthritis ([place]) 01/25/2013    Priority: High  . Mitral valve prolapse 12/25/2010    Priority: High  . Leukocytopenia 11/24/2006    Priority: High  . Diabetes mellitus without complication ([place]) 47/65/4650    Priority: High  . CAD (coronary artery disease) with history MI 2004- no intervention 10/06/2006    Priority: High  . Dyshidrotic eczema 02/07/2016    Priority: Medium  . CKD (chronic kidney disease), stage III 09/15/2015    Priority: Medium  . History of BPH 12/14/2009    Priority: Medium  . Hyperlipidemia 10/06/2006    Priority: Medium  . Essential hypertension 10/06/2006    Priority: Medium  . ERECTILE DYSFUNCTION 11/26/2007    Priority: Low    Medications- reviewed and updated Current Outpatient Prescriptions  Medication Sig Dispense Refill  . amLODipine (NORVASC) 10 MG tablet Take 1 tablet (10 mg total) by mouth daily. 100 tablet 3  . aspirin EC 81 MG tablet Take 81 mg by mouth daily.    . BD ULTRA-FINE LANCETS lancets Use once daily for glucose control 100 each 3  . buPROPion (WELLBUTRIN SR) 150 MG 12 hr tablet Take 1 tablet (150 mg total) by mouth 2 (two) times daily. 180 tablet 3  . clindamycin (CLEOCIN) 300 MG capsule Take 300 mg by mouth 3 (three) times daily.    Marland Kitchen desvenlafaxine (PRISTIQ) 50 MG 24 hr tablet Take 1 tablet (50 mg total) by mouth daily. 30 tablet 3  . fluconazole (DIFLUCAN) 100 MG tablet Take 100 mg by mouth daily as needed.     . fluocinonide ointment (LIDEX) 0.05 % APPLY TOPICALLY ONCE DAILY AS NEEDED 60 g 3  . glucose blood (ACCU-CHEK  AVIVA PLUS) test strip USE TO TEST BLOOD SUGAR AS DIRECTED 100 each 3  . InFLIXimab (REMICADE IV) Inject into the vein every 8 (eight) weeks.     . metFORMIN (GLUCOPHAGE-XR) 750 MG 24 hr tablet Take 2 tablets (1,500 mg total) by mouth daily with breakfast. 60 tablet 5  . niacin 500 MG tablet Take 500 mg by mouth daily. Take one tab once daily     . nitroGLYCERIN (NITROSTAT) 0.4 MG SL tablet Place 0.4 mg under the tongue every 5 (five) minutes as needed for chest pain. Up to three time and then call the doctor    . simvastatin (ZOCOR) 20 MG tablet Take 1 tablet (20 mg total) by mouth at bedtime. 100 tablet 3  . sulfamethoxazole-trimethoprim (BACTRIM DS,SEPTRA DS) 800-160 MG tablet Take 1 tablet by mouth every Monday, Wednesday, and Friday.  5  . tadalafil (CIALIS) 20 MG tablet Take 0.5-1 tablets (10-20 mg total) by mouth every 3 (three) days as needed for erectile dysfunction. 10 tablet 11  . traMADol (ULTRAM) 50 MG tablet Take 1 tablet (50 mg total) by mouth 2 (two) times daily as needed. 60 tablet 5  . valACYclovir (VALTREX) 500 MG tablet Take 1 tablet (500 mg total) by mouth as needed. (Patient taking differently: Take 500 mg by mouth 3 (three) times daily. ) 60 tablet 3  No current facility-administered medications for this visit.     Objective: BP 122/74 (BP Location: Left Arm, Patient Position: Sitting, Cuff Size: Large)   Pulse 96   Temp 98.1 F (36.7 C) (Oral)   Ht 5' 11.25" (1.81 m)   Wt 178 lb 12.8 oz (81.1 kg)   SpO2 97%   BMI 24.76 kg/m  Gen: NAD, resting comfortably CV: RRR no murmurs rubs or gallops. HR normal range on repeat Lungs: CTAB no crackles, wheeze, rhonchi Abdomen: soft/nontender/nondistended/normal bowel sounds. Normal weight Ext: no edema Skin: warm, dry  Assessment/Plan:  Essential hypertension S: controlled on amlodipine 5mg  (reduced from 10mg  last visit).  BP Readings from Last 3 Encounters:  08/28/16 122/74  08/13/16 130/80  07/02/16 118/70  A/P:  We discussed blood pressure goal of <140/90. Continue current meds:  bp similar to last visit but admits to less lightheadedness so will continue half tablet   Depression S: PHQ9 of 10 last visit up from prior 7-9 despite wlelbutrin 150mg  BID and zoloft 150mg . Was feeling lightheaded with higher dose with standing and seemed to be getting worse and memory felt foggy. We titrated off zoloft and added pristq  Today, PHQ9 of 6. Does not have memory fog and lightheadedness is better A/P: will continue pristiq 50mg  and wellbutrin 150mg  BID and follow up in 1 month. No SI  Diabetes mellitus without complication ([facility]) S: increased metformin to 1500mg  last visit with CBGs after meals often over 200. Baseline fasting was creeping up some too. #s fasting are similar so far with the change to 1500mg  Lab Results  Component Value Date   HGBA1C 7.1 08/28/2016  A/P: a1c up to 7.1 but only 2 weeks of increased metformin. Will continue current dose and follow up 14 weeks  1 month depression check. 14 week diabetes recheck  Orders Placed This Encounter  Procedures  . POCT glycosylated hemoglobin (Hb A1C)   Refilled tramadol for chronic pain- history psoriatic arthritis. Has really been helping him.  Meds ordered this encounter  Medications  . traMADol (ULTRAM) 50 MG tablet    Sig: Take 1 tablet (50 mg total) by mouth 2 (two) times daily as needed.    Dispense:  60 tablet    Refill:  5    Return precautions advised.  Garret Reddish, MD

## 2016-08-28 NOTE — Patient Instructions (Addendum)
Lab Results  Component Value Date   HGBA1C 7.1 08/28/2016  above goal of 7 but only slightly and just went up on metformin. Lets repeat in 14 weeks to see if you are staying under 7  Continue current pristiq and wellbutrin. See me back in 4 weeks

## 2016-08-28 NOTE — Assessment & Plan Note (Signed)
S: controlled on amlodipine 5mg  (reduced from 10mg  last visit).  BP Readings from Last 3 Encounters:  08/28/16 122/74  08/13/16 130/80  07/02/16 118/70  A/P: We discussed blood pressure goal of <140/90. Continue current meds:  bp similar to last visit but admits to less lightheadedness so will continue half tablet

## 2016-09-17 ENCOUNTER — Other Ambulatory Visit: Payer: Self-pay | Admitting: Family Medicine

## 2016-09-17 DIAGNOSIS — F32A Depression, unspecified: Secondary | ICD-10-CM

## 2016-09-17 DIAGNOSIS — F329 Major depressive disorder, single episode, unspecified: Secondary | ICD-10-CM

## 2016-09-17 MED ORDER — BUPROPION HCL ER (SR) 150 MG PO TB12: 150.0000 mg | ORAL_TABLET | Freq: Two times a day (BID) | ORAL | 3 refills | Status: DC

## 2016-09-27 ENCOUNTER — Ambulatory Visit (INDEPENDENT_AMBULATORY_CARE_PROVIDER_SITE_OTHER): Payer: Medicare Other | Admitting: Family Medicine

## 2016-09-27 ENCOUNTER — Encounter: Payer: Self-pay | Admitting: Family Medicine

## 2016-09-27 DIAGNOSIS — F331 Major depressive disorder, recurrent, moderate: Secondary | ICD-10-CM

## 2016-09-27 DIAGNOSIS — F528 Other sexual dysfunction not due to a substance or known physiological condition: Secondary | ICD-10-CM | POA: Diagnosis not present

## 2016-09-27 DIAGNOSIS — E119 Type 2 diabetes mellitus without complications: Secondary | ICD-10-CM | POA: Diagnosis not present

## 2016-09-27 MED ORDER — DESVENLAFAXINE SUCCINATE ER 100 MG PO TB24: 100.0000 mg | ORAL_TABLET | Freq: Every day | ORAL | 5 refills | Status: DC

## 2016-09-27 MED ORDER — TADALAFIL 20 MG PO TABS: 10.0000 mg | ORAL_TABLET | ORAL | 11 refills | Status: DC | PRN

## 2016-09-27 NOTE — Assessment & Plan Note (Addendum)
S: PHQ9 of 6 last viist (peak of 10) on wellbutrin 150mg  BID and pristiq 50mg but had only been a few weeks and we agreed to continue current dose.   Today remains on pristiq 50mg  and wellbutrin 150mg  BID and PHQ9 of 4 (rates 1.5 for questions 1 and 2). He states he had a few weeks where he did not feel depressed at all then symptoms returned but not as severe as any other prior med combo A/P: this is the best his symptoms have been (PHQ9 of 4). titrate pristiq to 100mg  and continue wellbutrin. Warned of serotonin syndrome risks especially on tramadol BID as well

## 2016-09-27 NOTE — Patient Instructions (Addendum)
Some risk of serotonin syndrome with pristiq combined with tramadol though I doubt this will occur  Increase pristiq to 100mg   I like the metformin 2000mg  a aday  Follow up 10 weeks to recheck both  See [place] sooner for worsening depression or intolerable side effects with medication changes  If you have taken cialis within 24 hours cannot take nitroglycerin- and vice versa- if you had chest pain would need to go to hospital  Serotonin Syndrome Serotonin is a brain chemical that regulates the nervous system, which includes the brain, spinal cord, and nerves. Serotonin appears to play a role in all types of behavior, including appetite, emotions, movement, thinking, and response to stress. Excessively high levels of serotonin in the body can cause serotonin syndrome, which is a very dangerous condition. What are the causes? This condition can be caused by taking medicines or drugs that increase the level of serotonin in your body. These include:  Antidepressant medicines.  Migraine medicines.  Certain pain medicines.  Certain recreational drugs, including ecstasy, LSD, cocaine, and amphetamines.  Over-the-counter cough or cold medicines that contain dextromethorphan.  Certain herbal supplements, including St. John's wort, ginseng, and nutmeg.  This condition usually occurs when you take these medicines or drugs in combination, but it can also happen with a high dose of a single medicine or drug. What increases the risk? This condition is more likely to develop in:  People who have recently increased the dosage of medicine that increases the serotonin level.  People who just started taking medicine that increases the serotonin level.  What are the signs or symptoms? Symptoms of this condition usually happens within several hours of a medicine change. Symptoms include:  Headache.  Muscle twitching or stiffness.  Diarrhea.  Confusion.  Restlessness or agitation.  Shivering  or goose bumps.  Loss of muscle coordination.  Rapid heart rate.  Sweating.  Severe cases of serotonin syndromecan cause:  Irregular heartbeat.  Seizures.  Loss of consciousness.  High fever.  How is this diagnosed? This condition is diagnosed with a medical history and physical exam. You will be asked aboutyour symptoms and your use of medicines and recreational drugs. Your health care provider may also order lab work or additional tests to rule out other causes of your symptoms. How is this treated? The treatment for this condition depends on the severity of your symptoms. For mild cases, stopping the medicine that caused your condition is usually all that is needed. For moderate to severe cases, hospitalization is required to monitor you and to prevent further muscle damage. Follow these instructions at home:  Take over-the-counter and prescription medicines only as told by your health care provider. This is important.  Check with your health care provider before you start taking any new prescriptions, over-the-counter medicines, herbs, or supplements.  Avoid combining any medicines that can cause this condition to occur.  Keep all follow-up visits as told by your health care provider.This is important.  Maintain a healthy lifestyle. ? Eat healthy foods. ? Get plenty of sleep. ? Exercise regularly. ? Do not drink alcohol. ? Do not use recreational drugs. Contact a health care provider if:  Medicines do not seem to be helping.  Your symptoms do not improve or they get worse.  You have trouble taking care of yourself. Get help right away if:  You have worsening confusion, severe headache, chest pain, high fever, seizures, or loss of consciousness.  You have serious thoughts about hurting yourself or others.  You experience serious side effects of medicine, such as swelling of your face, lips, tongue, or throat. This information is not intended to replace advice  given to you by your health care provider. Make sure you discuss any questions you have with your health care provider. Document Released: 04/04/2004 Document Revised: 10/21/2015 Document Reviewed: 03/10/2014 Elsevier Interactive Patient Education  [employer].

## 2016-09-27 NOTE — Assessment & Plan Note (Signed)
S: went up on his metformin 1500mg  XR daily to 2000mg  as noted several sugars above 170 even into 180s. Has started in last few days to see decrease even having one in the AM of 146  Walking at mall 40 minutes everyday. Watching diet- his wife is watching A/P: continue 2g daily of metformin XR and follow up 10 weeks with repeat a1c POC at minimum. Has had some stomach upset with this- hoping he can tolerate- could add amaryl

## 2016-09-27 NOTE — Progress Notes (Signed)
Subjective:  Chase Anderson is a 71 y.o. year old very pleasant male patient who presents for/with See problem oriented charting ROS- No chest pain or shortness of breath. No headache or blurry vision. Occasional sweating when out in heat.    Past Medical History-  Patient Active Problem List   Diagnosis Date Noted  . Thrombocytosis ([place]) 04/03/2016    Priority: High  . Depression 03/27/2015    Priority: High  . Psoriatic arthritis ([place]) 01/25/2013    Priority: High  . Mitral valve prolapse 12/25/2010    Priority: High  . Leukocytopenia 11/24/2006    Priority: High  . Diabetes mellitus without complication ([place]) 53/61/4431    Priority: High  . CAD (coronary artery disease) with history MI 2004- no intervention 10/06/2006    Priority: High  . Dyshidrotic eczema 02/07/2016    Priority: Medium  . CKD (chronic kidney disease), stage III 09/15/2015    Priority: Medium  . History of BPH 12/14/2009    Priority: Medium  . Hyperlipidemia 10/06/2006    Priority: Medium  . Essential hypertension 10/06/2006    Priority: Medium  . ERECTILE DYSFUNCTION 11/26/2007    Priority: Low    Medications- reviewed and updated Current Outpatient Prescriptions  Medication Sig Dispense Refill  . amLODipine (NORVASC) 10 MG tablet Take 1 tablet (10 mg total) by mouth daily. 100 tablet 3  . aspirin EC 81 MG tablet Take 81 mg by mouth daily.    . BD ULTRA-FINE LANCETS lancets Use once daily for glucose control 100 each 3  . buPROPion (WELLBUTRIN SR) 150 MG 12 hr tablet Take 1 tablet (150 mg total) by mouth 2 (two) times daily. 180 tablet 3  . clindamycin (CLEOCIN) 300 MG capsule Take 300 mg by mouth 3 (three) times daily.    Marland Kitchen desvenlafaxine (PRISTIQ) 50 MG 24 hr tablet Take 1 tablet (50 mg total) by mouth daily. 30 tablet 3  . fluconazole (DIFLUCAN) 100 MG tablet Take 100 mg by mouth daily as needed.     . fluocinonide ointment (LIDEX) 0.05 % APPLY TOPICALLY ONCE DAILY AS NEEDED 60 g 3  . glucose  blood (ACCU-CHEK AVIVA PLUS) test strip USE TO TEST BLOOD SUGAR AS DIRECTED 100 each 3  . InFLIXimab (REMICADE IV) Inject into the vein every 8 (eight) weeks.     . metFORMIN (GLUCOPHAGE-XR) 750 MG 24 hr tablet Take 2 tablets (1,500 mg total) by mouth daily with breakfast. 60 tablet 5  . niacin 500 MG tablet Take 500 mg by mouth daily. Take one tab once daily     . nitroGLYCERIN (NITROSTAT) 0.4 MG SL tablet Place 0.4 mg under the tongue every 5 (five) minutes as needed for chest pain. Up to three time and then call the doctor    . simvastatin (ZOCOR) 20 MG tablet Take 1 tablet (20 mg total) by mouth at bedtime. 100 tablet 3  . sulfamethoxazole-trimethoprim (BACTRIM DS,SEPTRA DS) 800-160 MG tablet Take 1 tablet by mouth every Monday, Wednesday, and Friday.  5  . tadalafil (CIALIS) 20 MG tablet Take 0.5-1 tablets (10-20 mg total) by mouth every 3 (three) days as needed for erectile dysfunction. 10 tablet 11  . traMADol (ULTRAM) 50 MG tablet Take 1 tablet (50 mg total) by mouth 2 (two) times daily as needed. 60 tablet 5  . valACYclovir (VALTREX) 500 MG tablet Take 1 tablet (500 mg total) by mouth as needed. (Patient taking differently: Take 500 mg by mouth 3 (three) times daily. ) 60  tablet 3   No current facility-administered medications for this visit.     Objective: BP 108/72 (BP Location: Left Arm, Patient Position: Sitting, Cuff Size: Large)   Pulse 97   Temp 97.9 F (36.6 C) (Oral)   Ht 5' 11.25" (1.81 m)   Wt 179 lb 6.4 oz (81.4 kg)   SpO2 97%   BMI 24.85 kg/m  Gen: NAD, resting comfortably CV: RRR no murmurs rubs or gallops Lungs: CTAB no crackles, wheeze, rhonchi Ext: no edema Skin: warm, dry  Assessment/Plan:  Depression S: PHQ9 of 6 last viist (peak of 10) on wellbutrin 150mg  BID and pristiq 50mg but had only been a few weeks and we agreed to continue current dose.   Today remains on pristiq 50mg  and wellbutrin 150mg  BID and PHQ9 of 4 (rates 1.5 for questions 1 and 2). He  states he had a few weeks where he did not feel depressed at all then symptoms returned but not as severe as any other prior med combo A/P: this is the best his symptoms have been (PHQ9 of 4). titrate pristiq to 100mg  and continue wellbutrin. Warned of serotonin syndrome risks especially on tramadol BID as well  Diabetes mellitus without complication ([place]) S: went up on his metformin 1500mg  XR daily to 2000mg  as noted several sugars above 170 even into 180s. Has started in last few days to see decrease even having one in the AM of 146  Walking at mall 40 minutes everyday. Watching diet- his wife is watching A/P: continue 2g daily of metformin XR and follow up 10 weeks with repeat a1c POC at minimum. Has had some stomach upset with this- hoping he can tolerate- could add amaryl  ERECTILE DYSFUNCTION S: would like refill of cialis. Chest pain free and not using nitroglycerin A/P: warned him strongly against combining cialis and nitroglycerin. He is aware. Will refill cialis.    Return in about 10 weeks (around 12/06/2016).  Meds ordered this encounter  Medications  . desvenlafaxine (PRISTIQ) 100 MG 24 hr tablet    Sig: Take 1 tablet (100 mg total) by mouth daily.    Dispense:  30 tablet    Refill:  5  . tadalafil (CIALIS) 20 MG tablet    Sig: Take 0.5-1 tablets (10-20 mg total) by mouth every 3 (three) days as needed for erectile dysfunction.    Dispense:  10 tablet    Refill:  11    Return precautions advised.  Garret Reddish, MD

## 2016-09-27 NOTE — Assessment & Plan Note (Signed)
S: would like refill of cialis. Chest pain free and not using nitroglycerin A/P: warned him strongly against combining cialis and nitroglycerin. He is aware. Will refill cialis.

## 2016-09-30 ENCOUNTER — Encounter: Payer: Self-pay | Admitting: Family Medicine

## 2016-09-30 ENCOUNTER — Telehealth: Payer: Self-pay

## 2016-09-30 NOTE — Telephone Encounter (Signed)
Received PA request for Cialis. PA submitted & is pending. Key: [facility]

## 2016-09-30 NOTE — Telephone Encounter (Signed)
I sent him a my chart message.

## 2016-09-30 NOTE — Telephone Encounter (Signed)
PA denied, medication excluded from medicare coverage.

## 2016-10-15 LAB — HM DIABETES EYE EXAM

## 2016-10-24 ENCOUNTER — Encounter: Payer: Self-pay | Admitting: Family Medicine

## 2016-10-29 NOTE — Progress Notes (Signed)
Subjective:   Chase Anderson is a 71 y.o. male who presents for Medicare Annual/Subsequent preventive examination.  The Patient was informed that the wellness visit is to identify future health risk and educate and initiate measures that can reduce risk for increased disease through the lifespan.    Annual Wellness Assessment  Reports health as   Preventive Screening -Counseling & Management  Medicare Annual Preventive Care Visit - Subsequent Last OV 09/27/2016 Need to repeat depression scale today  Colonoscopy 07/2008 repeat 07/2018 Smoked until 2003; quit approx 71 yo  Educated LDCT  Had ct of the abd; states 2 to 3 times; will consider this sufficient for the AAA check   Describes Health as poor, fair, good or great? Good   Just had shingles again; hx x 7   VS reviewed;   Diet  Feels he eats well Breakfast - cereal; small amount; pancake or egg;  Lunch; sandwich  Supper wife watches what she cooks but has meal at hs Snack at bedtime sometimes - no  BMI 25  Exercise reported to Dr. Yong Channel he was walking 40 minutes every day. Also stated he was watching his diet. Walks 2 miles a day and plays gold x 2 per week  States he is very active   Stressors: Dr. Yong Channel following depression which appears to be responding to his medical treatment plan.  Sleep patterns:sleeps well    Pain; meds help with pain  Depression hx; states it is hard to get started  Fatigue started when he had his MI  Also states blood work may precipitate fatigue  t cell abnormality / going to dr. Frutoso Chase at [facility] ( she is ID MD)  Depression; declined today but no other issues since depression scale x 1 month c/'o of fatigue but managing Very engaging today; states med is helping him     Cardiac Risk Factors Addressed Hyperlipidemia - chol;hdl ratio 3; chol 108; hdl 39; LDL 50 and trig 90 Diabetes patient's metformin was increased at last office visit is checking his blood sugars  compliantly  A1c  7.1 Obesity 25   Given information;  Advanced Directive; Reviewed advanced directive and agreed to receipt of information and discussion.  Focused face to face x  20 minutes discussing HCPOA and Living will and reviewed all the questions in the [place] forms. The patient voices understanding of HCPOA; LW reviewed and information provided on each question. Educated on how to revoke this HCPOA or LW at any time.   Also  discussed life prolonging measures (given a few examples) and where he could choose to initiate or not;  the ability to given the HCPOA power to change his living will or not if he cannot speak for himself; as well as finalizing the will by 2 unrelated witnesses and notary.  Will call for questions and given information on [facility] pastoral department for further assistance.      Patient Care Team: Marin Olp, MD as PCP - General (Family Medicine) Unice Bailey, MD as Consulting Physician (Rheumatology) Assessed for additional providers  Immunization History  Administered Date(s) Administered  . Influenza Split 01/23/2012, 11/19/2012  . Influenza Whole 11/26/2007, 12/14/2009  . Influenza, High Dose Seasonal PF 02/01/2014, 02/06/2015, 02/07/2016  . Influenza, Seasonal, Injecte, Preservative Fre 01/05/2009  . Influenza,inj,Quad PF,6+ Mos 11/19/2012  . Influenza-Unspecified 01/05/2009  . Pneumococcal Conjugate-13 06/02/2013  . Pneumococcal Polysaccharide-23 03/11/2004, 06/29/2009  . Td 03/11/2002  . Tetanus 03/10/2013   Required Immunizations needed  today  Screening test up to date or reviewed for plan of completion Health Maintenance Due  Topic Date Due  . INFLUENZA VACCINE  10/09/2016    Keep in mind the flu shot is an inactivated vaccine and takes at least 2 weeks to build immunity. The flu virus can be dormant for 4 days prior to symptoms Taking the flu shot at the beginning of the season can reduce the risk for the entire  community.            Objective:    Vitals: BP 120/60   Pulse 96   Ht 5\' 11"  (1.803 m)   Wt 180 lb (81.6 kg)   SpO2 96%   BMI 25.10 kg/m   Body mass index is 25.1 kg/m.  Tobacco History  Smoking Status  . Former Smoker  . Packs/day: 2.00  . Years: 30.00  . Types: Cigarettes  . Quit date: 12/09/2001  Smokeless Tobacco  . Former Systems developer  . Types: Chew  . Quit date: 03/11/2002     Counseling given: Yes   Past Medical History:  Diagnosis Date  . Allergic rhinitis   . Anxiety   . CAD (coronary artery disease)   . Collagen vascular disease ([place])   . Dermatitis    other atopic  . Diabetes mellitus    type II  . Erectile dysfunction   . Fatigue   . Hyperlipidemia   . Hypertension   . Leukopenia   . Myocardial infarction ([place])   . Osteoarthritis    Past Surgical History:  Procedure Laterality Date  . ESOPHAGOGASTRODUODENOSCOPY  04/23/2005  . KNEE ARTHROSCOPY     right  . TONSILLECTOMY     No family history on file. History  Sexual Activity  . Sexual activity: Not on file    Outpatient Encounter Prescriptions as of 10/30/2016  Medication Sig  . amLODipine (NORVASC) 10 MG tablet Take 1 tablet (10 mg total) by mouth daily.  Marland Kitchen aspirin EC 81 MG tablet Take 81 mg by mouth daily.  . BD ULTRA-FINE LANCETS lancets Use once daily for glucose control  . buPROPion (WELLBUTRIN SR) 150 MG 12 hr tablet Take 1 tablet (150 mg total) by mouth 2 (two) times daily.  . clindamycin (CLEOCIN) 300 MG capsule Take 300 mg by mouth 3 (three) times daily.  Marland Kitchen desvenlafaxine (PRISTIQ) 100 MG 24 hr tablet Take 1 tablet (100 mg total) by mouth daily.  . fluconazole (DIFLUCAN) 100 MG tablet Take 100 mg by mouth daily as needed.   . fluocinonide ointment (LIDEX) 0.05 % APPLY TOPICALLY ONCE DAILY AS NEEDED  . glucose blood (ACCU-CHEK AVIVA PLUS) test strip USE TO TEST BLOOD SUGAR AS DIRECTED  . InFLIXimab (REMICADE IV) Inject into the vein every 8 (eight) weeks.   . metFORMIN  (GLUCOPHAGE-XR) 750 MG 24 hr tablet Take 2 tablets (1,500 mg total) by mouth daily with breakfast.  . niacin 500 MG tablet Take 500 mg by mouth daily. Take one tab once daily   . nitroGLYCERIN (NITROSTAT) 0.4 MG SL tablet Place 0.4 mg under the tongue every 5 (five) minutes as needed for chest pain. Up to three time and then call the doctor  . simvastatin (ZOCOR) 20 MG tablet Take 1 tablet (20 mg total) by mouth at bedtime.  . sulfamethoxazole-trimethoprim (BACTRIM DS,SEPTRA DS) 800-160 MG tablet Take 1 tablet by mouth every Monday, Wednesday, and Friday.  . tadalafil (CIALIS) 20 MG tablet Take 0.5-1 tablets (10-20 mg total) by mouth every 3 (three)  days as needed for erectile dysfunction.  . traMADol (ULTRAM) 50 MG tablet Take 1 tablet (50 mg total) by mouth 2 (two) times daily as needed.  . valACYclovir (VALTREX) 500 MG tablet Take 1 tablet (500 mg total) by mouth as needed. (Patient taking differently: Take 500 mg by mouth 3 (three) times daily. )   No facility-administered encounter medications on file as of 10/30/2016.     Activities of Daily Living No flowsheet data found.  Patient Care Team: Marin Olp, MD as PCP - General (Family Medicine) Unice Bailey, MD as Consulting Physician (Rheumatology)   Assessment:     Exercise Activities and Dietary recommendations    Goals    . Exercise 150 minutes per week (moderate activity)          Will continue to exercise x 4 days a week       Fall Risk Fall Risk  09/27/2016 09/15/2015 02/06/2015 02/01/2014 01/25/2013  Falls in the past year? Yes No No No No  Number falls in past yr: 1 - - - -  Injury with Fall? No - - - -   Depression Screen PHQ 2/9 Scores 09/27/2016 09/27/2016 09/15/2015 02/06/2015  PHQ - 2 Score 3 2 0 0  PHQ- 9 Score 4 - - -    Cognitive Function Ad8 score reviewed for issues:  Issues making decisions:  Less interest in hobbies / activities:  Repeats questions, stories (family complaining):  Trouble  using ordinary gadgets (microwave, computer, phone):  Forgets the month or year:   Mismanaging finances:   Remembering appts:  Daily problems with thinking and/or memory: Ad8 score is=0 Has worked with people who did have memory loss and parents had memory loss          Immunization History  Administered Date(s) Administered  . Influenza Split 01/23/2012, 11/19/2012  . Influenza Whole 11/26/2007, 12/14/2009  . Influenza, High Dose Seasonal PF 02/01/2014, 02/06/2015, 02/07/2016  . Influenza, Seasonal, Injecte, Preservative Fre 01/05/2009  . Influenza,inj,Quad PF,6+ Mos 11/19/2012  . Influenza-Unspecified 01/05/2009  . Pneumococcal Conjugate-13 06/02/2013  . Pneumococcal Polysaccharide-23 03/11/2004, 06/29/2009  . Td 03/11/2002  . Tetanus 03/10/2013   Screening Tests Health Maintenance  Topic Date Due  . INFLUENZA VACCINE  10/09/2016  . URINE MICROALBUMIN  02/06/2017  . HEMOGLOBIN A1C  02/27/2017  . FOOT EXAM  05/20/2017  . OPHTHALMOLOGY EXAM  10/15/2017  . COLONOSCOPY  07/15/2018  . TETANUS/TDAP  03/11/2023  . Hepatitis C Screening  Completed  . PNA vac Low Risk Adult  Addressed      Plan:     PCP Notes   Health Maintenance States he will discussed taking the shingrix with his infectious disease doctor.  Abnormal Screens  States his a.m. blood sugars have been trending around 150-160. Has a scheduled appointment in September to discuss with Dr. Yong Channel.  States his depression has lifted with the medication. Declined depression scale today but only complains of fatigue in the morning when he is getting up. Once he is up he starts to feel better.  Referrals  No referrals today. Given information about a hearing screen if he chooses to have one. No issues noted in our conversation today.  Patient concerns; Patient again voiced concern over his diabetes but will discuss that with Dr. Yong Channel. Also is concerned over his ongoing cases of shingles. He does take  Valtrex when he feels the case on results.  Nurse Concerns; No particular concerns. Diet adequate. Exercise adequate.  Next PCP apt  Will discuss shingrix at the next visit States his fasting bs have been 160 to 170 and this am bs 150 Lowest has been 138       I have personally reviewed and noted the following in the patient's chart:   . Medical and social history . Use of alcohol, tobacco or illicit drugs  . Current medications and supplements . Functional ability and status . Nutritional status . Physical activity . Advanced directives . List of other physicians . Hospitalizations, surgeries, and ER visits in previous 12 months . Vitals . Screenings to include cognitive, depression, and falls . Referrals and appointments  In addition, I have reviewed and discussed with patient certain preventive protocols, quality metrics, and best practice recommendations. A written personalized care plan for preventive services as well as general preventive health recommendations were provided to patient.     Wynetta Fines, RN  10/30/2016

## 2016-10-30 ENCOUNTER — Ambulatory Visit (INDEPENDENT_AMBULATORY_CARE_PROVIDER_SITE_OTHER): Payer: Medicare Other

## 2016-10-30 VITALS — BP 120/60 | HR 96 | Ht 71.0 in | Wt 180.0 lb

## 2016-10-30 DIAGNOSIS — Z0000 Encounter for general adult medical examination without abnormal findings: Secondary | ICD-10-CM

## 2016-10-30 NOTE — Patient Instructions (Addendum)
Chase Anderson , Thank you for taking time to come for your Medicare Wellness Visit. I appreciate your ongoing commitment to your health goals. Please review the following plan we discussed and let me know if I can assist you in the future.   Shingles type pain and took valtrex and the pain was gone Will discuss taking the shingles vaccination with Dr. Yong Channel;  Shingrix is a vaccine for the prevention of Shingles in Adults 50 and older.  If you are on Medicare, you can request a prescription from your doctor to be filled at a pharmacy.  Please check with your benefits regarding applicable copays or out of pocket expenses.  The Shingrix is given in 2 vaccines approx 8 weeks apart. You must receive the 2nd dose prior to 6 months from receipt of the first.   Keep in mind the flu shot is an inactivated vaccine and takes at least 2 weeks to build immunity. The flu virus can be dormant for 4 days prior to symptoms Taking the flu shot at the beginning of the season can reduce the risk for the entire community.   Deaf & Hard of Hearing Division Services - can assist with hearing aid x 1  No reviews  [address] Office  [address]  [phone]      These are the goals we discussed: Goals    None      This is a list of the screening recommended for you and due dates:  Health Maintenance  Topic Date Due  . Flu Shot  10/09/2016  . Urine Protein Check  02/06/2017  . Hemoglobin A1C  02/27/2017  . Complete foot exam   05/20/2017  . Eye exam for diabetics  10/15/2017  . Colon Cancer Screening  07/15/2018  . Tetanus Vaccine  03/11/2023  .  Hepatitis C: One time screening is recommended by Center for Disease Control  (CDC) for  adults born from [birth year] through 1965.   Completed  . Pneumonia vaccines  Addressed    Prevention of falls: Remove rugs or any tripping hazards in the home Use Non slip mats in bathtubs and showers Placing grab bars next to the toilet and or  shower Placing handrails on both sides of the stair way Adding extra lighting in the home.   Personal safety issues reviewed:  1. Consider starting a community watch program per [facility] 2.  Changes batteries is smoke detector and/or carbon monoxide detector  3.  If you have firearms; keep them in a safe place 4.  Wear protection when in the sun; Always wear sunscreen or a hat; It is good to have your doctor check your skin annually or review any new areas of concern 5. Driving safety; Keep in the right lane; stay 3 car lengths behind the car in front of you on the highway; look 3 times prior to pulling out; carry your cell phone everywhere you go!    Learn about the Yellow Dot program:  The program allows first responders at your emergency to have access to who your physician is, as well as your medications and medical conditions.  Citizens requesting the Yellow Dot Packages should contact Master Corporal Nunzio Cobbs at the [facility] [phone] for the first week of the program and beginning the week after Easter citizens should contact their Scientist, physiological.     Health Maintenance, Male A healthy lifestyle and preventive care is important for your health  and wellness. Ask your health care provider about what schedule of regular examinations is right for you. What should I know about weight and diet? Eat a Healthy Diet  Eat plenty of vegetables, fruits, whole grains, low-fat dairy products, and lean protein.  Do not eat a lot of foods high in solid fats, added sugars, or salt.  Maintain a Healthy Weight Regular exercise can help you achieve or maintain a healthy weight. You should:  Do at least 150 minutes of exercise each week. The exercise should increase your heart rate and make you sweat (moderate-intensity exercise).  Do strength-training exercises at least twice a week.  Watch Your Levels of Cholesterol and Blood  Lipids  Have your blood tested for lipids and cholesterol every 5 years starting at 72 years of age. If you are at high risk for heart disease, you should start having your blood tested when you are 72 years old. You may need to have your cholesterol levels checked more often if: ? Your lipid or cholesterol levels are high. ? You are older than 72 years of age. ? You are at high risk for heart disease.  What should I know about cancer screening? Many types of cancers can be detected early and may often be prevented. Lung Cancer  You should be screened every year for lung cancer if: ? You are a current smoker who has smoked for at least 30 years. ? You are a former smoker who has quit within the past 15 years.  Talk to your health care provider about your screening options, when you should start screening, and how often you should be screened.  Colorectal Cancer  Routine colorectal cancer screening usually begins at 72 years of age and should be repeated every 5-10 years until you are 72 years old. You may need to be screened more often if early forms of precancerous polyps or small growths are found. Your health care provider may recommend screening at an earlier age if you have risk factors for colon cancer.  Your health care provider may recommend using home test kits to check for hidden blood in the stool.  A small camera at the end of a tube can be used to examine your colon (sigmoidoscopy or colonoscopy). This checks for the earliest forms of colorectal cancer.  Prostate and Testicular Cancer  Depending on your age and overall health, your health care provider may do certain tests to screen for prostate and testicular cancer.  Talk to your health care provider about any symptoms or concerns you have about testicular or prostate cancer.  Skin Cancer  Check your skin from head to toe regularly.  Tell your health care provider about any new moles or changes in moles, especially  if: ? There is a change in a mole's size, shape, or color. ? You have a mole that is larger than a pencil eraser.  Always use sunscreen. Apply sunscreen liberally and repeat throughout the day.  Protect yourself by wearing long sleeves, pants, a wide-brimmed hat, and sunglasses when outside.  What should I know about heart disease, diabetes, and high blood pressure?  If you are 84-9 years of age, have your blood pressure checked every 3-5 years. If you are 65 years of age or older, have your blood pressure checked every year. You should have your blood pressure measured twice-once when you are at a hospital or clinic, and once when you are not at a hospital or clinic. Record the average of the  two measurements. To check your blood pressure when you are not at a hospital or clinic, you can use: ? An automated blood pressure machine at a pharmacy. ? A home blood pressure monitor.  Talk to your health care provider about your target blood pressure.  If you are between 19-75 years old, ask your health care provider if you should take aspirin to prevent heart disease.  Have regular diabetes screenings by checking your fasting blood sugar level. ? If you are at a normal weight and have a low risk for diabetes, have this test once every three years after the age of 41. ? If you are overweight and have a high risk for diabetes, consider being tested at a younger age or more often.  A one-time screening for abdominal aortic aneurysm (AAA) by ultrasound is recommended for men aged 74-75 years who are current or former smokers. What should I know about preventing infection? Hepatitis B If you have a higher risk for hepatitis B, you should be screened for this virus. Talk with your health care provider to find out if you are at risk for hepatitis B infection. Hepatitis C Blood testing is recommended for:  Everyone born from [birth year] through 1966.  Anyone with known risk factors for hepatitis  C.  Sexually Transmitted Diseases (STDs)  You should be screened each year for STDs including gonorrhea and chlamydia if: ? You are sexually active and are younger than 72 years of age. ? You are older than 72 years of age and your health care provider tells you that you are at risk for this type of infection. ? Your sexual activity has changed since you were last screened and you are at an increased risk for chlamydia or gonorrhea. Ask your health care provider if you are at risk.  Talk with your health care provider about whether you are at high risk of being infected with HIV. Your health care provider may recommend a prescription medicine to help prevent HIV infection.  What else can I do?  Schedule regular health, dental, and eye exams.  Stay current with your vaccines (immunizations).  Do not use any tobacco products, such as cigarettes, chewing tobacco, and e-cigarettes. If you need help quitting, ask your health care provider.  Limit alcohol intake to no more than 2 drinks per day. One drink equals 12 ounces of beer, 5 ounces of wine, or 1 ounces of hard liquor.  Do not use street drugs.  Do not share needles.  Ask your health care provider for help if you need support or information about quitting drugs.  Tell your health care provider if you often feel depressed.  Tell your health care provider if you have ever been abused or do not feel safe at home. This information is not intended to replace advice given to you by your health care provider. Make sure you discuss any questions you have with your health care provider. Document Released: 08/24/2007 Document Revised: 10/25/2015 Document Reviewed: 11/29/2014 Elsevier Interactive Patient Education  2018 [place] in the Home Falls can cause injuries and can affect people from all age groups. There are many simple things that you can do to make your home safe and to help prevent falls. What can I do on  the outside of my home?  Regularly repair the edges of walkways and driveways and fix any cracks.  Remove high doorway thresholds.  Trim any shrubbery on the main path into your home.  Use  bright outdoor lighting.  Clear walkways of debris and clutter, including tools and rocks.  Regularly check that handrails are securely fastened and in good repair. Both sides of any steps should have handrails.  Install guardrails along the edges of any raised decks or porches.  Have leaves, snow, and ice cleared regularly.  Use sand or salt on walkways during winter months.  In the garage, clean up any spills right away, including grease or oil spills. What can I do in the bathroom?  Use night lights.  Install grab bars by the toilet and in the tub and shower. Do not use towel bars as grab bars.  Use non-skid mats or decals on the floor of the tub or shower.  If you need to sit down while you are in the shower, use a plastic, non-slip stool.  Keep the floor dry. Immediately clean up any water that spills on the floor.  Remove soap buildup in the tub or shower on a regular basis.  Attach bath mats securely with double-sided non-slip rug tape.  Remove throw rugs and other tripping hazards from the floor. What can I do in the bedroom?  Use night lights.  Make sure that a bedside light is easy to reach.  Do not use oversized bedding that drapes onto the floor.  Have a firm chair that has side arms to use for getting dressed.  Remove throw rugs and other tripping hazards from the floor. What can I do in the kitchen?  Clean up any spills right away.  Avoid walking on wet floors.  Place frequently used items in easy-to-reach places.  If you need to reach for something above you, use a sturdy step stool that has a grab bar.  Keep electrical cables out of the way.  Do not use floor polish or wax that makes floors slippery. If you have to use wax, make sure that it is non-skid  floor wax.  Remove throw rugs and other tripping hazards from the floor. What can I do in the stairways?  Do not leave any items on the stairs.  Make sure that there are handrails on both sides of the stairs. Fix handrails that are broken or loose. Make sure that handrails are as long as the stairways.  Check any carpeting to make sure that it is firmly attached to the stairs. Fix any carpet that is loose or worn.  Avoid having throw rugs at the top or bottom of stairways, or secure the rugs with carpet tape to prevent them from moving.  Make sure that you have a light switch at the top of the stairs and the bottom of the stairs. If you do not have them, have them installed. What are some other fall prevention tips?  Wear closed-toe shoes that fit well and support your feet. Wear shoes that have rubber soles or low heels.  When you use a stepladder, make sure that it is completely opened and that the sides are firmly locked. Have someone hold the ladder while you are using it. Do not climb a closed stepladder.  Add color or contrast paint or tape to grab bars and handrails in your home. Place contrasting color strips on the first and last steps.  Use mobility aids as needed, such as canes, walkers, scooters, and crutches.  Turn on lights if it is dark. Replace any light bulbs that burn out.  Set up furniture so that there are clear paths. Keep the furniture in the same  spot.  Fix any uneven floor surfaces.  Choose a carpet design that does not hide the edge of steps of a stairway.  Be aware of any and all pets.  Review your medicines with your healthcare provider. Some medicines can cause dizziness or changes in blood pressure, which increase your risk of falling. Talk with your health care provider about other ways that you can decrease your risk of falls. This may include working with a physical therapist or trainer to improve your strength, balance, and endurance. This  information is not intended to replace advice given to you by your health care provider. Make sure you discuss any questions you have with your health care provider. Document Released: 02/15/2002 Document Revised: 07/25/2015 Document Reviewed: 04/01/2014 Elsevier Interactive Patient Education  2017 [employer].

## 2016-10-31 NOTE — Progress Notes (Signed)
I have reviewed and agree with note, evaluation, plan. Will follow up on cbgs at next visit. May need increased rx.  Lab Results  Component Value Date   HGBA1C 7.1 08/28/2016   Garret Reddish, MD

## 2016-11-06 ENCOUNTER — Other Ambulatory Visit: Payer: Self-pay | Admitting: Family Medicine

## 2016-11-06 DIAGNOSIS — I10 Essential (primary) hypertension: Secondary | ICD-10-CM

## 2016-12-06 ENCOUNTER — Ambulatory Visit (INDEPENDENT_AMBULATORY_CARE_PROVIDER_SITE_OTHER): Payer: Medicare Other | Admitting: Family Medicine

## 2016-12-06 ENCOUNTER — Encounter: Payer: Self-pay | Admitting: Family Medicine

## 2016-12-06 VITALS — BP 112/68 | HR 102 | Temp 98.5°F | Ht 71.0 in | Wt 179.0 lb

## 2016-12-06 DIAGNOSIS — E118 Type 2 diabetes mellitus with unspecified complications: Secondary | ICD-10-CM

## 2016-12-06 DIAGNOSIS — F330 Major depressive disorder, recurrent, mild: Secondary | ICD-10-CM | POA: Diagnosis not present

## 2016-12-06 DIAGNOSIS — Z23 Encounter for immunization: Secondary | ICD-10-CM

## 2016-12-06 LAB — POCT GLYCOSYLATED HEMOGLOBIN (HGB A1C): HEMOGLOBIN A1C: 7.1

## 2016-12-06 MED ORDER — METFORMIN HCL ER 500 MG PO TB24: 1000.0000 mg | ORAL_TABLET | Freq: Every day | ORAL | 3 refills | Status: DC

## 2016-12-06 MED ORDER — GLIMEPIRIDE 2 MG PO TABS
2.0000 mg | ORAL_TABLET | Freq: Every day | ORAL | 3 refills | Status: DC
Start: 2016-12-06 — End: 2017-11-24

## 2016-12-06 MED ORDER — SILDENAFIL CITRATE 20 MG PO TABS: ORAL_TABLET | ORAL | 3 refills | Status: DC

## 2016-12-06 MED ORDER — DESVENLAFAXINE SUCCINATE ER 50 MG PO TB24: 50.0000 mg | ORAL_TABLET | Freq: Every day | ORAL | 3 refills | Status: DC

## 2016-12-06 NOTE — Progress Notes (Signed)
Subjective:  Chase Anderson is a 71 y.o. year old very pleasant male patient who presents for/with See problem oriented charting ROS- no hypoglycemia. Some loose stools and stool urgency. No chest pain or shortness of breath.    Past Medical History-  Patient Active Problem List   Diagnosis Date Noted  . Thrombocytosis ([place]) 04/03/2016    Priority: High  . Depression 03/27/2015    Priority: High  . Psoriatic arthritis ([place]) 01/25/2013    Priority: High  . Mitral valve prolapse 12/25/2010    Priority: High  . Leukocytopenia 11/24/2006    Priority: High  . 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 CAD (coronary artery disease) with history MI 2004- no intervention 10/06/2006    Priority: High  . Dyshidrotic eczema 02/07/2016    Priority: Medium  . CKD (chronic kidney disease), stage III 09/15/2015    Priority: Medium  . History of BPH 12/14/2009    Priority: Medium  . Hyperlipidemia 10/06/2006    Priority: Medium  . Essential hypertension 10/06/2006    Priority: Medium  . ERECTILE DYSFUNCTION 11/26/2007    Priority: Low    Medications- reviewed and updated Current Outpatient Prescriptions  Medication Sig Dispense Refill  . amLODipine (NORVASC) 10 MG tablet Take 1 tablet (10 mg total) by mouth daily. 100 tablet 3  . aspirin EC 81 MG tablet Take 81 mg by mouth daily.    . BD ULTRA-FINE LANCETS lancets Use once daily for glucose control 100 each 3  . buPROPion (WELLBUTRIN SR) 150 MG 12 hr tablet Take 1 tablet (150 mg total) by mouth 2 (two) times daily. 180 tablet 3  . clindamycin (CLEOCIN) 300 MG capsule Take 300 mg by mouth 3 (three) times daily.    . fluconazole (DIFLUCAN) 100 MG tablet Take 100 mg by mouth daily as needed.     . fluocinonide ointment (LIDEX) 0.05 % APPLY TOPICALLY ONCE DAILY AS NEEDED 60 g 3  . glucose blood (ACCU-CHEK AVIVA PLUS) test strip USE TO TEST BLOOD SUGAR AS DIRECTED 100 each 3  .  InFLIXimab (REMICADE IV) Inject into the vein every 8 (eight) weeks.     . metFORMIN (GLUCOPHAGE-XR) 500 MG 24 hr tablet Take 2 tablets (1,000 mg total) by mouth daily with breakfast. 180 tablet 3  . niacin 500 MG tablet Take 500 mg by mouth daily. Take one tab once daily     . nitroGLYCERIN (NITROSTAT) 0.4 MG SL tablet Place 0.4 mg under the tongue every 5 (five) minutes as needed for chest pain. Up to three time and then call the doctor    . sulfamethoxazole-trimethoprim (BACTRIM DS,SEPTRA DS) 800-160 MG tablet Take 1 tablet by mouth every Monday, Wednesday, and Friday.  5  . traMADol (ULTRAM) 50 MG tablet Take 1 tablet (50 mg total) by mouth 2 (two) times daily as needed. 60 tablet 5  . valACYclovir (VALTREX) 500 MG tablet Take 1 tablet (500 mg total) by mouth as needed. (Patient taking differently: Take 500 mg by mouth 3 (three) times daily. ) 60 tablet 3  . desvenlafaxine (PRISTIQ) 50 MG 24 hr tablet Take 1 tablet (50 mg total) by mouth daily. 90 tablet 3  . glimepiride (AMARYL) 2 MG tablet Take 1 tablet (2 mg total) by mouth daily before breakfast. 90 tablet 3  . sildenafil (REVATIO) 20 MG tablet Take 2-5 tablets as needed once every 48 hours for erectile dysfunction 50 tablet 3  No current facility-administered medications for this visit.     Objective: BP 112/68 (BP Location: Left Arm, Patient Position: Sitting, Cuff Size: Large)   Pulse (!) 102   Temp 98.5 F (36.9 C) (Oral)   Ht 5\' 11"  (1.803 m)   Wt 179 lb (81.2 kg)   SpO2 96%   BMI 24.97 kg/m  Gen: NAD, resting comfortably  Assessment/Plan:  Depression S: phq9 of 4 las tvisit- best he has reported symptoms. We titrated to pristiq to 100mg  last visit- depression worsened- went back to 50mg . Also headaches- symptoms resolved on the 50mg  dose.  PHQ9 today of 2 A/P: well controlled. continue current dose pristiq 50mg . Well controlled. Discussed serotonin syndrome risk on tramadol  Controlled type 2 diabetes mellitus with  ophthalmic complication ([place]) S: still sounds mildly prooly controlled. On metformin 2000mg  XR (750mg  x2 in the morning plus a 500mg  tablet). Since increasing has had urgency of bowel movements at times.  CBGs- fasting sugars 144-187 Exercise and diet- walking 2 miles a day. Playing golf some. Weight stable. Lab Results  Component Value Date   HGBA1C 7.1 08/28/2016   HGBA1C 6.4 05/20/2016   HGBA1C 6.3 02/07/2016   A/P: a1c reasonable at 7.1 again today. due to diarrhea/urgency of stools- Reduce metformin to 1000mg  a day ER again.  Add amaryl 2mg  daily before breakfast sglt2 inhibitor back up- already has some nocturia so wants to    Future Appointments Date Time Provider [place]  02/19/2017 8:30 AM Marin Olp, MD [facility] None  10/31/2017 9:00 AM Stephanie Acre, RN [facility] None    Orders Placed This Encounter  Procedures  . Flu vaccine HIGH DOSE PF  . POCT glycosylated hemoglobin (Hb A1C)    Meds ordered this encounter  Medications  . DISCONTD: desvenlafaxine (PRISTIQ) 50 MG 24 hr tablet    Sig: Take 1 tablet by mouth daily.    Refill:  3  . desvenlafaxine (PRISTIQ) 50 MG 24 hr tablet    Sig: Take 1 tablet (50 mg total) by mouth daily.    Dispense:  90 tablet    Refill:  3  . metFORMIN (GLUCOPHAGE-XR) 500 MG 24 hr tablet    Sig: Take 2 tablets (1,000 mg total) by mouth daily with breakfast.    Dispense:  180 tablet    Refill:  3  . glimepiride (AMARYL) 2 MG tablet    Sig: Take 1 tablet (2 mg total) by mouth daily before breakfast.    Dispense:  90 tablet    Refill:  3  . sildenafil (REVATIO) 20 MG tablet    Sig: Take 2-5 tablets as needed once every 48 hours for erectile dysfunction    Dispense:  50 tablet    Refill:  3    Return precautions advised.  Garret Reddish, MD

## 2016-12-06 NOTE — Patient Instructions (Addendum)
Continue pristiq 50mg - you are doing great  Reduce metformin to 1000mg  a day ER again  Add amaryl 2mg  daily before breakfast  Schedule 02/06/17 or later for physical. Wont be able to check a1c quite yet but can check other bloodwork  Fingerprick before you go. Come fasting to next visit for full labs  ______________________________________________________________________  Starting October 1st 2018, I will be transferring to our new location:  I would love to have you remain my patient at this new location as long as it remains convenient for you. I am excited about the opportunity to have x-ray and sports medicine in the new building but will really miss the awesome staff and physicians at [place]. Continue to schedule appointments at [facility] and we will automatically transfer them to the [place] location starting October 1st.

## 2016-12-07 NOTE — Assessment & Plan Note (Signed)
S: phq9 of 4 las tvisit- best he has reported symptoms. We titrated to pristiq to 100mg  last visit- depression worsened- went back to 50mg . Also headaches- symptoms resolved on the 50mg  dose.  PHQ9 today of 2 A/P: well controlled. continue current dose pristiq 50mg . Well controlled. Discussed serotonin syndrome risk on tramadol

## 2016-12-07 NOTE — Assessment & Plan Note (Signed)
S: still sounds mildly prooly controlled. On metformin 2000mg  XR (750mg  x2 in the morning plus a 500mg  tablet). Since increasing has had urgency of bowel movements at times.  CBGs- fasting sugars 144-187 Exercise and diet- walking 2 miles a day. Playing golf some. Weight stable. Lab Results  Component Value Date   HGBA1C 7.1 08/28/2016   HGBA1C 6.4 05/20/2016   HGBA1C 6.3 02/07/2016   A/P: a1c reasonable at 7.1 again today. due to diarrhea/urgency of stools- Reduce metformin to 1000mg  a day ER again.  Add amaryl 2mg  daily before breakfast sglt2 inhibitor back up- already has some nocturia so wants to

## 2016-12-12 ENCOUNTER — Encounter: Payer: Self-pay | Admitting: Family Medicine

## 2016-12-12 DIAGNOSIS — N529 Male erectile dysfunction, unspecified: Secondary | ICD-10-CM | POA: Insufficient documentation

## 2017-02-19 ENCOUNTER — Encounter: Payer: Medicare Other | Admitting: Family Medicine

## 2017-02-20 ENCOUNTER — Ambulatory Visit (INDEPENDENT_AMBULATORY_CARE_PROVIDER_SITE_OTHER): Payer: Medicare Other | Admitting: Family Medicine

## 2017-02-20 ENCOUNTER — Encounter: Payer: Self-pay | Admitting: Family Medicine

## 2017-02-20 VITALS — BP 132/76 | HR 90 | Temp 98.6°F | Ht 71.0 in | Wt 187.4 lb

## 2017-02-20 DIAGNOSIS — I2510 Atherosclerotic heart disease of native coronary artery without angina pectoris: Secondary | ICD-10-CM | POA: Diagnosis not present

## 2017-02-20 DIAGNOSIS — E785 Hyperlipidemia, unspecified: Secondary | ICD-10-CM

## 2017-02-20 DIAGNOSIS — E11319 Type 2 diabetes mellitus with unspecified diabetic retinopathy without macular edema: Secondary | ICD-10-CM | POA: Diagnosis not present

## 2017-02-20 DIAGNOSIS — I341 Nonrheumatic mitral (valve) prolapse: Secondary | ICD-10-CM

## 2017-02-20 DIAGNOSIS — F330 Major depressive disorder, recurrent, mild: Secondary | ICD-10-CM

## 2017-02-20 DIAGNOSIS — E113293 Type 2 diabetes mellitus with mild nonproliferative diabetic retinopathy without macular edema, bilateral: Secondary | ICD-10-CM | POA: Diagnosis not present

## 2017-02-20 DIAGNOSIS — D473 Essential (hemorrhagic) thrombocythemia: Secondary | ICD-10-CM

## 2017-02-20 DIAGNOSIS — N183 Chronic kidney disease, stage 3 unspecified: Secondary | ICD-10-CM

## 2017-02-20 DIAGNOSIS — Z0000 Encounter for general adult medical examination without abnormal findings: Secondary | ICD-10-CM

## 2017-02-20 DIAGNOSIS — L4050 Arthropathic psoriasis, unspecified: Secondary | ICD-10-CM

## 2017-02-20 DIAGNOSIS — I10 Essential (primary) hypertension: Secondary | ICD-10-CM | POA: Diagnosis not present

## 2017-02-20 DIAGNOSIS — D75839 Thrombocytosis, unspecified: Secondary | ICD-10-CM

## 2017-02-20 LAB — CBC
HCT: 49.3 % (ref 39.0–52.0)
Hemoglobin: 16.3 g/dL (ref 13.0–17.0)
MCHC: 33.1 g/dL (ref 30.0–36.0)
MCV: 92 fl (ref 78.0–100.0)
RBC: 5.36 Mil/uL (ref 4.22–5.81)
RDW: 13.7 % (ref 11.5–15.5)
WBC: 7.1 10*3/uL (ref 4.0–10.5)

## 2017-02-20 LAB — COMPREHENSIVE METABOLIC PANEL
ALBUMIN: 4.5 g/dL (ref 3.5–5.2)
ALK PHOS: 43 U/L (ref 39–117)
ALT: 27 U/L (ref 0–53)
AST: 24 U/L (ref 0–37)
BILIRUBIN TOTAL: 0.6 mg/dL (ref 0.2–1.2)
BUN: 18 mg/dL (ref 6–23)
CALCIUM: 9.5 mg/dL (ref 8.4–10.5)
CHLORIDE: 103 meq/L (ref 96–112)
CO2: 27 mEq/L (ref 19–32)
CREATININE: 1.59 mg/dL — AB (ref 0.40–1.50)
GFR: 45.72 mL/min — ABNORMAL LOW (ref >60.00)
Glucose, Bld: 145 mg/dL — ABNORMAL HIGH (ref 70–99)
Potassium: 4.8 mEq/L (ref 3.5–5.1)
Sodium: 136 mEq/L (ref 135–145)
TOTAL PROTEIN: 6.8 g/dL (ref 6.0–8.3)

## 2017-02-20 LAB — LIPID PANEL
CHOL/HDL RATIO: 3
Cholesterol: 118 mg/dL (ref 0–200)
HDL: 39.7 mg/dL (ref >39.00)
LDL Cholesterol: 52 mg/dL (ref 0–99)
NONHDL: 78.74
Triglycerides: 135 mg/dL (ref 0.0–149.0)
VLDL: 27 mg/dL (ref 0.0–40.0)

## 2017-02-20 LAB — MICROALBUMIN / CREATININE URINE RATIO
CREATININE, U: 120.2 mg/dL
MICROALB UR: 3.7 mg/dL — AB (ref 0.0–1.9)
MICROALB/CREAT RATIO: 3 mg/g (ref 0.0–30.0)

## 2017-02-20 NOTE — Assessment & Plan Note (Signed)
Depression- did CBT last year and found helpful initially but does not want to repeat. On pristiq 50mg  and helpful as well as wellbutrin 150mg  BID. Last phq9 of 4. He states still has some bad days but overall he is pleased with progress. Offered counseling and psychiatry referral again- he declines both. No SI- will let me know if any worsening

## 2017-02-20 NOTE — Assessment & Plan Note (Signed)
continues to go to Dr. Amil Amen and is on remicade. Also sees [facility] ID for immunodeficiency. Also uses tramadol for pain through our office- he is using twice a day. States aches have been slightly worse over last 2-3 weeks and has 2-3 weeks before next injection. Encouraged him to follow up with rheumatology

## 2017-02-20 NOTE — Assessment & Plan Note (Addendum)
Diabetes- controlled on metformin  XR 2000mg  -750mg  x2 in AM and 500mg  tablet later in day but had diarrhea so we reduced to 1g metformin and added amaryl 2mg  CBGs- all #s under 180. fastings from 120s to 140s Lab Results  Component Value Date   HGBA1C 7.1 12/06/2016   Continuing to follow up with [facility]- has mild non proliferative diabetic retinopathy

## 2017-02-20 NOTE — Progress Notes (Signed)
Phone: [phone]  Subjective:  Patient presents today for their annual physical. Chief complaint-noted.   See problem oriented charting- ROS- full  review of systems was completed and negative except for: arthralgias with psoriatic arthritis. No hypoglycemia. No chest pain or shortness of breath. No blurry vision.   The following were reviewed and entered/updated in epic: Past Medical History:  Diagnosis Date  . Allergic rhinitis   . Anxiety   . CAD (coronary artery disease)   . Collagen vascular disease ([place])   . Dermatitis    other atopic  . Diabetes mellitus    type II  . Erectile dysfunction   . Fatigue   . Hyperlipidemia   . Hypertension   . Leukopenia   . Myocardial infarction ([place])   . Osteoarthritis    Patient Active Problem List   Diagnosis Date Noted  . Thrombocytosis ([place]) 04/03/2016    Priority: High  . Depression 03/27/2015    Priority: High  . Psoriatic arthritis ([place]) 01/25/2013    Priority: High  . Mitral valve prolapse 12/25/2010    Priority: High  . Leukocytopenia 11/24/2006    Priority: High  . Controlled type 2 diabetes mellitus with ophthalmic complication ([place]) 25/95/6387    Priority: High  . CAD (coronary artery disease) with history MI 2004- no intervention 10/06/2006    Priority: High  . Dyshidrotic eczema 02/07/2016    Priority: Medium  . CKD (chronic kidney disease), stage III ([place]) 09/15/2015    Priority: Medium  . History of BPH 12/14/2009    Priority: Medium  . Hyperlipidemia 10/06/2006    Priority: Medium  . Essential hypertension 10/06/2006    Priority: Medium  . Diabetic retinopathy ([place]) 02/20/2017    Priority: Low  . ERECTILE DYSFUNCTION 11/26/2007    Priority: Low   Past Surgical History:  Procedure Laterality Date  . ESOPHAGOGASTRODUODENOSCOPY  04/23/2005  . KNEE ARTHROSCOPY     right  . TONSILLECTOMY      History reviewed. No pertinent family history.  Medications- reviewed and updated Current Outpatient  Medications  Medication Sig Dispense Refill  . amLODipine (NORVASC) 10 MG tablet Take 1 tablet (10 mg total) by mouth daily. 100 tablet 3  . aspirin EC 81 MG tablet Take 81 mg by mouth daily.    . BD ULTRA-FINE LANCETS lancets Use once daily for glucose control 100 each 3  . buPROPion (WELLBUTRIN SR) 150 MG 12 hr tablet Take 1 tablet (150 mg total) by mouth 2 (two) times daily. 180 tablet 3  . clindamycin (CLEOCIN) 300 MG capsule Take 300 mg by mouth 3 (three) times daily.    Marland Kitchen desvenlafaxine (PRISTIQ) 50 MG 24 hr tablet Take 1 tablet (50 mg total) by mouth daily. 90 tablet 3  . fluconazole (DIFLUCAN) 100 MG tablet Take 100 mg by mouth daily as needed.     . fluocinonide ointment (LIDEX) 0.05 % APPLY TOPICALLY ONCE DAILY AS NEEDED 60 g 3  . glimepiride (AMARYL) 2 MG tablet Take 1 tablet (2 mg total) by mouth daily before breakfast. 90 tablet 3  . glucose blood (ACCU-CHEK AVIVA PLUS) test strip USE TO TEST BLOOD SUGAR AS DIRECTED 100 each 3  . InFLIXimab (REMICADE IV) Inject into the vein every 8 (eight) weeks.     . metFORMIN (GLUCOPHAGE-XR) 500 MG 24 hr tablet Take 2 tablets (1,000 mg total) by mouth daily with breakfast. 180 tablet 3  . niacin 500 MG tablet Take 500 mg by mouth daily. Take one tab once  daily     . nitroGLYCERIN (NITROSTAT) 0.4 MG SL tablet Place 0.4 mg under the tongue every 5 (five) minutes as needed for chest pain. Up to three time and then call the doctor    . sildenafil (REVATIO) 20 MG tablet Take 2-5 tablets as needed once every 48 hours for erectile dysfunction 50 tablet 3  . sulfamethoxazole-trimethoprim (BACTRIM DS,SEPTRA DS) 800-160 MG tablet Take 1 tablet by mouth every Monday, Wednesday, and Friday.  5  . traMADol (ULTRAM) 50 MG tablet Take 1 tablet (50 mg total) by mouth 2 (two) times daily as needed. 60 tablet 5  . valACYclovir (VALTREX) 500 MG tablet Take 1 tablet (500 mg total) by mouth as needed. (Patient taking differently: Take 500 mg by mouth 3 (three) times  daily. ) 60 tablet 3   No current facility-administered medications for this visit.     Allergies-reviewed and updated Allergies  Allergen Reactions  . Codeine     REACTION: itching and rash  . Lisinopril   . Penicillins     REACTION: as a child    Social History   Socioeconomic History  . Marital status: Married    Spouse name: None  . Number of children: None  . Years of education: None  . Highest education level: None  Social Needs  . Financial resource strain: None  . Food insecurity - worry: None  . Food insecurity - inability: None  . Transportation needs - medical: None  . Transportation needs - non-medical: None  Occupational History  . None  Tobacco Use  . Smoking status: Former Smoker    Packs/day: 2.00    Years: 30.00    Pack years: 60.00    Types: Cigarettes    Last attempt to quit: 12/09/2001    Years since quitting: 15.2  . Smokeless tobacco: Former Systems developer    Types: [place] date: 03/11/2002  Substance and Sexual Activity  . Alcohol use: No    Alcohol/week: 0.0 oz  . Drug use: No  . Sexual activity: None  Other Topics Concern  . None  Social History Narrative   Married. 2 children. 2 stepchildren. 7 grandkids including step   Take care of daughter of law Emran Molzahn- son left her with 42 year old.    need to go through most history about social      35 years [place] [employer]      Hobbies: golfer, gardening    Objective: BP 132/76 (BP Location: Left Arm, Patient Position: Sitting, Cuff Size: Large)   Pulse 90   Temp 98.6 F (37 C) (Oral)   Ht 5\' 11"  (1.803 m)   Wt 187 lb 6.4 oz (85 kg)   SpO2 96%   BMI 26.14 kg/m  Gen: NAD, resting comfortably HEENT: Mucous membranes are moist. Oropharynx normal Neck: no thyromegaly CV: RRR no rubs or gallops Lungs: CTAB no crackles, wheeze, rhonchi Abdomen: soft/nontender/nondistended/normal bowel sounds. No rebound or guarding. overweight Ext: no edema Skin: warm, dry Neuro: grossly normal, moves all  extremities, PERRLA  We jointly opted out of rectal exam  Assessment/Plan:  71 y.o. male presenting for annual physical.  Health Maintenance counseling: 1. Anticipatory guidance: Patient counseled regarding regular dental exams -q6 months, eye exams -yearly, wearing seatbelts.  2. Risk factor reduction:  Advised patient of need for regular exercise and diet rich and fruits and vegetables to reduce risk of heart attack and stroke. Weight up substantial amount over Thanksgiving- discussed reversing trend. He  has been missing walking his 2 miles- he plans to restart Wt Readings from Last 3 Encounters:  02/20/17 187 lb 6.4 oz (85 kg)  12/06/16 179 lb (81.2 kg)  10/30/16 180 lb (81.6 kg)  3. Immunizations/screenings/ancillary studies- discussed shingrix availability issues - and getting at pharmacy Immunization History  Administered Date(s) Administered  . Influenza Split 01/23/2012, 11/19/2012  . Influenza Whole 11/26/2007, 12/14/2009  . Influenza, High Dose Seasonal PF 02/01/2014, 02/06/2015, 02/07/2016, 12/06/2016  . Influenza, Seasonal, Injecte, Preservative Fre 01/05/2009  . Influenza,inj,Quad PF,6+ Mos 11/19/2012  . Influenza-Unspecified 01/05/2009  . Pneumococcal Conjugate-13 06/02/2013  . Pneumococcal Polysaccharide-23 03/11/2004, 06/29/2009  . Td 03/11/2002  . Tetanus 03/10/2013  4. Prostate cancer screening-  BPH on exam last year and has nocturia still 2-3x a week. He would like to continue PSA- we agreed to stop this year given no change in symptoms and stable PSA trend Lab Results  Component Value Date   PSA 0.67 02/07/2016   PSA 0.54 02/06/2015   PSA 0.56 02/01/2014   5. Colon cancer screening - 2010 with 10 year repeat- see letter from GI 6. Skin cancer screening- has a dermatologist- sees as needed.  advised regular sunscreen use. Denies worrisome, changing, or new skin lesions.   Status of chronic or acute concerns  CAD- still seeing cardiology (over a year so  advised follow up) . Not using nitro. Compliant with aspirin and simvastatin 20mg  for his HLD with LDL goal under 70  MV prolapse- mild bileaflet noted echo 09/01/15  HTN- controlled on amlodipine 5mg  alone  Eczema- gloves/lidex helps when he uses it.   CKD III- GFR around 60- will monitor today. Knows to avoid nsaids  Psoriatic arthritis ([place]) continues to go to Dr. Amil Amen and is on remicade. Also sees [facility] ID for immunodeficiency. Also uses tramadol for pain through our office- he is using twice a day. States aches have been slightly worse over last 2-3 weeks and has 2-3 weeks before next injection. Encouraged him to follow up with rheumatology  Controlled type 2 diabetes mellitus with ophthalmic complication ([place]) Diabetes- controlled on metformin  XR 2000mg  -750mg  x2 in AM and 500mg  tablet later in day but had diarrhea so we reduced to 1g metformin and added amaryl 2mg  CBGs- all #s under 180. fastings from 120s to 140s Lab Results  Component Value Date   HGBA1C 7.1 12/06/2016   Continuing to follow up with [facility]- has mild non proliferative diabetic retinopathy  Depression Depression- did CBT last year and found helpful initially but does not want to repeat. On pristiq 50mg  and helpful as well as wellbutrin 150mg  BID. Last phq9 of 4. He states still has some bad days but overall he is pleased with progress. Offered counseling and psychiatry referral again- he declines both. No SI- will let me know if any worsening  Thrombocytosis ([place]) Long term thrombocytosis- will monitor today with CBC. Potentially related to psoriatic arthritis or remicade   Future Appointments  Date Time Provider [place]  10/31/2017  9:00 AM Williemae Area, RN [facility]   Return in about 4 months (around 06/21/2017) for follow up- or sooner if needed.  Orders Placed This Encounter  Procedures  . Microalbumin / creatinine urine ratio    [place]  . Lipid panel    [place]    Order  Specific Question:   Has the patient fasted?    Answer:   No  . CBC    [place]  . Comprehensive metabolic panel  [place]    Order Specific Question:   Has the patient fasted?    Answer:   No   Return precautions advised.  Garret Reddish, MD

## 2017-02-20 NOTE — Assessment & Plan Note (Signed)
Long term thrombocytosis- will monitor today with CBC. Potentially related to psoriatic arthritis or remicade

## 2017-02-20 NOTE — Patient Instructions (Addendum)
Consider shingrix at your pharmacy. The old shingles vaccination Zostavax may not have been the best choice for you. Could also discuss with Dr. Amil Amen for ideal dosage time with your remicade injections  Reasonable to go ahead and call for cardiology and dermatology follow up  Weight up 8 lbs- have to get back on the walking and healthy eating  Obviously let us know if depression worsens and certainly contact us or 911 immediately if you have any thoughts of self harm  Too early to check a1c Lab Results  Component Value Date   HGBA1C 7.1 12/06/2016  Lets do a 4 month follow up  Please stop by lab before you go  I would also like for you to sign up for an annual wellness visit with our nurse, Cassie, who specializes in the annual wellness exam. This is a free benefit under medicare that may help [place] find additional ways to help you. Some highlights are reviewing medications, lifestyle, and doing a dementia screen.

## 2017-03-07 ENCOUNTER — Other Ambulatory Visit: Payer: Self-pay | Admitting: Family Medicine

## 2017-05-02 ENCOUNTER — Encounter: Payer: Self-pay | Admitting: Cardiovascular Disease

## 2017-05-02 ENCOUNTER — Ambulatory Visit: Payer: Medicare Other | Admitting: Cardiovascular Disease

## 2017-05-02 ENCOUNTER — Other Ambulatory Visit: Payer: Self-pay

## 2017-05-02 VITALS — BP 154/81 | HR 104 | Ht 71.0 in | Wt 192.0 lb

## 2017-05-02 DIAGNOSIS — I25118 Atherosclerotic heart disease of native coronary artery with other forms of angina pectoris: Secondary | ICD-10-CM

## 2017-05-02 DIAGNOSIS — I341 Nonrheumatic mitral (valve) prolapse: Secondary | ICD-10-CM

## 2017-05-02 DIAGNOSIS — I340 Nonrheumatic mitral (valve) insufficiency: Secondary | ICD-10-CM

## 2017-05-02 DIAGNOSIS — E785 Hyperlipidemia, unspecified: Secondary | ICD-10-CM | POA: Diagnosis not present

## 2017-05-02 DIAGNOSIS — Z955 Presence of coronary angioplasty implant and graft: Secondary | ICD-10-CM

## 2017-05-02 DIAGNOSIS — I10 Essential (primary) hypertension: Secondary | ICD-10-CM | POA: Diagnosis not present

## 2017-05-02 MED ORDER — SIMVASTATIN 20 MG PO TABS: 20.0000 mg | ORAL_TABLET | Freq: Every day | ORAL | Status: DC

## 2017-05-02 NOTE — Patient Instructions (Signed)

## 2017-05-02 NOTE — Progress Notes (Signed)
SUBJECTIVE: The patient returns for routine follow up.  I have not seen him since July 2016.  He has a history of coronary artery disease and inferior wall myocardial infarction (2004), hypertension, diabetes mellitus, mitral valve prolapse with mitral regurgitation, and hyperlipidemia.   Echocardiogram 09/01/15 showed normal left ventricular systolic function, LVEF 80-99%, grade 1 diastolic dysfunction, mild mitral bileaflet prolapse with moderate to severe regurgitation.  There was mild left atrial dilatation.  Nuclear stress test in June 2017 demonstrated old MI with no evidence of ischemia.  The patient denies any symptoms of chest pain, palpitations, shortness of breath, lightheadedness, dizziness, leg swelling, orthopnea, PND, and syncope.  ECG performed today which I reviewed demonstrates sinus rhythm with low voltage and no ischemic ST segment or T wave abnormalities, nor any arrhythmias.    Review of Systems: As per "subjective", otherwise negative.  Allergies  Allergen Reactions  . Codeine     REACTION: itching and rash  . Lisinopril   . Penicillins     REACTION: as a child    Current Outpatient Medications  Medication Sig Dispense Refill  . amLODipine (NORVASC) 10 MG tablet Take 1 tablet (10 mg total) by mouth daily. 100 tablet 3  . aspirin EC 81 MG tablet Take 81 mg by mouth daily.    . BD ULTRA-FINE LANCETS lancets Use once daily for glucose control 100 each 3  . buPROPion (WELLBUTRIN SR) 150 MG 12 hr tablet Take 1 tablet (150 mg total) by mouth 2 (two) times daily. 180 tablet 3  . clindamycin (CLEOCIN) 300 MG capsule Take 300 mg by mouth 3 (three) times daily.    Marland Kitchen desvenlafaxine (PRISTIQ) 50 MG 24 hr tablet Take 1 tablet (50 mg total) by mouth daily. 90 tablet 3  . fluconazole (DIFLUCAN) 100 MG tablet Take 100 mg by mouth daily as needed.     . fluocinonide ointment (LIDEX) 0.05 % APPLY TOPICALLY ONCE DAILY AS NEEDED 60 g 3  . glimepiride (AMARYL) 2 MG tablet  Take 1 tablet (2 mg total) by mouth daily before breakfast. 90 tablet 3  . glucose blood (ACCU-CHEK AVIVA PLUS) test strip USE TO TEST BLOOD SUGAR AS DIRECTED 100 each 3  . InFLIXimab (REMICADE IV) Inject into the vein every 8 (eight) weeks.     . metFORMIN (GLUCOPHAGE-XR) 500 MG 24 hr tablet Take 2 tablets (1,000 mg total) by mouth daily with breakfast. 180 tablet 3  . niacin 500 MG tablet Take 500 mg by mouth daily. Take one tab once daily     . nitroGLYCERIN (NITROSTAT) 0.4 MG SL tablet Place 0.4 mg under the tongue every 5 (five) minutes as needed for chest pain. Up to three time and then call the doctor    . sildenafil (REVATIO) 20 MG tablet Take 2-5 tablets as needed once every 48 hours for erectile dysfunction 50 tablet 3  . sulfamethoxazole-trimethoprim (BACTRIM DS,SEPTRA DS) 800-160 MG tablet Take 1 tablet by mouth every Monday, Wednesday, and Friday.  5  . traMADol (ULTRAM) 50 MG tablet TAKE 1 TABLET BY MOUTH TWICE DAILY AS NEEDED 60 tablet 2  . valACYclovir (VALTREX) 500 MG tablet Take 1 tablet (500 mg total) by mouth as needed. (Patient taking differently: Take 500 mg by mouth 3 (three) times daily. ) 60 tablet 3   No current facility-administered medications for this visit.     Past Medical History:  Diagnosis Date  . Allergic rhinitis   . Anxiety   . CAD (  coronary artery disease)   . Collagen vascular disease ([place])   . Dermatitis    other atopic  . Diabetes mellitus    type II  . Erectile dysfunction   . Fatigue   . Hyperlipidemia   . Hypertension   . Leukopenia   . Myocardial infarction ([place])   . Osteoarthritis     Past Surgical History:  Procedure Laterality Date  . ESOPHAGOGASTRODUODENOSCOPY  04/23/2005  . KNEE ARTHROSCOPY     right  . TONSILLECTOMY      Social History   Socioeconomic History  . Marital status: Married    Spouse name: Not on file  . Number of children: Not on file  . Years of education: Not on file  . Highest education level: Not on  file  Social Needs  . Financial resource strain: Not on file  . Food insecurity - worry: Not on file  . Food insecurity - inability: Not on file  . Transportation needs - medical: Not on file  . Transportation needs - non-medical: Not on file  Occupational History  . Not on file  Tobacco Use  . Smoking status: Former Smoker    Packs/day: 2.00    Years: 30.00    Pack years: 60.00    Types: Cigarettes    Last attempt to quit: 12/09/2001    Years since quitting: 15.4  . Smokeless tobacco: Former Systems developer    Types: [place] date: 03/11/2002  Substance and Sexual Activity  . Alcohol use: No    Alcohol/week: 0.0 oz  . Drug use: No  . Sexual activity: Not on file  Other Topics Concern  . Not on file  Social History Narrative   Married. 2 children. 2 stepchildren. 7 grandkids including step   Take care of daughter of law Dvon Jiles- son left her with 65 year old.    need to go through most history about social      35 years [place] [employer]      Hobbies: golfer, gardening     Vitals:   05/02/17 1548  BP: (!) 154/81  Pulse: (!) 104  SpO2: 97%  Weight: 192 lb (87.1 kg)  Height: 5\' 11"  (1.803 m)    Wt Readings from Last 3 Encounters:  05/02/17 192 lb (87.1 kg)  02/20/17 187 lb 6.4 oz (85 kg)  12/06/16 179 lb (81.2 kg)     PHYSICAL EXAM General: NAD HEENT: Normal. Neck: No JVD, no thyromegaly. Lungs: Clear to auscultation bilaterally with normal respiratory effort. CV: Regular rate and rhythm, normal S1/S2, no S3/S4, III/VI apical holosystolic murmur with radiation to axilla. No pretibial or periankle edema.  No carotid bruit.   Abdomen: Soft, nontender, no distention.  Neurologic: Alert and oriented.  Psych: Normal affect. Skin: Normal. Musculoskeletal: No gross deformities.    ECG: Most recent ECG reviewed.   Labs: Lab Results  Component Value Date/Time   K 4.8 02/20/2017 08:59 AM   BUN 18 02/20/2017 08:59 AM   CREATININE 1.59 (H) 02/20/2017 08:59 AM   ALT 27  02/20/2017 08:59 AM   TSH 1.42 02/06/2015 09:51 AM   HGB 16.3 02/20/2017 08:59 AM     Lipids: Lab Results  Component Value Date/Time   LDLCALC 52 02/20/2017 08:59 AM   CHOL 118 02/20/2017 08:59 AM   TRIG 135.0 02/20/2017 08:59 AM   HDL 39.70 02/20/2017 08:59 AM       ASSESSMENT AND PLAN:  1. CAD with h/o inferior wall MI: Symptomatically  stable. Continue ASA 81 mg daily and simvastatin. Prefers to avoid a beta blocker.  2.  Bileaflet mitral valve prolapse with moderate to severe mitral regurgitation: He is entirely asymptomatic. No symptoms of heart failure. Continued surveillance monitoring.  I will obtain an echocardiogram.  3. Essential HTN: Elevated today.  Blood pressure was normal at his PCPs office in September and December 2018.  This will need further monitoring to see if further antihypertensive titration is indicated.  4. Hyperlipidemia: He is currently on niacin and simvastatin 20 mg.  LDL 52 on 02/20/17.  Total cholesterol 118, triglycerides 135, HDL 39.7.   Disposition: Follow up 1 year   Kate Sable, M.D., F.A.C.C.

## 2017-05-12 ENCOUNTER — Other Ambulatory Visit: Payer: Self-pay | Admitting: Family Medicine

## 2017-05-12 DIAGNOSIS — E7800 Pure hypercholesterolemia, unspecified: Secondary | ICD-10-CM

## 2017-05-16 ENCOUNTER — Telehealth: Payer: Self-pay | Admitting: Family Medicine

## 2017-05-16 ENCOUNTER — Other Ambulatory Visit: Payer: Self-pay

## 2017-05-16 MED ORDER — GLUCOSE BLOOD VI STRP: ORAL_STRIP | 1 refills | Status: DC

## 2017-05-16 NOTE — Telephone Encounter (Signed)
[facility] pharmacy called about the accucheck test strips. They will need clarification on how many times a day is the patient checking his blood sugar so the amount dispensed will match and the insurance will pay for the strips. I advised there is no documentation about this in the last OV note, so this would be sent to Dr. Yong Channel for recommendation.

## 2017-05-16 NOTE — Telephone Encounter (Signed)
See note

## 2017-05-16 NOTE — Telephone Encounter (Signed)
Called patient and he verified that he is checking blood sugar every day. I will resend order to clarify and call pharmacy.

## 2017-05-16 NOTE — Telephone Encounter (Signed)
Copied from [place] [id number]. Topic: Quick Communication - See Telephone Encounter >> May 16, 2017 12:05 PM Arletha Grippe wrote: CRM for notification. See Telephone encounter for:   05/16/17.  Pharmacy called about accu check test strips.  They need to know how pt needs to use his test strips for his diabetes in order for insurance to cover.  Please call pharm back at [facility] [place] faxed a request fro clarification on 05/12/17, went to [place] office.  They stated that was the number on the rx

## 2017-05-21 ENCOUNTER — Ambulatory Visit (INDEPENDENT_AMBULATORY_CARE_PROVIDER_SITE_OTHER): Payer: Medicare Other

## 2017-05-21 ENCOUNTER — Other Ambulatory Visit: Payer: Self-pay

## 2017-05-21 DIAGNOSIS — I340 Nonrheumatic mitral (valve) insufficiency: Secondary | ICD-10-CM | POA: Diagnosis not present

## 2017-05-23 ENCOUNTER — Telehealth: Payer: Self-pay | Admitting: *Deleted

## 2017-05-23 NOTE — Telephone Encounter (Signed)
Notes recorded by Laurine Blazer, LPN on 5/92/7639 at 43:20 AM EDT Patient notified. Copy to pmd. ------  Notes recorded by Herminio Commons, MD on 05/22/2017 at 10:11 AM EDT Normal pumping function.

## 2017-06-10 ENCOUNTER — Other Ambulatory Visit: Payer: Self-pay | Admitting: Family Medicine

## 2017-06-24 ENCOUNTER — Ambulatory Visit: Payer: Medicare Other | Admitting: Family Medicine

## 2017-06-24 ENCOUNTER — Encounter: Payer: Self-pay | Admitting: Family Medicine

## 2017-06-24 VITALS — BP 124/74 | HR 87 | Temp 98.6°F | Ht 71.0 in | Wt 186.4 lb

## 2017-06-24 DIAGNOSIS — N183 Chronic kidney disease, stage 3 unspecified: Secondary | ICD-10-CM

## 2017-06-24 DIAGNOSIS — E113293 Type 2 diabetes mellitus with mild nonproliferative diabetic retinopathy without macular edema, bilateral: Secondary | ICD-10-CM

## 2017-06-24 DIAGNOSIS — D75839 Thrombocytosis, unspecified: Secondary | ICD-10-CM

## 2017-06-24 DIAGNOSIS — F3342 Major depressive disorder, recurrent, in full remission: Secondary | ICD-10-CM | POA: Diagnosis not present

## 2017-06-24 DIAGNOSIS — L4050 Arthropathic psoriasis, unspecified: Secondary | ICD-10-CM

## 2017-06-24 DIAGNOSIS — D473 Essential (hemorrhagic) thrombocythemia: Secondary | ICD-10-CM | POA: Diagnosis not present

## 2017-06-24 DIAGNOSIS — E11319 Type 2 diabetes mellitus with unspecified diabetic retinopathy without macular edema: Secondary | ICD-10-CM

## 2017-06-24 LAB — BASIC METABOLIC PANEL
BUN: 18 mg/dL (ref 6–23)
CHLORIDE: 103 meq/L (ref 96–112)
CO2: 25 meq/L (ref 19–32)
CREATININE: 1.37 mg/dL (ref 0.40–1.50)
Calcium: 9.7 mg/dL (ref 8.4–10.5)
GFR: 54.24 mL/min — ABNORMAL LOW (ref >60.00)
Glucose, Bld: 168 mg/dL — ABNORMAL HIGH (ref 70–99)
POTASSIUM: 4.4 meq/L (ref 3.5–5.1)
Sodium: 135 mEq/L (ref 135–145)

## 2017-06-24 LAB — HEMOGLOBIN A1C: HEMOGLOBIN A1C: 6.9 % — AB (ref 4.6–6.5)

## 2017-06-24 LAB — CBC WITH DIFFERENTIAL/PLATELET
Basophils Absolute: 0.1 10*3/uL (ref 0.0–0.1)
Basophils Relative: 1.1 % (ref 0.0–3.0)
EOS ABS: 0.2 10*3/uL (ref 0.0–0.7)
Eosinophils Relative: 2.4 % (ref 0.0–5.0)
HEMATOCRIT: 45.7 % (ref 39.0–52.0)
HEMOGLOBIN: 15.9 g/dL (ref 13.0–17.0)
LYMPHS PCT: 9.5 % — AB (ref 12.0–46.0)
Lymphs Abs: 0.7 10*3/uL (ref 0.7–4.0)
MCHC: 34.9 g/dL (ref 30.0–36.0)
MCV: 89.9 fl (ref 78.0–100.0)
MONOS PCT: 7.9 % (ref 3.0–12.0)
Monocytes Absolute: 0.6 10*3/uL (ref 0.1–1.0)
Neutro Abs: 6 10*3/uL (ref 1.4–7.7)
Neutrophils Relative %: 79.1 % — ABNORMAL HIGH (ref 43.0–77.0)
PLATELETS: 776 10*3/uL — AB (ref 150.0–400.0)
RBC: 5.08 Mil/uL (ref 4.22–5.81)
RDW: 14.5 % (ref 11.5–15.5)
WBC: 7.5 10*3/uL (ref 4.0–10.5)

## 2017-06-24 NOTE — Assessment & Plan Note (Signed)
S: reasonably controlled. On metformin XR 2000mg  total per day but had diarrhea so we went to 1g metformin and 2mg  amaryl.  CBGs- got ill for 2 weeks and sugars up in 200s or even 300s fasting- now all the way back down into low 100s for most part.  Lab Results  Component Value Date   HGBA1C 7.1 12/06/2016   HGBA1C 7.1 08/28/2016   HGBA1C 6.4 05/20/2016   A/P: unlikely to adjust meds unless a1c over 8 given cbg increase with illness.  - continues to follow up with optho for mild non proliferative diabetic retinopathy, I would consider going up on metformin if kidneys ok- though does have some diarrhea still

## 2017-06-24 NOTE — Assessment & Plan Note (Signed)
Has been stable for most part below 60 slightly- slightly worse on last check- update bmet today

## 2017-06-24 NOTE — Assessment & Plan Note (Signed)
S: Patient sees Dr. Amil Amen and remains on remicade. Also sees [facility] ID for immunodeficiency.   We give him tramadol for pain control which he uses twice a day.   He has thrombocytosis thought related to psoriatic arthritis or treatment with remicade A/P: continue to follow up with rheumatology. Will check cbc and bmp today

## 2017-06-24 NOTE — Progress Notes (Signed)
Your CBC was stable (blood counts, infection fighting cells, platelets) with very high platelets still Your BMET was stable (kidney, electrolytes, blood sugar) with slightly high blood sugar and slight loss of kidney function.  Your a1c looks pretty good at 6.9 despite those 2 weeks with higher #s. See you in 6 months since you are doing so well

## 2017-06-24 NOTE — Progress Notes (Signed)
Subjective:  Chase Anderson is a 72 y.o. year old very pleasant male patient who presents for/with See problem oriented charting ROS- No chest pain or shortness of breath. No headache or blurry vision.    Past Medical History-  Patient Active Problem List   Diagnosis Date Noted  . Thrombocytosis ([place]) 04/03/2016    Priority: High  . Depression 03/27/2015    Priority: High  . Psoriatic arthritis ([place]) 01/25/2013    Priority: High  . Mitral valve prolapse 12/25/2010    Priority: High  . Leukocytopenia 11/24/2006    Priority: High  . Controlled type 2 diabetes mellitus with ophthalmic complication ([place]) 16/12/9602    Priority: High  . CAD (coronary artery disease) with history MI 2004- no intervention 10/06/2006    Priority: High  . Dyshidrotic eczema 02/07/2016    Priority: Medium  . CKD (chronic kidney disease), stage III ([place]) 09/15/2015    Priority: Medium  . History of BPH 12/14/2009    Priority: Medium  . Hyperlipidemia 10/06/2006    Priority: Medium  . Essential hypertension 10/06/2006    Priority: Medium  . Diabetic retinopathy ([place]) 02/20/2017    Priority: Low  . ERECTILE DYSFUNCTION 11/26/2007    Priority: Low    Medications- reviewed and updated Current Outpatient Medications  Medication Sig Dispense Refill  . amLODipine (NORVASC) 10 MG tablet Take 1 tablet (10 mg total) by mouth daily. 100 tablet 3  . aspirin EC 81 MG tablet Take 81 mg by mouth daily.    . BD ULTRA-FINE LANCETS lancets Use once daily for glucose control 100 each 3  . buPROPion (WELLBUTRIN SR) 150 MG 12 hr tablet Take 1 tablet (150 mg total) by mouth 2 (two) times daily. 180 tablet 3  . clindamycin (CLEOCIN) 300 MG capsule Take 300 mg by mouth 3 (three) times daily.    Marland Kitchen desvenlafaxine (PRISTIQ) 50 MG 24 hr tablet Take 1 tablet (50 mg total) by mouth daily. 90 tablet 3  . fluconazole (DIFLUCAN) 100 MG tablet Take 100 mg by mouth daily as needed.     . fluocinonide ointment (LIDEX) 0.05 % APPLY  TOPICALLY ONCE DAILY AS NEEDED 60 g 3  . glimepiride (AMARYL) 2 MG tablet Take 1 tablet (2 mg total) by mouth daily before breakfast. 90 tablet 3  . glucose blood (ACCU-CHEK AVIVA PLUS) test strip Use to check blood sugar daily E11.9 100 each 1  . InFLIXimab (REMICADE IV) Inject into the vein every 8 (eight) weeks.     . metFORMIN (GLUCOPHAGE-XR) 500 MG 24 hr tablet Take 2 tablets (1,000 mg total) by mouth daily with breakfast. 180 tablet 3  . niacin 500 MG tablet Take 500 mg by mouth daily. Take one tab once daily     . nitroGLYCERIN (NITROSTAT) 0.4 MG SL tablet Place 0.4 mg under the tongue every 5 (five) minutes as needed for chest pain. Up to three time and then call the doctor    . sildenafil (REVATIO) 20 MG tablet Take 2-5 tablets as needed once every 48 hours for erectile dysfunction 50 tablet 3  . simvastatin (ZOCOR) 20 MG tablet Take 1 tablet (20 mg total) by mouth daily.    Marland Kitchen sulfamethoxazole-trimethoprim (BACTRIM DS,SEPTRA DS) 800-160 MG tablet Take 1 tablet by mouth every Monday, Wednesday, and Friday.  5  . traMADol (ULTRAM) 50 MG tablet TAKE 1 TABLET BY MOUTH TWICE DAILY AS NEEDED 60 tablet 0  . valACYclovir (VALTREX) 500 MG tablet Take 1 tablet (500 mg  total) by mouth as needed. (Patient taking differently: Take 500 mg by mouth 3 (three) times daily. ) 60 tablet 3   No current facility-administered medications for this visit.     Objective: BP 124/74 (BP Location: Left Arm, Patient Position: Sitting, Cuff Size: Large)   Pulse 87   Temp 98.6 F (37 C) (Oral)   Ht 5\' 11"  (1.803 m)   Wt 186 lb 6.4 oz (84.6 kg)   SpO2 96%   BMI 26.00 kg/m  Gen: NAD, resting comfortably CV: RRR murmur noted Lungs: CTAB no crackles, wheeze, rhonchi Abdomen: soft/nontender/nondistended Ext: no edema Skin: warm, dry Neuro: normal speech Diabetic Foot Exam - Simple   Simple Foot Form Diabetic Foot exam was performed with the following findings:  Yes 06/24/2017  9:31 AM  Visual Inspection No  deformities, no ulcerations, no other skin breakdown bilaterally:  Yes Sensation Testing Intact to touch and monofilament testing bilaterally:  Yes Pulse Check Posterior Tibialis and Dorsalis pulse intact bilaterally:  Yes Comments Some thickening of skin at base of MTPs both feet    Assessment/Plan:  Psoriatic arthritis (HCC) S: Patient sees Dr. Amil Amen and remains on remicade. Also sees [facility] for immunodeficiency.   We give him tramadol for pain control which he uses twice a day.   He has thrombocytosis thought related to psoriatic arthritis or treatment with remicade A/P: continue to follow up with rheumatology. Will check cbc and bmp today  CKD (chronic kidney disease), stage III Has been stable for most part below 60 slightly- slightly worse on last check- update bmet today  Controlled type 2 diabetes mellitus with ophthalmic complication ([place]) S: reasonably controlled. On metformin XR 2000mg  total per day but had diarrhea so we went to 1g metformin and 2mg  amaryl.  CBGs- got ill for 2 weeks and sugars up in 200s or even 300s fasting- now all the way back down into low 100s for most part.  Lab Results  Component Value Date   HGBA1C 7.1 12/06/2016   HGBA1C 7.1 08/28/2016   HGBA1C 6.4 05/20/2016   A/P: unlikely to adjust meds unless a1c over 8 given cbg increase with illness.  - continues to follow up with optho for mild non proliferative diabetic retinopathy, I would consider going up on metformin if kidneys ok- though does have some diarrhea still  Depression phq9 of 3-4. Doing really well with pristiq 100mg  and wellbutrin 100mg .    Future Appointments  Date Time Provider [place]  10/31/2017  9:00 AM Wynonia Musty, Tyler Aas, RN [facility]   4-6 months depending on a1c as per avs Lab/Order associations: Psoriatic arthritis ([place])  Thrombocytosis ([place]) - Plan: CBC with Differential/Platelet  CKD (chronic kidney disease), stage III ([place]) - Plan: CBC with  Differential/Platelet, Basic metabolic panel  Mild nonproliferative diabetic retinopathy of both eyes without macular edema associated with type 2 diabetes mellitus ([place])  Controlled type 2 diabetes mellitus with retinopathy, without long-term current use of insulin, macular edema presence unspecified, unspecified laterality, unspecified retinopathy severity ([place]) - Plan: Hemoglobin A1c  Recurrent major depressive disorder, in full remission ([place])  Return precautions advised.  Garret Reddish, MD

## 2017-06-24 NOTE — Patient Instructions (Signed)
4 months if a1c above 7.5. Lets do 6 months if under 7.5.   No changes today. Glad you are doing so well.

## 2017-06-24 NOTE — Assessment & Plan Note (Signed)
phq9 of 3-4. Doing really well with pristiq 100mg  and wellbutrin 100mg .

## 2017-06-25 ENCOUNTER — Telehealth: Payer: Self-pay

## 2017-06-25 NOTE — Telephone Encounter (Signed)
Called patient and asked about labs results. Patient verbalized understanding and we scheduled him for his physical

## 2017-07-04 LAB — BASIC METABOLIC PANEL
BUN: 22 — AB (ref 4–21)
CREATININE: 1.3 (ref 0.6–1.3)
Glucose: 279
POTASSIUM: 5.4 — AB (ref 3.4–5.3)
SODIUM: 137 (ref 137–147)

## 2017-07-04 LAB — HEPATIC FUNCTION PANEL
ALK PHOS: 56 (ref 25–125)
ALT: 19 (ref 10–40)
AST: 17 (ref 14–40)
Bilirubin, Total: 0.3

## 2017-07-04 LAB — CBC AND DIFFERENTIAL
HCT: 44 (ref 41–53)
Hemoglobin: 15.1 (ref 13.5–17.5)
Platelets: 705 — AB (ref 150–399)
WBC: 5.8

## 2017-07-12 ENCOUNTER — Other Ambulatory Visit: Payer: Self-pay | Admitting: Family Medicine

## 2017-07-14 IMAGING — NM NM MYOCAR MULTI W/SPECT W/WALL MOTION & EF
2 series · 12 of 12 positions shown · non-contrast
Comparison: none

[Series 1: rest · 8.28mm/px · 6 of 64 frames shown]
[frame 6/64]
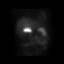
[frame 16/64]
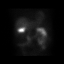
[frame 27/64]
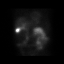
[frame 38/64]
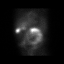
[frame 48/64]
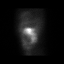
[frame 59/64]
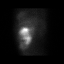

[Series 2: stress gated · 8.28mm/px · 6 of 64 frames shown]
[frame 6/64]
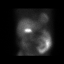
[frame 16/64]
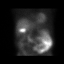
[frame 27/64]
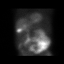
[frame 38/64]
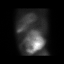
[frame 48/64]
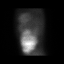
[frame 59/64]
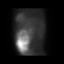

[12 of 12 positions shown; findings below may reference images not displayed]

Canned report from images found in remote index.

Refer to host system for actual result text.

## 2017-07-15 ENCOUNTER — Telehealth: Payer: Self-pay | Admitting: Family Medicine

## 2017-07-15 ENCOUNTER — Other Ambulatory Visit: Payer: Self-pay | Admitting: Family Medicine

## 2017-07-15 NOTE — Telephone Encounter (Signed)
Called pharmacy and confirmed that they received prescription this morning

## 2017-07-15 NOTE — Telephone Encounter (Unsigned)
Copied from [place]. Topic: Quick Communication - See Telephone Encounter >> Jul 15, 2017  2:04 PM Hewitt Shorts wrote: Pt is needing to check on the status of his tramadal refill request   Best number [phone]

## 2017-07-16 NOTE — Telephone Encounter (Signed)
Called patient; made aware RX at pharmacy.

## 2017-07-22 ENCOUNTER — Encounter: Payer: Self-pay | Admitting: Family Medicine

## 2017-08-26 ENCOUNTER — Other Ambulatory Visit: Payer: Self-pay | Admitting: Family Medicine

## 2017-08-26 DIAGNOSIS — I10 Essential (primary) hypertension: Secondary | ICD-10-CM

## 2017-09-10 ENCOUNTER — Other Ambulatory Visit: Payer: Self-pay | Admitting: Family Medicine

## 2017-09-10 DIAGNOSIS — F329 Major depressive disorder, single episode, unspecified: Secondary | ICD-10-CM

## 2017-09-10 DIAGNOSIS — F32A Depression, unspecified: Secondary | ICD-10-CM

## 2017-10-31 ENCOUNTER — Ambulatory Visit: Payer: Medicare Other

## 2017-11-19 NOTE — Progress Notes (Signed)
Subjective:   Chase Anderson is a 72 y.o. male who presents for Medicare Annual/Subsequent preventive examination.  Reports health as Psoriatic arthritis  Patient sees Dr. Amil Amen and remains on remicade. Also sees [facility] for immunodeficiency.   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    MEDS Pristiq stopped this due to se Felt overwhelmed and did not feel good  States in the am; the wellbutrin has helped as he is not depressed every am. The Pristig made him feel it was slowing him down Declined apt for today - thought he was seeing Dr. Yong Channel today    There are no preventive care reminders to display for this patient.  Will postpone flu until he sees [facility] doctor in [place]   10/15/2016 checks Dr. Jorja Loa in [place] No diabetic retinopathy  He is contacted annually and feels it has been 6 months  Has had his shingrix vaccinations; will get the date of the last one at his next office visit  States he had shingles after the shot  Hx of hx of shingles x 8 ( immune system issue )  t helper cells do not work per his report    Cardiac Risk Factors include: advanced age (>38men, >2 women);diabetes mellitus;dyslipidemia;family history of premature cardiovascular disease;hypertension;male gender         Objective:    Vitals: BP 132/78   Pulse 98   Ht 5\' 11"  (1.803 m)   Wt 180 lb (81.6 kg)   SpO2 98%   BMI 25.10 kg/m   Body mass index is 25.1 kg/m.  Advanced Directives 11/20/2017 10/30/2016 08/03/2015  Does Patient Have a Medical Advance Directive? No No No  Would patient like information on creating a medical advance directive? - - No - patient declined information   fdoes not have one Declines information today    Tobacco Social History   Tobacco Use  Smoking Status Former Smoker  . Packs/day: 2.00  . Years: 30.00  . Pack years: 60.00  . Types: Cigarettes  . Last attempt to quit: 12/09/2001  . Years since quitting: 15.9  Smokeless Tobacco Former Systems developer  . Types: Chew  . Quit date: 03/11/2002     Counseling given: Yes declines any fup for LDCT  Clinical Intake:     Past Medical History:  Diagnosis Date  . Allergic rhinitis   . Anxiety   . CAD (coronary artery disease)   . Collagen vascular disease ([place])   . Dermatitis    other atopic  . Diabetes mellitus    type II  . Erectile dysfunction   . Fatigue   . Hyperlipidemia   . Hypertension   . Leukopenia   . Myocardial infarction ([place])   . Osteoarthritis    Past Surgical History:  Procedure Laterality Date  . ESOPHAGOGASTRODUODENOSCOPY  04/23/2005  . KNEE ARTHROSCOPY     right  . TONSILLECTOMY     No family history on file. Social History   Socioeconomic History  . Marital status: Married    Spouse name: Not on file  . Number of children: Not on file  . Years of education: Not on file  . Highest education  level: Not on file  Occupational History  . Not on file  Social Needs  . Financial resource strain: Not on file  . Food insecurity:    Worry: Not on file    Inability: Not on file  . Transportation needs:    Medical: Not on file    Non-medical: Not on file  Tobacco Use  . Smoking status: Former Smoker    Packs/day: 2.00    Years: 30.00    Pack years: 60.00    Types: Cigarettes    Last attempt to quit: 12/09/2001    Years since quitting: 15.9  . Smokeless tobacco: Former Systems developer    Types: [place] date: 03/11/2002  Substance and Sexual Activity  . Alcohol use: No    Alcohol/week: 0.0 standard drinks  . Drug use: No  . Sexual activity: Not on file  Lifestyle  . Physical activity:    Days per week: Not on file    Minutes per session: Not on file  . Stress: Not on file  Relationships  . Social  connections:    Talks on phone: Not on file    Gets together: Not on file    Attends religious service: Not on file    Active member of club or organization: Not on file    Attends meetings of clubs or organizations: Not on file    Relationship status: Not on file  Other Topics Concern  . Not on file  Social History Narrative   Married. 2 children. 2 stepchildren. 7 grandkids including step   Take care of daughter of law Clance Baquero- son left her with 4 year old.    need to go through most history about social      35 years [place] [employer]      Hobbies: golfer, gardening    Outpatient Encounter Medications as of 11/20/2017  Medication Sig  . amLODipine (NORVASC) 10 MG tablet Take 1 tablet (10 mg total) by mouth daily.  Marland Kitchen amLODipine (NORVASC) 10 MG tablet TAKE 1 TABLET BY MOUTH EVERY DAY  . aspirin EC 81 MG tablet Take 81 mg by mouth daily.  . BD ULTRA-FINE LANCETS lancets Use once daily for glucose control  . buPROPion (WELLBUTRIN SR) 150 MG 12 hr tablet TAKE 1 TABLET BY MOUTH TWICE DAILY  . clindamycin (CLEOCIN) 300 MG capsule Take 300 mg by mouth 3 (three) times daily.  . fluconazole (DIFLUCAN) 100 MG tablet Take 100 mg by mouth daily as needed.   . fluocinonide ointment (LIDEX) 0.05 % APPLY TOPICALLY ONCE DAILY AS NEEDED  . glimepiride (AMARYL) 2 MG tablet Take 1 tablet (2 mg total) by mouth daily before breakfast.  . glucose blood (ACCU-CHEK AVIVA PLUS) test strip Use to check blood sugar daily E11.9  . InFLIXimab (REMICADE IV) Inject into the vein every 8 (eight) weeks.   . metFORMIN (GLUCOPHAGE-XR) 500 MG 24 hr tablet Take 2 tablets (1,000 mg total) by mouth daily with breakfast.  . niacin 500 MG tablet Take 500 mg by mouth daily. Take one tab once daily   . nitroGLYCERIN (NITROSTAT) 0.4 MG SL tablet Place 0.4 mg under the tongue every 5 (five) minutes as needed for chest pain. Up to three time and then call the doctor  . sildenafil (REVATIO) 20 MG tablet Take 2-5 tablets as needed  once every 48 hours for erectile dysfunction  . sulfamethoxazole-trimethoprim (BACTRIM DS,SEPTRA DS) 800-160 MG tablet Take 1 tablet by mouth every Monday, Wednesday, and Friday.  Marland Kitchen  traMADol (ULTRAM) 50 MG tablet TAKE 1 TABLET BY MOUTH TWICE DAILY AS NEEDED  . valACYclovir (VALTREX) 500 MG tablet Take 1 tablet (500 mg total) by mouth as needed. (Patient taking differently: Take 500 mg by mouth 3 (three) times daily. )  . desvenlafaxine (PRISTIQ) 50 MG 24 hr tablet Take 1 tablet (50 mg total) by mouth daily. (Patient not taking: Reported on 11/20/2017)  . simvastatin (ZOCOR) 20 MG tablet Take 1 tablet (20 mg total) by mouth daily.   No facility-administered encounter medications on file as of 11/20/2017.     Activities of Daily Living In your present state of health, do you have any difficulty performing the following activities: 11/20/2017  Hearing? N  Comment minor  Vision? Y  Difficulty concentrating or making decisions? N  Walking or climbing stairs? N  Dressing or bathing? N  Doing errands, shopping? N  Preparing Food and eating ? N  Using the Toilet? N  In the past six months, have you accidently leaked urine? N  Do you have problems with loss of bowel control? N  Managing your Medications? N  Managing your Finances? N  Housekeeping or managing your Housekeeping? N  Some recent data might be hidden    Patient Care Team: Marin Olp, MD as PCP - General (Family Medicine) Unice Bailey, MD as Consulting Physician (Rheumatology)   Assessment:   This is a routine wellness examination for [place].  Exercise Activities and Dietary recommendations Current Exercise Habits: Home exercise routine, Intensity: Moderate  Goals    . Exercise 150 minutes per week (moderate activity)     Will continue to exercise x 4 days a week        Fall Risk Fall Risk  11/20/2017 10/30/2016 09/27/2016 09/15/2015 02/06/2015  Falls in the past year? No Yes Yes No No  Number falls in past yr:  - 1 1 - -  Injury with Fall? - - No - -  Follow up - Education provided - - -     Depression Screen PHQ 2/9 Scores 11/20/2017 06/24/2017 12/06/2016 09/27/2016  PHQ - 2 Score 0 1 1 3   PHQ- 9 Score - 3 2 4     Cognitive Function MMSE - Mini Mental State Exam 11/20/2017 10/30/2016  Not completed: (No Data) (No Data)     Ad8 score reviewed for issues:  Issues making decisions:  Less interest in hobbies / activities:  Repeats questions, stories (family complaining):  Trouble using ordinary gadgets (microwave, computer, phone):  Forgets the month or year:   Mismanaging finances:   Remembering appts:  Daily problems with thinking and/or memory: Ad8 score is=0        Immunization History  Administered Date(s) Administered  . Influenza Split 01/23/2012, 11/19/2012  . Influenza Whole 11/26/2007, 12/14/2009  . Influenza, High Dose Seasonal PF 02/01/2014, 02/06/2015, 02/07/2016, 12/06/2016  . Influenza, Seasonal, Injecte, Preservative Fre 01/05/2009  . Influenza,inj,Quad PF,6+ Mos 11/19/2012  . Influenza-Unspecified 01/05/2009  . Pneumococcal Conjugate-13 06/02/2013  . Pneumococcal Polysaccharide-23 03/11/2004, 06/29/2009  . Td 03/11/2002  . Tetanus 03/10/2013  . Zoster Recombinat (Shingrix) 06/10/2017     Screening Tests Health Maintenance  Topic Date Due  . INFLUENZA VACCINE  12/08/2017 (Originally 10/09/2017)  . HEMOGLOBIN A1C  12/24/2017  . URINE MICROALBUMIN  02/20/2018  . OPHTHALMOLOGY EXAM  05/10/2018  . FOOT EXAM  06/25/2018  . COLONOSCOPY  07/15/2018  . TETANUS/TDAP  03/11/2023  . Hepatitis C Screening  Completed  . PNA vac Low Risk Adult  Addressed       Plan:      PCP Notes   Health Maintenance  Will postpone flu until he sees [facility] doctor in [place] on the 18th of Sept  Has had his shingrix vaccinations; will get the date of the last one at his next office visit  States he had shingles after the shot  Hx of hx of shingles x 8 ( immune system issue )    t helper cells do not work per his report  Former smoker  Discussed at [employer] Declines AAA defer to testing at [facility] (CT scans etc)  LDCT discussed but is past 15 year mark;  Colonoscopy due 2020   Abnormal Screens  none  Depression MEDS Pristiq stopped this due to se Felt overwhelmed and did not feel good  States in the am; the wellbutrin has helped as he is not depressed every am. The Pristig made him feel it was slowing him down Declined apt for today - thought he was seeing Dr. Yong Channel today    Referrals  none  Patient concerns; Patient states he feels issues with balance but overall, is careful. Loves to work in the yard and does do hazardous things at times (with saw etc)   Nurse Concerns; The patient seems to know his conditions and his needs. Declined apt with Dr. Yong Channel for fup on Pristiq, but then asked if he wanted to try something else. States he will wait until Dec to discuss unless he gets worse   Next PCP apt 02/09/2018    I have personally reviewed and noted the following in the patient's chart:   . Medical and social history . Use of alcohol, tobacco or illicit drugs  . Current medications and supplements . Functional ability and status . Nutritional status . Physical activity . Advanced directives . List of other physicians . Hospitalizations, surgeries, and ER visits in previous 12 months . Vitals . Screenings to include cognitive, depression, and falls . Referrals and appointments  In addition, I have reviewed and discussed with patient certain preventive protocols, quality metrics, and best practice recommendations. A written personalized care plan for preventive services as well as general preventive health recommendations were provided to patient.     Wynetta Fines, RN  11/20/2017

## 2017-11-20 ENCOUNTER — Ambulatory Visit (INDEPENDENT_AMBULATORY_CARE_PROVIDER_SITE_OTHER): Payer: Medicare Other

## 2017-11-20 VITALS — BP 132/78 | HR 98 | Ht 71.0 in | Wt 180.0 lb

## 2017-11-20 DIAGNOSIS — Z0000 Encounter for general adult medical examination without abnormal findings: Secondary | ICD-10-CM

## 2017-11-20 NOTE — Progress Notes (Signed)
I have reviewed and agree with note, evaluation, plan. I appreciate him being offered sooner follow up for depression given intolerance of pristiq- I am glad he agrees to come in if symptoms worsening.   Garret Reddish, MD

## 2017-11-20 NOTE — Patient Instructions (Addendum)
Chase Anderson , Thank you for taking time to come for your Medicare Wellness Visit. I appreciate your ongoing commitment to your health goals. Please review the following plan we discussed and let me know if I can assist you in the future.   Will take his flu vaccine at [facility] with ID  Men Ages 25 to 35 Years who Have Ever Smoked  The USPSTF recommends one-time screening for abdominal aortic aneurysm (AAA) with ultrasonography in men ages 52 to 32 years who have ever smoked.   If you need to talk to dr. Yong Channel about wellbutrin or other med, please make an apt   Please get your last shingrix date from [place] at your next office visit   If you can, ask Dr. Jorja Loa to fax the office a copy of your eye exam  Chase Anderson will postpone for now, due to your last eye exam being 6 months. No Diabetic retinopathy   These are the goals we discussed: Goals    . Exercise 150 minutes per week (moderate activity)     Will continue to exercise x 4 days a week        This is a list of the screening recommended for you and due dates:  Health Maintenance  Topic Date Due  . Flu Shot  10/09/2017  . Eye exam for diabetics  10/15/2017  . Hemoglobin A1C  12/24/2017  . Urine Protein Check  02/20/2018  . Complete foot exam   06/25/2018  . Colon Cancer Screening  07/15/2018  . Tetanus Vaccine  03/11/2023  .  Hepatitis C: One time screening is recommended by Center for Disease Control  (CDC) for  adults born from [birth year] through 1965.   Completed  . Pneumonia vaccines  Addressed      Fall Prevention in the Home Falls can cause injuries. They can happen to people of all ages. There are many things you can do to make your home safe and to help prevent falls. What can I do on the outside of my home?  Regularly fix the edges of walkways and driveways and fix any cracks.  Remove anything that might make you trip as you walk through a door, such as a raised step or threshold.  Trim any bushes or trees on  the path to your home.  Use bright outdoor lighting.  Clear any walking paths of anything that might make someone trip, such as rocks or tools.  Regularly check to see if handrails are loose or broken. Make sure that both sides of any steps have handrails.  Any raised decks and porches should have guardrails on the edges.  Have any leaves, snow, or ice cleared regularly.  Use sand or salt on walking paths during winter.  Clean up any spills in your garage right away. This includes oil or grease spills. What can I do in the bathroom?  Use night lights.  Install grab bars by the toilet and in the tub and shower. Do not use towel bars as grab bars.  Use non-skid mats or decals in the tub or shower.  If you need to sit down in the shower, use a plastic, non-slip stool.  Keep the floor dry. Clean up any water that spills on the floor as soon as it happens.  Remove soap buildup in the tub or shower regularly.  Attach bath mats securely with double-sided non-slip rug tape.  Do not have throw rugs and other things on the floor that can make  you trip. What can I do in the bedroom?  Use night lights.  Make sure that you have a light by your bed that is easy to reach.  Do not use any sheets or blankets that are too big for your bed. They should not hang down onto the floor.  Have a firm chair that has side arms. You can use this for support while you get dressed.  Do not have throw rugs and other things on the floor that can make you trip. What can I do in the kitchen?  Clean up any spills right away.  Avoid walking on wet floors.  Keep items that you use a lot in easy-to-reach places.  If you need to reach something above you, use a strong step stool that has a grab bar.  Keep electrical cords out of the way.  Do not use floor polish or wax that makes floors slippery. If you must use wax, use non-skid floor wax.  Do not have throw rugs and other things on the floor that  can make you trip. What can I do with my stairs?  Do not leave any items on the stairs.  Make sure that there are handrails on both sides of the stairs and use them. Fix handrails that are broken or loose. Make sure that handrails are as long as the stairways.  Check any carpeting to make sure that it is firmly attached to the stairs. Fix any carpet that is loose or worn.  Avoid having throw rugs at the top or bottom of the stairs. If you do have throw rugs, attach them to the floor with carpet tape.  Make sure that you have a light switch at the top of the stairs and the bottom of the stairs. If you do not have them, ask someone to add them for you. What else can I do to help prevent falls?  Wear shoes that: ? Do not have high heels. ? Have rubber bottoms. ? Are comfortable and fit you well. ? Are closed at the toe. Do not wear sandals.  If you use a stepladder: ? Make sure that it is fully opened. Do not climb a closed stepladder. ? Make sure that both sides of the stepladder are locked into place. ? Ask someone to hold it for you, if possible.  Clearly mark and make sure that you can see: ? Any grab bars or handrails. ? First and last steps. ? Where the edge of each step is.  Use tools that help you move around (mobility aids) if they are needed. These include: ? Canes. ? Walkers. ? Scooters. ? Crutches.  Turn on the lights when you go into a dark area. Replace any light bulbs as soon as they burn out.  Set up your furniture so you have a clear path. Avoid moving your furniture around.  If any of your floors are uneven, fix them.  If there are any pets around you, be aware of where they are.  Review your medicines with your doctor. Some medicines can make you feel dizzy. This can increase your chance of falling. Ask your doctor what other things that you can do to help prevent falls. This information is not intended to replace advice given to you by your health care  provider. Make sure you discuss any questions you have with your health care provider. Document Released: 12/22/2008 Document Revised: 08/03/2015 Document Reviewed: 04/01/2014 Elsevier Interactive Patient Education  [employer].  Health Maintenance, Male A healthy lifestyle and preventive care is important for your health and wellness. Ask your health care provider about what schedule of regular examinations is right for you. What should I know about weight and diet? Eat a Healthy Diet  Eat plenty of vegetables, fruits, whole grains, low-fat dairy products, and lean protein.  Do not eat a lot of foods high in solid fats, added sugars, or salt.  Maintain a Healthy Weight Regular exercise can help you achieve or maintain a healthy weight. You should:  Do at least 150 minutes of exercise each week. The exercise should increase your heart rate and make you sweat (moderate-intensity exercise).  Do strength-training exercises at least twice a week.  Watch Your Levels of Cholesterol and Blood Lipids  Have your blood tested for lipids and cholesterol every 5 years starting at 73 years of age. If you are at high risk for heart disease, you should start having your blood tested when you are 73 years old. You may need to have your cholesterol levels checked more often if: ? Your lipid or cholesterol levels are high. ? You are older than 73 years of age. ? You are at high risk for heart disease.  What should I know about cancer screening? Many types of cancers can be detected early and may often be prevented. Lung Cancer  You should be screened every year for lung cancer if: ? You are a current smoker who has smoked for at least 30 years. ? You are a former smoker who has quit within the past 15 years.  Talk to your health care provider about your screening options, when you should start screening, and how often you should be screened.  Colorectal Cancer  Routine colorectal cancer  screening usually begins at 73 years of age and should be repeated every 5-10 years until you are 73 years old. You may need to be screened more often if early forms of precancerous polyps or small growths are found. Your health care provider may recommend screening at an earlier age if you have risk factors for colon cancer.  Your health care provider may recommend using home test kits to check for hidden blood in the stool.  A small camera at the end of a tube can be used to examine your colon (sigmoidoscopy or colonoscopy). This checks for the earliest forms of colorectal cancer.  Prostate and Testicular Cancer  Depending on your age and overall health, your health care provider may do certain tests to screen for prostate and testicular cancer.  Talk to your health care provider about any symptoms or concerns you have about testicular or prostate cancer.  Skin Cancer  Check your skin from head to toe regularly.  Tell your health care provider about any new moles or changes in moles, especially if: ? There is a change in a mole's size, shape, or color. ? You have a mole that is larger than a pencil eraser.  Always use sunscreen. Apply sunscreen liberally and repeat throughout the day.  Protect yourself by wearing long sleeves, pants, a wide-brimmed hat, and sunglasses when outside.  What should I know about heart disease, diabetes, and high blood pressure?  If you are 73-53 years of age, have your blood pressure checked every 3-5 years. If you are 55 years of age or older, have your blood pressure checked every year. You should have your blood pressure measured twice-once when you are at a hospital or clinic, and once when  you are not at a hospital or clinic. Record the average of the two measurements. To check your blood pressure when you are not at a hospital or clinic, you can use: ? An automated blood pressure machine at a pharmacy. ? A home blood pressure monitor.  Talk to your  health care provider about your target blood pressure.  If you are between 59-82 years old, ask your health care provider if you should take aspirin to prevent heart disease.  Have regular diabetes screenings by checking your fasting blood sugar level. ? If you are at a normal weight and have a low risk for diabetes, have this test once every three years after the age of 56. ? If you are overweight and have a high risk for diabetes, consider being tested at a younger age or more often.  A one-time screening for abdominal aortic aneurysm (AAA) by ultrasound is recommended for men aged 24-75 years who are current or former smokers. What should I know about preventing infection? Hepatitis B If you have a higher risk for hepatitis B, you should be screened for this virus. Talk with your health care provider to find out if you are at risk for hepatitis B infection. Hepatitis C Blood testing is recommended for:  Everyone born from [birth year] through 1966.  Anyone with known risk factors for hepatitis C.  Sexually Transmitted Diseases (STDs)  You should be screened each year for STDs including gonorrhea and chlamydia if: ? You are sexually active and are younger than 73 years of age. ? You are older than 73 years of age and your health care provider tells you that you are at risk for this type of infection. ? Your sexual activity has changed since you were last screened and you are at an increased risk for chlamydia or gonorrhea. Ask your health care provider if you are at risk.  Talk with your health care provider about whether you are at high risk of being infected with HIV. Your health care provider may recommend a prescription medicine to help prevent HIV infection.  What else can I do?  Schedule regular health, dental, and eye exams.  Stay current with your vaccines (immunizations).  Do not use any tobacco products, such as cigarettes, chewing tobacco, and e-cigarettes. If you need help  quitting, ask your health care provider.  Limit alcohol intake to no more than 2 drinks per day. One drink equals 12 ounces of beer, 5 ounces of wine, or 1 ounces of hard liquor.  Do not use street drugs.  Do not share needles.  Ask your health care provider for help if you need support or information about quitting drugs.  Tell your health care provider if you often feel depressed.  Tell your health care provider if you have ever been abused or do not feel safe at home. This information is not intended to replace advice given to you by your health care provider. Make sure you discuss any questions you have with your health care provider. Document Released: 08/24/2007 Document Revised: 10/25/2015 Document Reviewed: 11/29/2014 Elsevier Interactive Patient Education  2018 [employer].   Hearing Loss Hearing loss is a partial or total loss of the ability to hear. This can be temporary or permanent, and it can happen in one or both ears. Hearing loss may be referred to as deafness. Medical care is necessary to treat hearing loss properly and to prevent the condition from getting worse. Your hearing may partially or completely come back,  depending on what caused your hearing loss and how severe it is. In some cases, hearing loss is permanent. What are the causes? Common causes of hearing loss include:  Too much wax in the ear canal.  Infection of the ear canal or middle ear.  Fluid in the middle ear.  Injury to the ear or surrounding area.  An object stuck in the ear.  Prolonged exposure to loud sounds, such as music.  Less common causes of hearing loss include:  Tumors in the ear.  Viral or bacterial infections, such as meningitis.  A hole in the eardrum (perforated eardrum).  Problems with the hearing nerve that sends signals between the brain and the ear.  Certain medicines.  What are the signs or symptoms? Symptoms of this condition may include:  Difficulty  telling the difference between sounds.  Difficulty following a conversation when there is background noise.  Lack of response to sounds in your environment. This may be most noticeable when you do not respond to startling sounds.  Needing to turn up the volume on the television, radio, etc.  Ringing in the ears.  Dizziness.  Pain in the ears.  How is this diagnosed? This condition is diagnosed based on a physical exam and a hearing test (audiometry). The audiometry test will be performed by a hearing specialist (audiologist). You may also be referred to an ear, nose, and throat (ENT) specialist (otolaryngologist). How is this treated? Treatment for recent onset of hearing loss may include:  Ear wax removal.  Being prescribed medicines to prevent infection (antibiotics).  Being prescribed medicines to reduce inflammation (corticosteroids).  Follow these instructions at home:  If you were prescribed an antibiotic medicine, take it as told by your health care provider. Do not stop taking the antibiotic even if you start to feel better.  Take over-the-counter and prescription medicines only as told by your health care provider.  Avoid loud noises.  Return to your normal activities as told by your health care provider. Ask your health care provider what activities are safe for you.  Keep all follow-up visits as told by your health care provider. This is important. Contact a health care provider if:  You feel dizzy.  You develop new symptoms.  You vomit or feel nauseous.  You have a fever. Get help right away if:  You develop sudden changes in your vision.  You have severe ear pain.  You have new or increased weakness.  You have a severe headache. This information is not intended to replace advice given to you by your health care provider. Make sure you discuss any questions you have with your health care provider. Document Released: 02/25/2005 Document Revised:  08/03/2015 Document Reviewed: 07/13/2014 Elsevier Interactive Patient Education  2018 [employer].

## 2017-11-24 ENCOUNTER — Other Ambulatory Visit: Payer: Self-pay | Admitting: Family Medicine

## 2017-11-24 DIAGNOSIS — E7800 Pure hypercholesterolemia, unspecified: Secondary | ICD-10-CM

## 2017-12-17 LAB — CBC AND DIFFERENTIAL
HEMATOCRIT: 47 (ref 41–53)
Hemoglobin: 15.8 (ref 13.5–17.5)
PLATELETS: 817 — AB (ref 150–399)
WBC: 7.1

## 2017-12-17 LAB — HEPATIC FUNCTION PANEL
ALK PHOS: 64 (ref 25–125)
ALT: 27 (ref 10–40)
AST: 26 (ref 14–40)
BILIRUBIN, TOTAL: 0.3

## 2017-12-17 LAB — BASIC METABOLIC PANEL
BUN: 19 (ref 4–21)
CREATININE: 1.4 — AB (ref 0.6–1.3)
Glucose: 208
Potassium: 5.5 — AB (ref 3.4–5.3)
Sodium: 139 (ref 137–147)

## 2017-12-22 ENCOUNTER — Encounter: Payer: Self-pay | Admitting: Family Medicine

## 2018-02-09 ENCOUNTER — Ambulatory Visit (INDEPENDENT_AMBULATORY_CARE_PROVIDER_SITE_OTHER): Payer: Medicare Other | Admitting: Family Medicine

## 2018-02-09 ENCOUNTER — Encounter: Payer: Self-pay | Admitting: Family Medicine

## 2018-02-09 VITALS — BP 136/76 | HR 86 | Temp 97.8°F | Ht 71.0 in | Wt 186.0 lb

## 2018-02-09 DIAGNOSIS — F528 Other sexual dysfunction not due to a substance or known physiological condition: Secondary | ICD-10-CM

## 2018-02-09 DIAGNOSIS — Z6825 Body mass index (BMI) 25.0-25.9, adult: Secondary | ICD-10-CM

## 2018-02-09 DIAGNOSIS — D473 Essential (hemorrhagic) thrombocythemia: Secondary | ICD-10-CM

## 2018-02-09 DIAGNOSIS — E11319 Type 2 diabetes mellitus with unspecified diabetic retinopathy without macular edema: Secondary | ICD-10-CM

## 2018-02-09 DIAGNOSIS — N183 Chronic kidney disease, stage 3 unspecified: Secondary | ICD-10-CM

## 2018-02-09 DIAGNOSIS — D75839 Thrombocytosis, unspecified: Secondary | ICD-10-CM

## 2018-02-09 DIAGNOSIS — Z87891 Personal history of nicotine dependence: Secondary | ICD-10-CM | POA: Diagnosis not present

## 2018-02-09 DIAGNOSIS — E113293 Type 2 diabetes mellitus with mild nonproliferative diabetic retinopathy without macular edema, bilateral: Secondary | ICD-10-CM

## 2018-02-09 DIAGNOSIS — Z0000 Encounter for general adult medical examination without abnormal findings: Secondary | ICD-10-CM | POA: Diagnosis not present

## 2018-02-09 DIAGNOSIS — L4050 Arthropathic psoriasis, unspecified: Secondary | ICD-10-CM

## 2018-02-09 DIAGNOSIS — F3342 Major depressive disorder, recurrent, in full remission: Secondary | ICD-10-CM | POA: Diagnosis not present

## 2018-02-09 LAB — COMPREHENSIVE METABOLIC PANEL
ALT: 24 U/L (ref 0–53)
AST: 17 U/L (ref 0–37)
Albumin: 4.6 g/dL (ref 3.5–5.2)
Alkaline Phosphatase: 49 U/L (ref 39–117)
BUN: 18 mg/dL (ref 6–23)
CO2: 24 mEq/L (ref 19–32)
Calcium: 10 mg/dL (ref 8.4–10.5)
Chloride: 104 mEq/L (ref 96–112)
Creatinine, Ser: 1.39 mg/dL (ref 0.40–1.50)
GFR: 53.24 mL/min — ABNORMAL LOW (ref >60.00)
Glucose, Bld: 193 mg/dL — ABNORMAL HIGH (ref 70–99)
Potassium: 4.7 mEq/L (ref 3.5–5.1)
Sodium: 137 mEq/L (ref 135–145)
Total Bilirubin: 0.5 mg/dL (ref 0.2–1.2)
Total Protein: 6.7 g/dL (ref 6.0–8.3)

## 2018-02-09 LAB — POCT GLYCOSYLATED HEMOGLOBIN (HGB A1C): HEMOGLOBIN A1C: 7 % — AB (ref 4.0–5.6)

## 2018-02-09 LAB — LIPID PANEL
CHOL/HDL RATIO: 4
Cholesterol: 119 mg/dL (ref 0–200)
HDL: 33.4 mg/dL — AB (ref >39.00)
LDL CALC: 57 mg/dL (ref 0–99)
NONHDL: 85.35
Triglycerides: 140 mg/dL (ref 0.0–149.0)
VLDL: 28 mg/dL (ref 0.0–40.0)

## 2018-02-09 LAB — MICROALBUMIN / CREATININE URINE RATIO
Creatinine,U: 71.7 mg/dL
MICROALB UR: 5.5 mg/dL — AB (ref 0.0–1.9)
Microalb Creat Ratio: 7.7 mg/g (ref 0.0–30.0)

## 2018-02-09 LAB — URINALYSIS
Bilirubin Urine: NEGATIVE
Hgb urine dipstick: NEGATIVE
Ketones, ur: NEGATIVE
Leukocytes, UA: NEGATIVE
Nitrite: NEGATIVE
SPECIFIC GRAVITY, URINE: 1.015 (ref 1.000–1.030)
Total Protein, Urine: NEGATIVE
URINE GLUCOSE: NEGATIVE
Urobilinogen, UA: 0.2 (ref 0.0–1.0)
pH: 5.5 (ref 5.0–8.0)

## 2018-02-09 LAB — CBC
HCT: 49.1 % (ref 39.0–52.0)
Hemoglobin: 16.9 g/dL (ref 13.0–17.0)
MCHC: 34.3 g/dL (ref 30.0–36.0)
MCV: 88.8 fl (ref 78.0–100.0)
Platelets: 817 10*3/uL — ABNORMAL HIGH (ref 150.0–400.0)
RBC: 5.53 Mil/uL (ref 4.22–5.81)
RDW: 14.4 % (ref 11.5–15.5)
WBC: 7.7 10*3/uL (ref 4.0–10.5)

## 2018-02-09 MED ORDER — TADALAFIL 20 MG PO TABS: 20.0000 mg | ORAL_TABLET | Freq: Every day | ORAL | 5 refills | Status: DC | PRN

## 2018-02-09 NOTE — Assessment & Plan Note (Signed)
Thrombocytosis- long-term issues.  Will monitor with CBC- just had in October though- wants repeat today Potentially related to psoriatic arthritis or Remicade.

## 2018-02-09 NOTE — Assessment & Plan Note (Signed)
Depression controlled with  PHQ 9 of 4 on Wellbutrin 150 mg extended release right now. Felt a closing in sensation in head on Pristiq 50 mg and didn't like sexual side effects so he stopped this on his own- fortunately depression remains well enough controlled.

## 2018-02-09 NOTE — Assessment & Plan Note (Addendum)
CKD 3- filtration rate has been around 60.  He knows to avoid NSAIDs. Last creatinine and was 1.4 with [facility]. Didn't tolerate ace-I in past per allergy section. Make sure no microalbumin elevations on urine today.

## 2018-02-09 NOTE — Patient Instructions (Addendum)
Lab Results  Component Value Date   HGBA1C 7.0 (A) 02/09/2018  a1c controlled! Continue current meds  Sign release of information at the check out desk for last eye doctor appointment- last one we have scanned in is from 2018. If you have not had it in 2019- please get this updated and let us know  Please stop by lab before you go

## 2018-02-09 NOTE — Assessment & Plan Note (Signed)
ED- cialis has helped him in the past- wants sent to [place]. Knows not to use within 72 hours of nitrglycerin.

## 2018-02-09 NOTE — Progress Notes (Signed)
Phone: [phone]  Subjective:  Patient presents today for their annual physical. Chief complaint-noted.   See problem oriented charting- ROS- full  review of systems was completed and negative except for: joint pain, down mood at times  The following were reviewed and entered/updated in epic: Past Medical History:  Diagnosis Date  . Allergic rhinitis   . Anxiety   . CAD (coronary artery disease)   . Collagen vascular disease ([place])   . Dermatitis    other atopic  . Diabetes mellitus    type II  . Erectile dysfunction   . Fatigue   . Hyperlipidemia   . Hypertension   . Leukopenia   . Myocardial infarction ([place])   . Osteoarthritis    Patient Active Problem List   Diagnosis Date Noted  . Thrombocytosis ([place]) 04/03/2016    Priority: High  . Depression 03/27/2015    Priority: High  . Psoriatic arthritis ([place]) 01/25/2013    Priority: High  . Mitral valve prolapse 12/25/2010    Priority: High  . Leukocytopenia 11/24/2006    Priority: High  . Controlled type 2 diabetes mellitus with ophthalmic complication ([place]) 44/03/270    Priority: High  . CAD (coronary artery disease) with history MI 2004- no intervention 10/06/2006    Priority: High  . Dyshidrotic eczema 02/07/2016    Priority: Medium  . CKD (chronic kidney disease), stage III ([place]) 09/15/2015    Priority: Medium  . History of BPH 12/14/2009    Priority: Medium  . Hyperlipidemia 10/06/2006    Priority: Medium  . Essential hypertension 10/06/2006    Priority: Medium  . Diabetic retinopathy ([place]) 02/20/2017    Priority: Low  . ERECTILE DYSFUNCTION 11/26/2007    Priority: Low   Past Surgical History:  Procedure Laterality Date  . ESOPHAGOGASTRODUODENOSCOPY  04/23/2005  . KNEE ARTHROSCOPY     right  . TONSILLECTOMY      History reviewed. No pertinent family history.  Medications- reviewed and updated Current Outpatient Medications  Medication Sig Dispense Refill  . amLODipine (NORVASC) 10 MG tablet  TAKE 1 TABLET BY MOUTH EVERY DAY 100 tablet 3  . aspirin EC 81 MG tablet Take 81 mg by mouth daily.    . BD ULTRA-FINE LANCETS lancets Use once daily for glucose control 100 each 3  . buPROPion (WELLBUTRIN SR) 150 MG 12 hr tablet TAKE 1 TABLET BY MOUTH TWICE DAILY 180 tablet 1  . clindamycin (CLEOCIN) 300 MG capsule Take 300 mg by mouth 3 (three) times daily.    . fluconazole (DIFLUCAN) 100 MG tablet Take 100 mg by mouth daily as needed.     . fluocinonide ointment (LIDEX) 0.05 % APPLY TOPICALLY ONCE DAILY AS NEEDED 60 g 3  . glimepiride (AMARYL) 2 MG tablet TAKE 1 TABLET BY MOUTH EVERY DAY BEFORE BREAKFAST 90 tablet 1  . glucose blood (ACCU-CHEK AVIVA PLUS) test strip Use to check blood sugar daily E11.9 100 each 1  . InFLIXimab (REMICADE IV) Inject into the vein every 8 (eight) weeks.     . metFORMIN (GLUCOPHAGE-XR) 500 MG 24 hr tablet Take 2 tablets (1,000 mg total) by mouth daily with breakfast. 180 tablet 3  . niacin 500 MG tablet Take 500 mg by mouth daily. Take one tab once daily     . nitroGLYCERIN (NITROSTAT) 0.4 MG SL tablet Place 0.4 mg under the tongue every 5 (five) minutes as needed for chest pain. Up to three time and then call the doctor    . simvastatin (  ZOCOR) 20 MG tablet TAKE 1 TABLET BY MOUTH EVERY NIGHT AT BEDTIME 100 tablet 1  . sulfamethoxazole-trimethoprim (BACTRIM DS,SEPTRA DS) 800-160 MG tablet Take 1 tablet by mouth every Monday, Wednesday, and Friday.  5  . traMADol (ULTRAM) 50 MG tablet TAKE 1 TABLET BY MOUTH TWICE DAILY AS NEEDED 60 tablet 5  . valACYclovir (VALTREX) 500 MG tablet Take 1 tablet (500 mg total) by mouth as needed. (Patient taking differently: Take 500 mg by mouth 3 (three) times daily. ) 60 tablet 3  . tadalafil (ADCIRCA/CIALIS) 20 MG tablet Take 1 tablet (20 mg total) by mouth daily as needed for erectile dysfunction (no nitroglycerin within 72 hours). 10 tablet 5   No current facility-administered medications for this visit.     Allergies-reviewed  and updated Allergies  Allergen Reactions  . Codeine     REACTION: itching and rash  . Lisinopril   . Penicillins     REACTION: as a child    Social History   Social History Narrative   Married. 2 children. 2 stepchildren. 7 grandkids including step   Take care of daughter of law Garron Eline- son left her with 58 year old.    need to go through most history about social      35 years [employer]      Hobbies: golfer, gardening    Objective: BP 136/76 (BP Location: Left Arm, Patient Position: Sitting, Cuff Size: Large)   Pulse 86   Temp 97.8 F (36.6 C) (Oral)   Ht 5\' 11"  (1.803 m)   Wt 186 lb (84.4 kg)   SpO2 95%   BMI 25.94 kg/m  Gen: NAD, resting comfortably HEENT: Mucous membranes are moist. Oropharynx normal Neck: no thyromegaly CV: RRR 2/6 SEM RUSB. no rubs or gallops Lungs: CTAB no crackles, wheeze, rhonchi Abdomen: soft/nontender/nondistended/normal bowel sounds. No rebound or guarding.  Ext: no edema Skin: warm, dry Neuro: grossly normal, moves all extremities, PERRLA  Assessment/Plan:  72 y.o. male presenting for annual physical.  Health Maintenance counseling: 1. Anticipatory guidance: Patient counseled regarding regular dental exams -q6 months, eye exams -yearly,  avoiding smoking and second hand smoke, limiting alcohol to 2 beverages per day.   2. Risk factor reduction:  Advised patient of need for regular exercise and diet rich and fruits and vegetables to reduce risk of heart attack and stroke. Exercise-in the past patient typically walked 2 miles a day, at present- has not done well recently since loss of dog- he knows he needs to restart. Diet-weight up over thanksgiving- up 6 lbs from last visit- admits heavier on carbs than he should be- he will cut back.  Wt Readings from Last 3 Encounters:  02/09/18 186 lb (84.4 kg)  11/20/17 180 lb (81.6 kg)  06/24/17 186 lb 6.4 oz (84.6 kg)  3. Immunizations/screenings/ancillary studies-flu shot already  had Immunization History  Administered Date(s) Administered  . Influenza Split 01/23/2012, 11/19/2012  . Influenza Whole 11/26/2007, 12/14/2009  . Influenza, High Dose Seasonal PF 02/01/2014, 02/06/2015, 02/07/2016, 12/06/2016  . Influenza, Seasonal, Injecte, Preservative Fre 01/05/2009  . Influenza,inj,Quad PF,6+ Mos 11/19/2012  . Influenza-Unspecified 01/05/2009, 11/27/2017  . Pneumococcal Conjugate-13 06/02/2013  . Pneumococcal Polysaccharide-23 03/11/2004, 06/29/2009  . Td 03/11/2002  . Tetanus 03/10/2013  . Zoster Recombinat (Shingrix) 06/10/2017, 10/01/2017   4. Prostate cancer screening- past age based screening recommendations per [place] PTF- prior PSA trend low risk- recommended no screening today and patient agreeable. No big change in urinary symptoms  Lab Results  Component  Value Date   PSA 0.67 02/07/2016   PSA 0.54 02/06/2015   PSA 0.56 02/01/2014   5. Colon cancer screening - 2010 with 10-year repeat  6. Skin cancer screening-sees dermatologist as needed- no recent issues.advised regular sunscreen use- not using. Denies worrisome, changing, or new skin lesions.  7.  Former smoker- 60 pack years but quit in 2003- 2005 CT scan abdomen without obvious AAA- no further screening. Will get urine with labs   Status of chronic or acute concerns   Coronary artery disease- follows with cardiology- last seen 04/2017.  Not having to use nitroglycerin.  Compliant with aspirin and simvastatin 20 mg.   Hyperlipidemia-on the simvastatin 20 mg LDL has been under 70-update again today.  He also takes niacin-though no mortality benefit   Mitral valve prolapse (not mentioned on recent echo- just mild regurgitation- last done 05/2017. did have some calcium onjaortic valve which could cause murmur  Hypertension- controlled on amlodipine 10 mg alone   Depression Depression controlled with  PHQ 9 of 4 on Wellbutrin 150 mg extended release right now. Felt a closing in sensation in head on Pristiq  50 mg and didn't like sexual side effects so he stopped this on his own- fortunately depression remains well enough controlled.  Psoriatic arthritis ([place]) Continues to follow with Dr. Amil Amen and is on Remicade.  He also follows with [facility] for his immunodeficiency (on bactrim and clindamycin prn and well as diflucan through them per their regimen).  I provided his tramadol in past-she is using this twice a day for his psoriatic arthritic pain- looks like Dr. Melissa Noon office is filling this mor erecently.  Encouraged continued rheumatology follow-up  Controlled type 2 diabetes mellitus with ophthalmic complication ([place]) Diabetes-has been controlled on metformin 2000 mg extended release per day- 750 mg times 2 in the morning and 500 mg later in the day in the past- sometime last year we decreased to metformin 1 g/day and added Amaryl 2 mg/day given diarrhea with higher metformin doses Lab Results  Component Value Date   HGBA1C 7.0 (A) 02/09/2018   HGBA1C 6.9 (H) 06/24/2017   HGBA1C 7.1 12/06/2016  CBGs- fasting numbers last year from 1 20-1 67, at present- 120 to 140 as well and no low blood sugars  Diabetic complications- does have mild nonproliferative diabetic retinopathy and follows with myeyedoctor - encouraged follow up today as he thinks has been a year - 4 month follow up recommended   Thrombocytosis ([place]) Thrombocytosis- long-term issues.  Will monitor with CBC- just had in October though- wants repeat today Potentially related to psoriatic arthritis or Remicade.  ERECTILE DYSFUNCTION ED- cialis has helped him in the past- wants sent to [facility]. Knows not to use within 72 hours of nitrglycerin.   CKD (chronic kidney disease), stage III (HCC) CKD 3- filtration rate has been around 60.  He knows to avoid NSAIDs. Last creatinine and was 1.4 with [facility]. Didn't tolerate ace-I in past per allergy section. Make sure no microalbumin elevations on urine today.   Future  Appointments  Date Time Provider [place]  11/23/2018 10:00 AM [facility]   4 months written on paperwork/avs  Lab/Order associations:FASTING  Preventative health care - Plan: Lipid panel, Microalbumin / creatinine urine ratio, CANCELED: CBC, CANCELED: Comprehensive metabolic panel  Controlled type 2 diabetes mellitus with retinopathy, without long-term current use of insulin, macular edema presence unspecified, unspecified laterality, unspecified retinopathy severity ([place]) - Plan: Lipid panel, Microalbumin / creatinine  urine ratio, POCT glycosylated hemoglobin (Hb A1C), CBC, Comprehensive metabolic panel, CANCELED: CBC, CANCELED: Comprehensive metabolic panel  CKD (chronic kidney disease), stage III ([place]) - Plan: Urinalysis, CBC, Comprehensive metabolic panel, Urinalysis  Former smoker - Plan: Urinalysis, Urinalysis  Recurrent major depressive disorder, in full remission (HCC)  Psoriatic arthritis (HCC)  Thrombocytosis (HCC)  ERECTILE DYSFUNCTION  Mild nonproliferative diabetic retinopathy of both eyes without macular edema associated with type 2 diabetes mellitus ([place])  Meds ordered this encounter  Medications  . tadalafil (ADCIRCA/CIALIS) 20 MG tablet    Sig: Take 1 tablet (20 mg total) by mouth daily as needed for erectile dysfunction (no nitroglycerin within 72 hours).    Dispense:  10 tablet    Refill:  5    Return precautions advised.  Garret Reddish, MD

## 2018-02-09 NOTE — Assessment & Plan Note (Signed)
Continues to follow with Dr. Amil Amen and is on Remicade.  He also follows with [facility] for his immunodeficiency (on bactrim and clindamycin prn and well as diflucan through them per their regimen).  I provided his tramadol in past-she is using this twice a day for his psoriatic arthritic pain- looks like Dr. Melissa Noon office is filling this mor erecently.  Encouraged continued rheumatology follow-up

## 2018-02-09 NOTE — Assessment & Plan Note (Signed)
Diabetes-has been controlled on metformin 2000 mg extended release per day- 750 mg times 2 in the morning and 500 mg later in the day in the past- sometime last year we decreased to metformin 1 g/day and added Amaryl 2 mg/day given diarrhea with higher metformin doses Lab Results  Component Value Date   HGBA1C 7.0 (A) 02/09/2018   HGBA1C 6.9 (H) 06/24/2017   HGBA1C 7.1 12/06/2016  CBGs- fasting numbers last year from 1 20-1 49, at present- 120 to 140 as well and no low blood sugars  Diabetic complications- does have mild nonproliferative diabetic retinopathy and follows with myeyedoctor - encouraged follow up today as he thinks has been a year - 4 month follow up recommended

## 2018-02-13 ENCOUNTER — Other Ambulatory Visit: Payer: Self-pay

## 2018-02-13 MED ORDER — METFORMIN HCL ER 500 MG PO TB24: 1000.0000 mg | ORAL_TABLET | Freq: Every day | ORAL | 3 refills | Status: DC

## 2018-02-22 ENCOUNTER — Other Ambulatory Visit: Payer: Self-pay | Admitting: Family Medicine

## 2018-02-22 DIAGNOSIS — F32A Depression, unspecified: Secondary | ICD-10-CM

## 2018-02-22 DIAGNOSIS — F329 Major depressive disorder, single episode, unspecified: Secondary | ICD-10-CM

## 2018-04-07 LAB — HM DIABETES EYE EXAM

## 2018-04-14 ENCOUNTER — Encounter: Payer: Self-pay | Admitting: Family Medicine

## 2018-05-23 ENCOUNTER — Other Ambulatory Visit: Payer: Self-pay | Admitting: Family Medicine

## 2018-05-23 DIAGNOSIS — E7800 Pure hypercholesterolemia, unspecified: Secondary | ICD-10-CM

## 2018-06-03 LAB — CBC AND DIFFERENTIAL
HCT: 49 (ref 41–53)
Hemoglobin: 16.4 (ref 13.5–17.5)
Neutrophils Absolute: 7
Platelets: 885 — AB (ref 150–399)
WBC: 8.8

## 2018-06-03 LAB — BASIC METABOLIC PANEL
BUN: 23 — AB (ref 4–21)
Creatinine: 1.6 — AB (ref 0.6–1.3)
Glucose: 175
Potassium: 5.8 — AB (ref 3.4–5.3)
Sodium: 137 (ref 137–147)

## 2018-06-03 LAB — HEPATIC FUNCTION PANEL
ALT: 27 (ref 10–40)
AST: 25 (ref 14–40)
Alkaline Phosphatase: 55 (ref 25–125)
Bilirubin, Total: 0.4

## 2018-06-11 ENCOUNTER — Ambulatory Visit: Payer: Medicare Other | Admitting: Family Medicine

## 2018-06-15 ENCOUNTER — Encounter: Payer: Self-pay | Admitting: Family Medicine

## 2018-08-03 ENCOUNTER — Encounter: Payer: Self-pay | Admitting: Gastroenterology

## 2018-08-21 ENCOUNTER — Other Ambulatory Visit: Payer: Self-pay | Admitting: Family Medicine

## 2018-08-21 DIAGNOSIS — F329 Major depressive disorder, single episode, unspecified: Secondary | ICD-10-CM

## 2018-08-21 DIAGNOSIS — F32A Depression, unspecified: Secondary | ICD-10-CM

## 2018-08-21 NOTE — Telephone Encounter (Signed)
Last OV 02/09/18 f/u 4 mos Last refill 02/24/18 #180/1 Next OV 11/23/18

## 2018-08-26 ENCOUNTER — Encounter: Payer: Self-pay | Admitting: Family Medicine

## 2018-08-26 ENCOUNTER — Ambulatory Visit (INDEPENDENT_AMBULATORY_CARE_PROVIDER_SITE_OTHER): Payer: Medicare Other | Admitting: Family Medicine

## 2018-08-26 VITALS — BP 126/80 | HR 83 | Ht 71.0 in | Wt 185.0 lb

## 2018-08-26 DIAGNOSIS — E11319 Type 2 diabetes mellitus with unspecified diabetic retinopathy without macular edema: Secondary | ICD-10-CM | POA: Diagnosis not present

## 2018-08-26 DIAGNOSIS — F3342 Major depressive disorder, recurrent, in full remission: Secondary | ICD-10-CM | POA: Diagnosis not present

## 2018-08-26 DIAGNOSIS — E1169 Type 2 diabetes mellitus with other specified complication: Secondary | ICD-10-CM

## 2018-08-26 DIAGNOSIS — D473 Essential (hemorrhagic) thrombocythemia: Secondary | ICD-10-CM

## 2018-08-26 DIAGNOSIS — I10 Essential (primary) hypertension: Secondary | ICD-10-CM

## 2018-08-26 DIAGNOSIS — E663 Overweight: Secondary | ICD-10-CM

## 2018-08-26 DIAGNOSIS — E785 Hyperlipidemia, unspecified: Secondary | ICD-10-CM

## 2018-08-26 DIAGNOSIS — E1159 Type 2 diabetes mellitus with other circulatory complications: Secondary | ICD-10-CM | POA: Diagnosis not present

## 2018-08-26 DIAGNOSIS — D75839 Thrombocytosis, unspecified: Secondary | ICD-10-CM

## 2018-08-26 DIAGNOSIS — I152 Hypertension secondary to endocrine disorders: Secondary | ICD-10-CM

## 2018-08-26 DIAGNOSIS — L4050 Arthropathic psoriasis, unspecified: Secondary | ICD-10-CM

## 2018-08-26 MED ORDER — BUPROPION HCL ER (SR) 150 MG PO TB12: 150.0000 mg | ORAL_TABLET | Freq: Two times a day (BID) | ORAL | 3 refills | Status: DC

## 2018-08-26 NOTE — Progress Notes (Signed)
Phone [phone]   Subjective:  Virtual visit via Video note. Chief complaint: Chief Complaint  Patient presents with  . Follow-up  . Depression  . Diabetes    This visit type was conducted due to national recommendations for restrictions regarding the COVID-19 Pandemic (e.g. social distancing).  This format is felt to be most appropriate for this patient at this time balancing risks to patient and risks to population by having him in for in person visit.  No physical exam was performed (except for noted visual exam or audio findings with Telehealth visits).    Our team/I connected with Madelin Headings at  9:20 AM EDT by a video enabled telemedicine application ([email or web address] or caregility through epic) and verified that I am speaking with the correct person using two identifiers.  Location patient: [place] Location provider: [facility], office Persons participating in the virtual visit:  patient  Our team/I discussed the limitations of evaluation and management by telemedicine and the availability of in person appointments. In light of current covid-19 pandemic, patient also understands that we are trying to protect them by minimizing in office contact if at all possible.  The patient expressed consent for telemedicine visit and agreed to proceed. Patient understands insurance will be billed.   ROS- No fever, chills, new cough, new shortness of breath, body aches, sore throat, or loss of taste or smell  Past Medical History-  Patient Active Problem List   Diagnosis Date Noted  . Thrombocytosis ([place]) 04/03/2016    Priority: High  . Depression 03/27/2015    Priority: High  . Psoriatic arthritis ([place]) 01/25/2013    Priority: High  . Mitral valve prolapse 12/25/2010    Priority: High  . Leukocytopenia 11/24/2006    Priority: High  . Controlled type 2 diabetes mellitus with ophthalmic complication ([place]) 82/99/3716    Priority: High  . CAD (coronary artery disease) with history MI  2004- no intervention 10/06/2006    Priority: High  . Dyshidrotic eczema 02/07/2016    Priority: Medium  . CKD (chronic kidney disease), stage III ([place]) 09/15/2015    Priority: Medium  . History of BPH 12/14/2009    Priority: Medium  . Hyperlipidemia associated with type 2 diabetes mellitus ([place]) 10/06/2006    Priority: Medium  . Hypertension associated with diabetes ([place]) 10/06/2006    Priority: Medium  . Diabetic retinopathy ([place]) 02/20/2017    Priority: Low  . ERECTILE DYSFUNCTION 11/26/2007    Priority: Low  . Recurrent major depressive disorder, in full remission ([place]) 08/26/2018   Medications- reviewed and updated Current Outpatient Medications  Medication Sig Dispense Refill  . amLODipine (NORVASC) 10 MG tablet TAKE 1 TABLET BY MOUTH EVERY DAY 100 tablet 3  . aspirin EC 81 MG tablet Take 81 mg by mouth daily.    . BD ULTRA-FINE LANCETS lancets Use once daily for glucose control 100 each 3  . buPROPion (WELLBUTRIN SR) 150 MG 12 hr tablet TAKE 1 TABLET BY MOUTH TWICE DAILY 180 tablet 1  . clindamycin (CLEOCIN) 300 MG capsule Take 300 mg by mouth 3 (three) times daily.    . fluconazole (DIFLUCAN) 100 MG tablet Take 100 mg by mouth daily as needed.     . fluocinonide ointment (LIDEX) 0.05 % APPLY TOPICALLY ONCE DAILY AS NEEDED 60 g 3  . glimepiride (AMARYL) 2 MG tablet TAKE 1 TABLET BY MOUTH EVERY DAY BEFORE BREAKFAST 90 tablet 1  . glucose blood (ACCU-CHEK AVIVA PLUS) test strip Use to check blood sugar  daily E11.9 100 each 1  . InFLIXimab (REMICADE IV) Inject into the vein every 8 (eight) weeks.     . metFORMIN (GLUCOPHAGE-XR) 500 MG 24 hr tablet Take 2 tablets (1,000 mg total) by mouth daily with breakfast. 180 tablet 3  . niacin 500 MG tablet Take 500 mg by mouth daily. Take one tab once daily     . nitroGLYCERIN (NITROSTAT) 0.4 MG SL tablet Place 0.4 mg under the tongue every 5 (five) minutes as needed for chest pain. Up to three time and then call the doctor    .  simvastatin (ZOCOR) 20 MG tablet TAKE 1 TABLET BY MOUTH AT BEDTIME 100 tablet 1  . sulfamethoxazole-trimethoprim (BACTRIM DS,SEPTRA DS) 800-160 MG tablet Take 1 tablet by mouth every Monday, Wednesday, and Friday.  5  . tadalafil (ADCIRCA/CIALIS) 20 MG tablet Take 1 tablet (20 mg total) by mouth daily as needed for erectile dysfunction (no nitroglycerin within 72 hours). 10 tablet 5  . traMADol (ULTRAM) 50 MG tablet TAKE 1 TABLET BY MOUTH TWICE DAILY AS NEEDED 60 tablet 5  . valACYclovir (VALTREX) 500 MG tablet Take 1 tablet (500 mg total) by mouth as needed. (Patient taking differently: Take 500 mg by mouth 3 (three) times daily. ) 60 tablet 3   No current facility-administered medications for this visit.      Objective:  BP 126/80   Pulse 83   Ht 5\' 11"  (1.803 m)   Wt 185 lb (83.9 kg)   BMI 25.80 kg/m  self reported vitals Gen: NAD, resting comfortably Lungs: nonlabored, normal respiratory rate  Skin: appears dry, no obvious rash    Assessment and Plan   #hypertension S: controlled on Amlodipine 10 mg daily. Not checking BP at home. Denies HA, dizziness, visual changes. Following low sodium diet. Denies ankle swelling BP Readings from Last 3 Encounters:  08/26/18 126/80  02/09/18 136/76  11/20/17 132/78  A/P: Stable. Continue current medications.   #hyperlipidemia S:  controlled on Simvastatin 20 mg daily. Denies side effetcts.  Lab Results  Component Value Date   CHOL 119 02/09/2018   HDL 33.40 (L) 02/09/2018   LDLCALC 57 02/09/2018   TRIG 140.0 02/09/2018   CHOLHDL 4 02/09/2018   A/P: Stable. Continue current medications.   # Diabetes S: previously controlled on Glimepiride 2 mg and Metformin XR 500 mg 2 tablets daily.  CBGs- Checks BG occasionally. Last BG was 134 fasting. Denies hypoglycemic episodes.  Exercise and diet- Goes for 1 mile walk almost daily when not raining. Watching what he eats, trying to eat low carb/low sugar. Walking new dog.  Lab Results   Component Value Date   HGBA1C 7.0 (A) 02/09/2018   HGBA1C 6.9 (H) 06/24/2017   HGBA1C 7.1 12/06/2016   A/P: need updated a1c- he is going to try to get a1c checked with next bloodwork July 15th with rheumatology - encouraged to double check and make sure his metformin XR isnt recalled.   # Depression S: Taking Bupropion 150 mg BID. Stopped taking Pristiq d/t side effects, "funny feeling".  Depression screen [facility] 2/9 08/26/2018  Decreased Interest 0  Down, Depressed, Hopeless 0  PHQ - 2 Score 0  Altered sleeping 0  Tired, decreased energy 0  Change in appetite 0  Feeling bad or failure about yourself  1  Trouble concentrating 0  Moving slowly or fidgety/restless 0  Suicidal thoughts 0  PHQ-9 Score 1  Difficult doing work/chores Not difficult at all  A/P: Stable. Continue current medications.   #  psoriatic arthritis- stable on remicade monthly with labs every 8 weeks.   #thrombocytosis- long term issue but stable on recent labs. Continue to monitor. Gets labs q8 weeks with rheumatology  # deferring colonoscopy due to covid 19 Future Appointments  Date Time Provider [place]  10/26/2018  1:00 PM Herminio Commons, MD [facility]  11/23/2018 10:00 AM [facility] CO[facility]  02/15/2019  9:20 AM Yong Channel Brayton Mars, MD [facility]   Lab/Order associations:   ICD-10-CM   1. Hypertension associated with diabetes ([place])  E11.59    I10   2. Hyperlipidemia associated with type 2 diabetes mellitus ([place])  E11.69    E78.5   3. Controlled type 2 diabetes mellitus with retinopathy, without long-term current use of insulin, macular edema presence unspecified, unspecified laterality, unspecified retinopathy severity ([place])  E11.319   4. Psoriatic arthritis (HCC) Chronic L40.50   5. Recurrent major depressive disorder, in full remission ([place]) Chronic F33.42 buPROPion (WELLBUTRIN SR) 150 MG 12 hr tablet  6. Thrombocytosis ([place]) Chronic D47.3    Meds ordered this  encounter  Medications  . buPROPion (WELLBUTRIN SR) 150 MG 12 hr tablet    Sig: Take 1 tablet (150 mg total) by mouth 2 (two) times daily.    Dispense:  180 tablet    Refill:  3    Return precautions advised.  Garret Reddish, MD

## 2018-08-26 NOTE — Patient Instructions (Addendum)
Health Maintenance Due  Topic Date Due  . FOOT EXAM  06/25/2018  . COLONOSCOPY - contacted by GI, will schedule after pandemic. [facility] Dr. Havery Moros 07/15/2018  . HEMOGLOBIN A1C  08/11/2018    Depression screen [facility] 2/9 02/09/2018 11/20/2017 06/24/2017  Decreased Interest 2 0 1  Down, Depressed, Hopeless 1 0 0  PHQ - 2 Score 3 0 1  Altered sleeping 0 - 0  Tired, decreased energy 1 - 2  Change in appetite 0 - 0  Feeling bad or failure about yourself  1 - 0  Trouble concentrating 0 - 0  Moving slowly or fidgety/restless 0 - 0  Suicidal thoughts 0 - 0  PHQ-9 Score 5 - 3  Difficult doing work/chores Not difficult at all - Not difficult at all

## 2018-09-23 ENCOUNTER — Telehealth: Payer: Self-pay

## 2018-09-23 DIAGNOSIS — E11319 Type 2 diabetes mellitus with unspecified diabetic retinopathy without macular edema: Secondary | ICD-10-CM

## 2018-09-23 NOTE — Addendum Note (Signed)
Addended by: Jasper Loser on: 09/23/2018 12:48 PM   Modules accepted: Orders

## 2018-09-23 NOTE — Telephone Encounter (Signed)
Copied from [id number]. Topic: General - Inquiry >> Sep 23, 2018  9:31 AM Rainey Pines A wrote: Patient would like a callback from Dr. Yong Channel nurse in regards to provider instructing him to stop by office and get his A1C checked.

## 2018-09-23 NOTE — Telephone Encounter (Signed)
Called and spoke with patient. He came by the office to have A1C checked because his specialist would not check it. He was unable to get A1C checked because he did not have appointment and there was no order in. He was told to just call the office and someone would come out but he guesses that was a miscommunication.   Future lab order placed. Pt will give at least a days notice before coming back by the office to have A1C checked so that he can be placed on the NUR visit schedule.   Pt verbalized understanding.

## 2018-09-30 ENCOUNTER — Other Ambulatory Visit: Payer: Self-pay | Admitting: Family Medicine

## 2018-09-30 DIAGNOSIS — I10 Essential (primary) hypertension: Secondary | ICD-10-CM

## 2018-10-20 ENCOUNTER — Other Ambulatory Visit: Payer: Self-pay | Admitting: Family Medicine

## 2018-10-22 LAB — HEMOGLOBIN A1C: Hemoglobin A1C: 9.2

## 2018-10-23 ENCOUNTER — Telehealth: Payer: Self-pay | Admitting: Cardiovascular Disease

## 2018-10-23 NOTE — Telephone Encounter (Signed)

## 2018-10-26 ENCOUNTER — Other Ambulatory Visit: Payer: Self-pay

## 2018-10-26 ENCOUNTER — Encounter: Payer: Self-pay | Admitting: Cardiovascular Disease

## 2018-10-26 ENCOUNTER — Ambulatory Visit: Payer: Medicare Other | Admitting: Cardiovascular Disease

## 2018-10-26 VITALS — BP 142/64 | Temp 98.7°F | Ht 73.0 in | Wt 184.0 lb

## 2018-10-26 DIAGNOSIS — Z955 Presence of coronary angioplasty implant and graft: Secondary | ICD-10-CM | POA: Diagnosis not present

## 2018-10-26 DIAGNOSIS — I25118 Atherosclerotic heart disease of native coronary artery with other forms of angina pectoris: Secondary | ICD-10-CM

## 2018-10-26 DIAGNOSIS — E785 Hyperlipidemia, unspecified: Secondary | ICD-10-CM

## 2018-10-26 DIAGNOSIS — I340 Nonrheumatic mitral (valve) insufficiency: Secondary | ICD-10-CM

## 2018-10-26 DIAGNOSIS — I10 Essential (primary) hypertension: Secondary | ICD-10-CM | POA: Diagnosis not present

## 2018-10-26 DIAGNOSIS — I341 Nonrheumatic mitral (valve) prolapse: Secondary | ICD-10-CM

## 2018-10-26 MED ORDER — METOPROLOL TARTRATE 25 MG PO TABS: 25.0000 mg | ORAL_TABLET | Freq: Two times a day (BID) | ORAL | 1 refills | Status: DC

## 2018-10-26 NOTE — Patient Instructions (Addendum)
Medication Instructions:    Your physician has recommended you make the following change in your medication:   Start metoprolol tartrate 25 mg by mouth twice daily  Continue all other medications the same  Labwork:  NONE  Testing/Procedures: Your physician has requested that you have an echocardiogram. Echocardiography is a painless test that uses sound waves to create images of your heart. It provides your doctor with information about the size and shape of your heart and how well your heart's chambers and valves are working. This procedure takes approximately one hour. There are no restrictions for this procedure.  Follow-Up:  Your physician recommends that you schedule a follow-up appointment in: 6 months. You will receive a reminder letter in the mail in about 4 months reminding you to call and schedule your appointment. If you don't receive this letter, please contact our office.  Any Other Special Instructions Will Be Listed Below (If Applicable).  If you need a refill on your cardiac medications before your next appointment, please call your pharmacy.

## 2018-10-26 NOTE — Progress Notes (Signed)
SUBJECTIVE: The patient presents for overdue follow-up.  I last saw him in February 2019.  He has a history of coronary artery disease and inferior wall myocardial infarction (2004), hypertension, diabetes mellitus, mitral valve prolapse with mitral regurgitation, and hyperlipidemia.   Echocardiogram 05/21/2017 demonstrated normal LV systolic function, LVEF 60 to 65%, moderate LVH, and mild aortic and mitral gravitation.  There was mild left atrial dilatation.  Nuclear stress test in June 2017 demonstrated old MI with no evidence of ischemia.  The patient denies any symptoms of chest pain, palpitations, shortness of breath, lightheadedness, dizziness, leg swelling, orthopnea, PND, and syncope.  He walks 2 miles daily.  ECG performed today which I ordered and personally interpreted demonstrates sinus tachycardia, 104 bpm, and nonspecific T wave abnormalities.  Review of Systems: As per "subjective", otherwise negative.  Allergies  Allergen Reactions  . Codeine     REACTION: itching and rash  . Lisinopril   . Penicillins     REACTION: as a child    Current Outpatient Medications  Medication Sig Dispense Refill  . ACCU-CHEK AVIVA PLUS test strip USE TO TEST BLOOD SUGAR ONCE DAILY 100 strip 1  . amLODipine (NORVASC) 10 MG tablet TAKE 1 TABLET BY MOUTH EVERY DAY 100 tablet 3  . aspirin EC 81 MG tablet Take 81 mg by mouth daily.    . BD ULTRA-FINE LANCETS lancets Use once daily for glucose control 100 each 3  . buPROPion (WELLBUTRIN SR) 150 MG 12 hr tablet Take 1 tablet (150 mg total) by mouth 2 (two) times daily. 180 tablet 3  . clindamycin (CLEOCIN) 300 MG capsule Take 300 mg by mouth 3 (three) times daily.    . fluconazole (DIFLUCAN) 100 MG tablet Take 100 mg by mouth daily as needed.     . fluocinonide ointment (LIDEX) 0.05 % APPLY TOPICALLY ONCE DAILY AS NEEDED 60 g 3  . glimepiride (AMARYL) 2 MG tablet TAKE 1 TABLET BY MOUTH EVERY DAY BEFORE BREAKFAST 90 tablet 1  .  InFLIXimab (REMICADE IV) Inject into the vein every 8 (eight) weeks.     . metFORMIN (GLUCOPHAGE-XR) 500 MG 24 hr tablet Take 2 tablets (1,000 mg total) by mouth daily with breakfast. 180 tablet 3  . niacin 500 MG tablet Take 500 mg by mouth daily. Take one tab once daily     . nitroGLYCERIN (NITROSTAT) 0.4 MG SL tablet Place 0.4 mg under the tongue every 5 (five) minutes as needed for chest pain. Up to three time and then call the doctor    . simvastatin (ZOCOR) 20 MG tablet TAKE 1 TABLET BY MOUTH AT BEDTIME 100 tablet 1  . sulfamethoxazole-trimethoprim (BACTRIM DS,SEPTRA DS) 800-160 MG tablet Take 1 tablet by mouth every Monday, Wednesday, and Friday.  5  . tadalafil (ADCIRCA/CIALIS) 20 MG tablet Take 1 tablet (20 mg total) by mouth daily as needed for erectile dysfunction (no nitroglycerin within 72 hours). 10 tablet 5  . traMADol (ULTRAM) 50 MG tablet TAKE 1 TABLET BY MOUTH TWICE DAILY AS NEEDED 60 tablet 5  . valACYclovir (VALTREX) 500 MG tablet Take 1 tablet (500 mg total) by mouth as needed. (Patient taking differently: Take 500 mg by mouth 3 (three) times daily. ) 60 tablet 3   No current facility-administered medications for this visit.     Past Medical History:  Diagnosis Date  . Allergic rhinitis   . Anxiety   . CAD (coronary artery disease)   . Collagen vascular disease ([place])   .  Dermatitis    other atopic  . Diabetes mellitus    type II  . Erectile dysfunction   . Fatigue   . Hyperlipidemia   . Hypertension   . Leukopenia   . Myocardial infarction ([place])   . Osteoarthritis     Past Surgical History:  Procedure Laterality Date  . ESOPHAGOGASTRODUODENOSCOPY  04/23/2005  . KNEE ARTHROSCOPY     right  . TONSILLECTOMY      Social History   Socioeconomic History  . Marital status: Married    Spouse name: Not on file  . Number of children: Not on file  . Years of education: Not on file  . Highest education level: Not on file  Occupational History  . Not on file   Social Needs  . Financial resource strain: Not on file  . Food insecurity    Worry: Not on file    Inability: Not on file  . Transportation needs    Medical: Not on file    Non-medical: Not on file  Tobacco Use  . Smoking status: Former Smoker    Packs/day: 2.00    Years: 30.00    Pack years: 60.00    Types: Cigarettes    Quit date: 12/09/2001    Years since quitting: 16.8  . Smokeless tobacco: Former Systems developer    Types: [place] date: 03/11/2002  Substance and Sexual Activity  . Alcohol use: No    Alcohol/week: 0.0 standard drinks  . Drug use: No  . Sexual activity: Not on file  Lifestyle  . Physical activity    Days per week: Not on file    Minutes per session: Not on file  . Stress: Not on file  Relationships  . Social Herbalist on phone: Not on file    Gets together: Not on file    Attends religious service: Not on file    Active member of club or organization: Not on file    Attends meetings of clubs or organizations: Not on file    Relationship status: Not on file  . Intimate partner violence    Fear of current or ex partner: Not on file    Emotionally abused: Not on file    Physically abused: Not on file    Forced sexual activity: Not on file  Other Topics Concern  . Not on file  Social History Narrative   Married. 2 children. 2 stepchildren. 7 grandkids including step   Take care of daughter of law Hector Taft- son left her with 70 year old.    need to go through most history about social      35 years [place] [employer]      Hobbies: golfer, gardening     Vitals:   10/26/18 1300  BP: (!) 142/64  Temp: 98.7 F (37.1 C)  SpO2: 96%  Weight: 184 lb (83.5 kg)  Height: 6\' 1"  (1.854 m)    Wt Readings from Last 3 Encounters:  10/26/18 184 lb (83.5 kg)  08/26/18 185 lb (83.9 kg)  02/09/18 186 lb (84.4 kg)     PHYSICAL EXAM General: NAD HEENT: Normal. Neck: No JVD, no thyromegaly. Lungs: Clear to auscultation bilaterally with normal respiratory  effort. CV: Tachycardic, regular, normal S1/S2, no S3/S4, III/VI apical holosystolic murmur with radiation to axilla. No pretibial or periankle edema.   Abdomen: Soft, nontender, no distention.  Neurologic: Alert and oriented.  Psych: Normal affect. Skin: Normal. Musculoskeletal: No gross deformities.  ECG: Reviewed above under Subjective   Labs: Lab Results  Component Value Date/Time   K 5.8 (A) 06/03/2018   BUN 23 (A) 06/03/2018   CREATININE 1.6 (A) 06/03/2018   CREATININE 1.39 02/09/2018 09:58 AM   ALT 27 06/03/2018   TSH 1.42 02/06/2015 09:51 AM   HGB 16.4 06/03/2018     Lipids: Lab Results  Component Value Date/Time   LDLCALC 57 02/09/2018 09:58 AM   CHOL 119 02/09/2018 09:58 AM   TRIG 140.0 02/09/2018 09:58 AM   HDL 33.40 (L) 02/09/2018 09:58 AM       ASSESSMENT AND PLAN:  1. CAD with h/o inferior wall MI: Symptomatically stable. Continue ASA 81 mg daily and simvastatin. Prefers to avoid a beta blocker.  2.  Bileaflet mitral valve prolapse with mitral regurgitation: He previously had moderate to severe mitral regurgitation but most recent echocardiogram in March 2019 revealed only mild regurgitation.  That being said, physical exam is more consistent with severe mitral regurgitation.  He is also mildly tachycardic.  He is entirely asymptomatic. No symptoms of heart failure.  I will obtain a follow-up echocardiogram.  I explained to him that eventually he will need intervention for his valve.  I will start Lopressor 25 mg twice daily.  3. Essential HTN: Blood pressure is mildly elevated.   I am starting Lopressor 25 mg twice daily.  4. Hyperlipidemia:  Continue simvastatin.  LDL 57 in December 2019.  Lipids reviewed above.   Disposition: Follow up 6 months   Kate Sable, M.D., F.A.C.C.

## 2018-11-02 ENCOUNTER — Telehealth: Payer: Self-pay | Admitting: Cardiovascular Disease

## 2018-11-02 NOTE — Telephone Encounter (Signed)
Informed patient that per Dr. Bronson Ing dictation - the Lopressor is supposed to be 25mg  twice a day.  This particular medication does not come in 10mg .  He verbalized understanding & appreciative of the call.

## 2018-11-02 NOTE — Telephone Encounter (Signed)
metoprolol tartrate (LOPRESSOR) 25 MG tablet  Patient thought he was being put on very low dose around 10 mg

## 2018-11-18 ENCOUNTER — Encounter: Payer: Self-pay | Admitting: Family Medicine

## 2018-11-18 ENCOUNTER — Telehealth: Payer: Self-pay

## 2018-11-18 ENCOUNTER — Other Ambulatory Visit: Payer: Medicare Other

## 2018-11-18 LAB — HEPATIC FUNCTION PANEL
ALT: 24 (ref 10–40)
AST: 22 (ref 14–40)
Alkaline Phosphatase: 57 (ref 25–125)
Bilirubin, Total: 0.4

## 2018-11-18 LAB — CBC AND DIFFERENTIAL
HCT: 50 (ref 41–53)
Hemoglobin: 16.6 (ref 13.5–17.5)
Neutrophils Absolute: 6
Platelets: 820 — AB (ref 150–399)
WBC: 8.4

## 2018-11-18 LAB — BASIC METABOLIC PANEL
BUN: 22 — AB (ref 4–21)
Creatinine: 1.5 — AB (ref 0.6–1.3)
Potassium: 5.7 — AB (ref 3.4–5.3)
Sodium: 138 (ref 137–147)

## 2018-11-18 NOTE — Telephone Encounter (Signed)
Lab report received from [place].   A1C of 9.2%

## 2018-11-19 ENCOUNTER — Other Ambulatory Visit: Payer: Self-pay | Admitting: Family Medicine

## 2018-11-19 ENCOUNTER — Encounter: Payer: Self-pay | Admitting: Family Medicine

## 2018-11-19 DIAGNOSIS — E7800 Pure hypercholesterolemia, unspecified: Secondary | ICD-10-CM

## 2018-11-19 NOTE — Telephone Encounter (Signed)
Called pt and advised. He is not currently seeing an endocrinologist. He is scheduled for wellness visit with [facility] on 11/23/18. Per pt, OK to relay information/recommendations to [place] and he will discuss with her at his visit on 11/23/18.

## 2018-11-19 NOTE — Telephone Encounter (Signed)
Please verify he is taking metformin and that his lot has not been recalled that he was supposed to call to check.  Increase glimepiride to 4 mg #30 daily with 5 refills.  Start Januvia 50 mg daily #30 with 5 refills.  We will recheck at December visit.  He also needs to buckle down on healthy eating and regular exercise-can refer to diabetes nutrition if he would like

## 2018-11-20 MED ORDER — SITAGLIPTIN PHOSPHATE 50 MG PO TABS: 50.0000 mg | ORAL_TABLET | Freq: Every day | ORAL | 5 refills | Status: DC

## 2018-11-20 MED ORDER — GLIMEPIRIDE 4 MG PO TABS: 4.0000 mg | ORAL_TABLET | Freq: Every day | ORAL | 5 refills | Status: DC

## 2018-11-20 NOTE — Telephone Encounter (Signed)
Rx sent to pharmacy. Spoke to pt to give update. Pt verbalized understanding and stated that he has an appt. With [facility] Monday and would like to speak to Dr. Yong Channel about the [facility] when he comes in. Pt aware Rx sent to pharmacy.

## 2018-11-20 NOTE — Addendum Note (Signed)
Addended by: Gwenyth Ober R on: 11/20/2018 09:17 AM   Modules accepted: Orders

## 2018-11-23 ENCOUNTER — Ambulatory Visit (INDEPENDENT_AMBULATORY_CARE_PROVIDER_SITE_OTHER): Payer: Medicare Other

## 2018-11-23 ENCOUNTER — Other Ambulatory Visit: Payer: Self-pay

## 2018-11-23 VITALS — BP 110/66 | Temp 97.8°F | Ht 73.0 in | Wt 183.2 lb

## 2018-11-23 DIAGNOSIS — Z0000 Encounter for general adult medical examination without abnormal findings: Secondary | ICD-10-CM | POA: Diagnosis not present

## 2018-11-23 DIAGNOSIS — Z23 Encounter for immunization: Secondary | ICD-10-CM | POA: Diagnosis not present

## 2018-11-23 NOTE — Progress Notes (Signed)
Subjective:   Chase Anderson is a 73 y.o. male who presents for Medicare Annual/Subsequent preventive examination.  Review of Systems:   Cardiac Risk Factors include: advanced age (>75men, >5 women);diabetes mellitus;male gender;hypertension     Objective:    Vitals: BP 110/66 (BP Location: Left Arm, Patient Position: Sitting, Cuff Size: Normal)   Temp 97.8 F (36.6 C) (Temporal)   Ht 6\' 1"  (1.854 m)   Wt 183 lb 3.2 oz (83.1 kg)   BMI 24.17 kg/m   Body mass index is 24.17 kg/m.  Advanced Directives 11/23/2018 11/20/2017 10/30/2016 08/03/2015  Does Patient Have a Medical Advance Directive? No No No No  Would patient like information on creating a medical advance directive? Yes (MAU/Ambulatory/Procedural Areas - Information given) - - No - patient declined information    Tobacco Social History   Tobacco Use  Smoking Status Former Smoker  . Packs/day: 2.00  . Years: 30.00  . Pack years: 60.00  . Types: Cigarettes  . Quit date: 12/09/2001  . Years since quitting: 16.9  Smokeless Tobacco Former Systems developer  . Types: Chew  . Quit date: 03/11/2002     Counseling given: Not Answered   Clinical Intake:  Pre-visit preparation completed: Yes  Pain : No/denies pain  Diabetes: Yes CBG done?: No Did pt. bring in CBG monitor from home?: No(reports as 138 fasting this am)  How often do you need to have someone help you when you read instructions, pamphlets, or other written materials from your doctor or pharmacy?: 1 - Never  Interpreter Needed?: No  Information entered by :: Denman George LPN  Past Medical History:  Diagnosis Date  . Allergic rhinitis   . Anxiety   . CAD (coronary artery disease)   . Collagen vascular disease ([place])   . Dermatitis    other atopic  . Diabetes mellitus    type II  . Erectile dysfunction   . Fatigue   . Hyperlipidemia   . Hypertension   . Leukopenia   . Myocardial infarction ([place])   . Osteoarthritis    Past Surgical History:   Procedure Laterality Date  . ESOPHAGOGASTRODUODENOSCOPY  04/23/2005  . KNEE ARTHROSCOPY     right  . TONSILLECTOMY     History reviewed. No pertinent family history. Social History   Socioeconomic History  . Marital status: Married    Spouse name: Not on file  . Number of children: Not on file  . Years of education: Not on file  . Highest education level: Not on file  Occupational History  . Not on file  Social Needs  . Financial resource strain: Not on file  . Food insecurity    Worry: Not on file    Inability: Not on file  . Transportation needs    Medical: Not on file    Non-medical: Not on file  Tobacco Use  . Smoking status: Former Smoker    Packs/day: 2.00    Years: 30.00    Pack years: 60.00    Types: Cigarettes    Quit date: 12/09/2001    Years since quitting: 16.9  . Smokeless tobacco: Former Systems developer    Types: [place] date: 03/11/2002  Substance and Sexual Activity  . Alcohol use: No    Alcohol/week: 0.0 standard drinks  . Drug use: No  . Sexual activity: Not on file  Lifestyle  . Physical activity    Days per week: Not on file    Minutes per session:  Not on file  . Stress: Not on file  Relationships  . Social Herbalist on phone: Not on file    Gets together: Not on file    Attends religious service: Not on file    Active member of club or organization: Not on file    Attends meetings of clubs or organizations: Not on file    Relationship status: Not on file  Other Topics Concern  . Not on file  Social History Narrative   Married. 2 children. 2 stepchildren. 7 grandkids including step   Take care of daughter of law Adedeji Strosnider- son left her with 13 year old.    need to go through most history about social      35 years [place] [employer]      Hobbies: golfer, gardening    Outpatient Encounter Medications as of 11/23/2018  Medication Sig  . ACCU-CHEK AVIVA PLUS test strip USE TO TEST BLOOD SUGAR ONCE DAILY  . amLODipine (NORVASC) 10 MG tablet  TAKE 1 TABLET BY MOUTH EVERY DAY  . aspirin EC 81 MG tablet Take 81 mg by mouth daily.  . BD ULTRA-FINE LANCETS lancets Use once daily for glucose control  . buPROPion (WELLBUTRIN SR) 150 MG 12 hr tablet Take 1 tablet (150 mg total) by mouth 2 (two) times daily.  . clindamycin (CLEOCIN) 300 MG capsule Take 300 mg by mouth 3 (three) times daily.  . fluconazole (DIFLUCAN) 100 MG tablet Take 100 mg by mouth daily as needed.   . fluocinonide ointment (LIDEX) 0.05 % APPLY TOPICALLY ONCE DAILY AS NEEDED  . glimepiride (AMARYL) 4 MG tablet Take 1 tablet (4 mg total) by mouth daily before breakfast.  . InFLIXimab (REMICADE IV) Inject into the vein every 8 (eight) weeks.   . metFORMIN (GLUCOPHAGE-XR) 500 MG 24 hr tablet Take 2 tablets (1,000 mg total) by mouth daily with breakfast.  . metoprolol tartrate (LOPRESSOR) 25 MG tablet Take 1 tablet (25 mg total) by mouth 2 (two) times daily.  . niacin 500 MG tablet Take 500 mg by mouth daily. Take one tab once daily   . nitroGLYCERIN (NITROSTAT) 0.4 MG SL tablet Place 0.4 mg under the tongue every 5 (five) minutes as needed for chest pain. Up to three time and then call the doctor  . simvastatin (ZOCOR) 20 MG tablet TAKE 1 TABLET BY MOUTH AT BEDTIME  . sitaGLIPtin (JANUVIA) 50 MG tablet Take 1 tablet (50 mg total) by mouth daily.  Marland Kitchen sulfamethoxazole-trimethoprim (BACTRIM DS,SEPTRA DS) 800-160 MG tablet Take 1 tablet by mouth every Monday, Wednesday, and Friday.  . tadalafil (ADCIRCA/CIALIS) 20 MG tablet Take 1 tablet (20 mg total) by mouth daily as needed for erectile dysfunction (no nitroglycerin within 72 hours).  . traMADol (ULTRAM) 50 MG tablet TAKE 1 TABLET BY MOUTH TWICE DAILY AS NEEDED  . valACYclovir (VALTREX) 500 MG tablet Take 1 tablet (500 mg total) by mouth as needed. (Patient taking differently: Take 500 mg by mouth 3 (three) times daily. )   No facility-administered encounter medications on file as of 11/23/2018.     Activities of Daily Living  In your present state of health, do you have any difficulty performing the following activities: 11/23/2018  Hearing? N  Vision? N  Difficulty concentrating or making decisions? N  Walking or climbing stairs? N  Dressing or bathing? N  Doing errands, shopping? N  Preparing Food and eating ? N  Using the Toilet? N  In the past six  months, have you accidently leaked urine? N  Do you have problems with loss of bowel control? N  Managing your Medications? N  Managing your Finances? N  Housekeeping or managing your Housekeeping? N  Some recent data might be hidden    Patient Care Team: Marin Olp, MD as PCP - General (Family Medicine) Herminio Commons, MD as PCP - Cardiology (Cardiology) Unice Bailey, MD as Consulting Physician (Rheumatology) Bayard Hugger Danie Binder, MD as Consulting Physician (Infectious Diseases) Hennie Duos, MD as Consulting Physician (Rheumatology) Madelin Headings, DO as Consulting Physician (Optometry)   Assessment:   This is a routine wellness examination for Chase Anderson.  Exercise Activities and Dietary recommendations Current Exercise Habits: The patient does not participate in regular exercise at present  Goals    . Exercise 150 minutes per week (moderate activity)     Will continue to exercise x 4 days a week   Patient recently stopped walking after death of dog- plans to restart        Fall Risk Fall Risk  11/23/2018 08/26/2018 11/20/2017 10/30/2016 09/27/2016  Falls in the past year? 0 0 No Yes Yes  Number falls in past yr: 0 - - 1 1  Injury with Fall? 0 - - - No  Follow up Education provided;Falls evaluation completed - - Education provided -   Is the patient's home free of loose throw rugs in walkways, pet beds, electrical cords, etc?   yes      Grab bars in the bathroom? yes      Handrails on the stairs?   yes      Adequate lighting?   yes  Timed Get Up and Go Performed: completed and within normal timeframe; no gait abnormalities  noted    Depression Screen PHQ 2/9 Scores 11/23/2018 08/26/2018 02/09/2018 11/20/2017  PHQ - 2 Score 0 0 3 0  PHQ- 9 Score - 1 5 -    Cognitive Function- no cognitive concerns at this time  MMSE - Mini Mental State Exam 11/20/2017 10/30/2016  Not completed: (No Data) (No Data)     6CIT Screen 11/23/2018  What Year? 0 points  What month? 0 points  What time? 0 points  Count back from 20 0 points  Months in reverse 0 points  Repeat phrase 0 points  Total Score 0    Immunization History  Administered Date(s) Administered  . Fluad Quad(high Dose 65+) 11/23/2018  . Influenza Split 01/23/2012, 11/19/2012  . Influenza Whole 11/26/2007, 12/14/2009  . Influenza, High Dose Seasonal PF 02/01/2014, 02/06/2015, 02/07/2016, 12/06/2016  . Influenza, Seasonal, Injecte, Preservative Fre 01/05/2009  . Influenza,inj,Quad PF,6+ Mos 11/19/2012  . Influenza-Unspecified 01/05/2009, 11/27/2017  . Pneumococcal Conjugate-13 06/02/2013  . Pneumococcal Polysaccharide-23 03/11/2004, 06/29/2009  . Td 03/11/2002  . Tetanus 03/10/2013  . Zoster Recombinat (Shingrix) 06/10/2017, 10/01/2017    Qualifies for Shingles Vaccine? Completed   Screening Tests Health Maintenance  Topic Date Due  . FOOT EXAM  06/25/2018  . COLONOSCOPY  07/15/2018  . URINE MICROALBUMIN  02/10/2019  . OPHTHALMOLOGY EXAM  04/08/2019  . HEMOGLOBIN A1C  04/24/2019  . TETANUS/TDAP  03/11/2023  . INFLUENZA VACCINE  Completed  . Hepatitis C Screening  Completed  . PNA vac Low Risk Adult  Addressed   Cancer Screenings: Lung: Low Dose CT Chest recommended if Age 84-80 years, 30 pack-year currently smoking OR have quit w/in 15years. Patient does not qualify. Colorectal: patient is due for colonoscopy but wishes to hold on this for this year  Plan:    I have personally reviewed and addressed the Medicare Annual Wellness questionnaire and have noted the following in the patient's chart:  A. Medical and social history B. Use of  alcohol, tobacco or illicit drugs  C. Current medications and supplements D. Functional ability and status E.  Nutritional status F.  Physical activity G. Advance directives H. List of other physicians I.  Hospitalizations, surgeries, and ER visits in previous 12 months J.  [place] such as hearing and vision if needed, cognitive and depression L. Referrals, records requested, and appointments- will request last labs from Dr. Amil Amen   In addition, I have reviewed and discussed with patient certain preventive protocols, quality metrics, and best practice recommendations. A written personalized care plan for preventive services as well as general preventive health recommendations were provided to patient.   Signed,  Denman George, LPN  Nurse Health Advisor   Nurse Notes: I will call patient in 2 weeks to follow up on blood sugars with change in medications.

## 2018-11-23 NOTE — Patient Instructions (Addendum)
[name] , Thank you for taking time to come for your Medicare Wellness Visit. I appreciate your ongoing commitment to your health goals. Please review the following plan we discussed and let me know if I can assist you in the future.   Screening recommendations/referrals: Colorectal Screening: recommended   Vision and Dental Exams: Recommended annual ophthalmology exams for early detection of glaucoma and other disorders of the eye Recommended annual dental exams for proper oral hygiene  Diabetic Exams: Diabetic Eye Exam: up to date  Diabetic Foot Exam: at next visit   Vaccinations: Influenza vaccine: today  Pneumococcal vaccine: up to date  Tdap vaccine:Please call your insurance company to determine your out of pocket expense. You may also receive this vaccine at your local pharmacy or Health Dept. Shingles vaccine: completed   Advanced directives: Advance directives discussed with you today. I have provided a copy for you to complete at home and have notarized. Once this is complete please bring a copy in to our office so we can scan it into your chart.  Goals: Recommend to drink at least 6-8 8oz glasses of water per day and recommend to decrease portion sizes by eating 3 small healthy meals and at least 2 healthy snacks per day.  Next appointment: Please schedule your Annual Wellness Visit with your Nurse Health Advisor in one year.  I will follow up with you in a couple weeks to see how your blood sugars are doing.  Dr. Yong Channel does want you to take all 3 medications for your blood sugar.   Preventive Care 19 Years and Older, Male Preventive care refers to lifestyle choices and visits with your health care provider that can promote health and wellness. What does preventive care include?  A yearly physical exam. This is also called an annual well check.  Dental exams once or twice a year.  Routine eye exams. Ask your health care provider how often you should have your eyes  checked.  Personal lifestyle choices, including:  Daily care of your teeth and gums.  Regular physical activity.  Eating a healthy diet.  Avoiding tobacco and drug use.  Limiting alcohol use.  Practicing safe sex.  Taking low doses of aspirin every day if recommended by your health care provider..  Taking vitamin and mineral supplements as recommended by your health care provider. What happens during an annual well check? The services and screenings done by your health care provider during your annual well check will depend on your age, overall health, lifestyle risk factors, and family history of disease. Counseling  Your health care provider may ask you questions about your:  Alcohol use.  Tobacco use.  Drug use.  Emotional well-being.  Home and relationship well-being.  Sexual activity.  Eating habits.  History of falls.  Memory and ability to understand (cognition).  Work and work Statistician. Screening  You may have the following tests or measurements:  Height, weight, and BMI.  Blood pressure.  Lipid and cholesterol levels. These may be checked every 5 years, or more frequently if you are over 83 years old.  Skin check.  Lung cancer screening. You may have this screening every year starting at age 44 if you have a 30-pack-year history of smoking and currently smoke or have quit within the past 15 years.  Fecal occult blood test (FOBT) of the stool. You may have this test every year starting at age 34.  Flexible sigmoidoscopy or colonoscopy. You may have a sigmoidoscopy every 5 years  or a colonoscopy every 10 years starting at age 16.  Prostate cancer screening. Recommendations will vary depending on your family history and other risks.  Hepatitis C blood test.  Hepatitis B blood test.  Sexually transmitted disease (STD) testing.  Diabetes screening. This is done by checking your blood sugar (glucose) after you have not eaten for a while  (fasting). You may have this done every 1-3 years.  Abdominal aortic aneurysm (AAA) screening. You may need this if you are a current or former smoker.  Osteoporosis. You may be screened starting at age 23 if you are at high risk. Talk with your health care provider about your test results, treatment options, and if necessary, the need for more tests. Vaccines  Your health care provider may recommend certain vaccines, such as:  Influenza vaccine. This is recommended every year.  Tetanus, diphtheria, and acellular pertussis (Tdap, Td) vaccine. You may need a Td booster every 10 years.  Zoster vaccine. You may need this after age 28.  Pneumococcal 13-valent conjugate (PCV13) vaccine. One dose is recommended after age 29.  Pneumococcal polysaccharide (PPSV23) vaccine. One dose is recommended after age 15. Talk to your health care provider about which screenings and vaccines you need and how often you need them. This information is not intended to replace advice given to you by your health care provider. Make sure you discuss any questions you have with your health care provider. Document Released: 03/24/2015 Document Revised: 11/15/2015 Document Reviewed: 12/27/2014 Elsevier Interactive Patient Education  2017 [place] Prevention in the Home Falls can cause injuries. They can happen to people of all ages. There are many things you can do to make your home safe and to help prevent falls. What can I do on the outside of my home?  Regularly fix the edges of walkways and driveways and fix any cracks.  Remove anything that might make you trip as you walk through a door, such as a raised step or threshold.  Trim any bushes or trees on the path to your home.  Use bright outdoor lighting.  Clear any walking paths of anything that might make someone trip, such as rocks or tools.  Regularly check to see if handrails are loose or broken. Make sure that both sides of any steps have  handrails.  Any raised decks and porches should have guardrails on the edges.  Have any leaves, snow, or ice cleared regularly.  Use sand or salt on walking paths during winter.  Clean up any spills in your garage right away. This includes oil or grease spills. What can I do in the bathroom?  Use night lights.  Install grab bars by the toilet and in the tub and shower. Do not use towel bars as grab bars.  Use non-skid mats or decals in the tub or shower.  If you need to sit down in the shower, use a plastic, non-slip stool.  Keep the floor dry. Clean up any water that spills on the floor as soon as it happens.  Remove soap buildup in the tub or shower regularly.  Attach bath mats securely with double-sided non-slip rug tape.  Do not have throw rugs and other things on the floor that can make you trip. What can I do in the bedroom?  Use night lights.  Make sure that you have a light by your bed that is easy to reach.  Do not use any sheets or blankets that are too big for your  bed. They should not hang down onto the floor.  Have a firm chair that has side arms. You can use this for support while you get dressed.  Do not have throw rugs and other things on the floor that can make you trip. What can I do in the kitchen?  Clean up any spills right away.  Avoid walking on wet floors.  Keep items that you use a lot in easy-to-reach places.  If you need to reach something above you, use a strong step stool that has a grab bar.  Keep electrical cords out of the way.  Do not use floor polish or wax that makes floors slippery. If you must use wax, use non-skid floor wax.  Do not have throw rugs and other things on the floor that can make you trip. What can I do with my stairs?  Do not leave any items on the stairs.  Make sure that there are handrails on both sides of the stairs and use them. Fix handrails that are broken or loose. Make sure that handrails are as long as  the stairways.  Check any carpeting to make sure that it is firmly attached to the stairs. Fix any carpet that is loose or worn.  Avoid having throw rugs at the top or bottom of the stairs. If you do have throw rugs, attach them to the floor with carpet tape.  Make sure that you have a light switch at the top of the stairs and the bottom of the stairs. If you do not have them, ask someone to add them for you. What else can I do to help prevent falls?  Wear shoes that:  Do not have high heels.  Have rubber bottoms.  Are comfortable and fit you well.  Are closed at the toe. Do not wear sandals.  If you use a stepladder:  Make sure that it is fully opened. Do not climb a closed stepladder.  Make sure that both sides of the stepladder are locked into place.  Ask someone to hold it for you, if possible.  Clearly mark and make sure that you can see:  Any grab bars or handrails.  First and last steps.  Where the edge of each step is.  Use tools that help you move around (mobility aids) if they are needed. These include:  Canes.  Walkers.  Scooters.  Crutches.  Turn on the lights when you go into a dark area. Replace any light bulbs as soon as they burn out.  Set up your furniture so you have a clear path. Avoid moving your furniture around.  If any of your floors are uneven, fix them.  If there are any pets around you, be aware of where they are.  Review your medicines with your doctor. Some medicines can make you feel dizzy. This can increase your chance of falling. Ask your doctor what other things that you can do to help prevent falls. This information is not intended to replace advice given to you by your health care provider. Make sure you discuss any questions you have with your health care provider. Document Released: 12/22/2008 Document Revised: 08/03/2015 Document Reviewed: 04/01/2014 Elsevier Interactive Patient Education  2017 [employer].

## 2018-11-23 NOTE — Progress Notes (Signed)
I have reviewed and agree with note, evaluation, plan.   Titianna Loomis, MD  

## 2018-12-04 ENCOUNTER — Encounter: Payer: Self-pay | Admitting: Family Medicine

## 2018-12-09 ENCOUNTER — Other Ambulatory Visit: Payer: Self-pay

## 2018-12-09 ENCOUNTER — Ambulatory Visit (INDEPENDENT_AMBULATORY_CARE_PROVIDER_SITE_OTHER): Payer: Medicare Other

## 2018-12-09 DIAGNOSIS — I340 Nonrheumatic mitral (valve) insufficiency: Secondary | ICD-10-CM

## 2018-12-10 ENCOUNTER — Telehealth: Payer: Self-pay | Admitting: *Deleted

## 2018-12-10 NOTE — Telephone Encounter (Signed)
Notes recorded by Laurine Blazer, LPN on 624THL at 624THL PM EDT  Patient notified, copy to pmd.  ------   Notes recorded by Herminio Commons, MD on 12/09/2018 at 7:12 PM EDT  Normal pumping function. Moderate to severe mitral valve leakage. No change to management strategy.

## 2019-02-11 ENCOUNTER — Other Ambulatory Visit: Payer: Self-pay | Admitting: Family Medicine

## 2019-02-12 ENCOUNTER — Other Ambulatory Visit: Payer: Self-pay

## 2019-02-15 ENCOUNTER — Other Ambulatory Visit: Payer: Self-pay

## 2019-02-15 ENCOUNTER — Encounter: Payer: Self-pay | Admitting: Family Medicine

## 2019-02-15 ENCOUNTER — Ambulatory Visit (INDEPENDENT_AMBULATORY_CARE_PROVIDER_SITE_OTHER): Payer: Medicare Other | Admitting: Family Medicine

## 2019-02-15 VITALS — BP 136/78 | HR 74 | Temp 97.2°F | Ht 73.0 in | Wt 187.2 lb

## 2019-02-15 DIAGNOSIS — E1169 Type 2 diabetes mellitus with other specified complication: Secondary | ICD-10-CM | POA: Diagnosis not present

## 2019-02-15 DIAGNOSIS — N183 Chronic kidney disease, stage 3 unspecified: Secondary | ICD-10-CM

## 2019-02-15 DIAGNOSIS — E1159 Type 2 diabetes mellitus with other circulatory complications: Secondary | ICD-10-CM

## 2019-02-15 DIAGNOSIS — I152 Hypertension secondary to endocrine disorders: Secondary | ICD-10-CM

## 2019-02-15 DIAGNOSIS — I340 Nonrheumatic mitral (valve) insufficiency: Secondary | ICD-10-CM

## 2019-02-15 DIAGNOSIS — D75839 Thrombocytosis, unspecified: Secondary | ICD-10-CM

## 2019-02-15 DIAGNOSIS — F3342 Major depressive disorder, recurrent, in full remission: Secondary | ICD-10-CM | POA: Diagnosis not present

## 2019-02-15 DIAGNOSIS — D473 Essential (hemorrhagic) thrombocythemia: Secondary | ICD-10-CM

## 2019-02-15 DIAGNOSIS — I10 Essential (primary) hypertension: Secondary | ICD-10-CM

## 2019-02-15 DIAGNOSIS — E113293 Type 2 diabetes mellitus with mild nonproliferative diabetic retinopathy without macular edema, bilateral: Secondary | ICD-10-CM

## 2019-02-15 DIAGNOSIS — Z1211 Encounter for screening for malignant neoplasm of colon: Secondary | ICD-10-CM

## 2019-02-15 DIAGNOSIS — Z0000 Encounter for general adult medical examination without abnormal findings: Secondary | ICD-10-CM

## 2019-02-15 DIAGNOSIS — I2510 Atherosclerotic heart disease of native coronary artery without angina pectoris: Secondary | ICD-10-CM | POA: Diagnosis not present

## 2019-02-15 DIAGNOSIS — L301 Dyshidrosis [pompholyx]: Secondary | ICD-10-CM

## 2019-02-15 DIAGNOSIS — D72819 Decreased white blood cell count, unspecified: Secondary | ICD-10-CM

## 2019-02-15 DIAGNOSIS — E785 Hyperlipidemia, unspecified: Secondary | ICD-10-CM

## 2019-02-15 DIAGNOSIS — L4050 Arthropathic psoriasis, unspecified: Secondary | ICD-10-CM

## 2019-02-15 DIAGNOSIS — E11319 Type 2 diabetes mellitus with unspecified diabetic retinopathy without macular edema: Secondary | ICD-10-CM

## 2019-02-15 LAB — CBC WITH DIFFERENTIAL/PLATELET
Basophils Absolute: 0.1 10*3/uL (ref 0.0–0.1)
Basophils Relative: 1 % (ref 0.0–3.0)
Eosinophils Absolute: 0.3 10*3/uL (ref 0.0–0.7)
Eosinophils Relative: 4 % (ref 0.0–5.0)
HCT: 49.1 % (ref 39.0–52.0)
Hemoglobin: 16.5 g/dL (ref 13.0–17.0)
Lymphocytes Relative: 10.3 % — ABNORMAL LOW (ref 12.0–46.0)
Lymphs Abs: 0.9 10*3/uL (ref 0.7–4.0)
MCHC: 33.7 g/dL (ref 30.0–36.0)
MCV: 89.6 fl (ref 78.0–100.0)
Monocytes Absolute: 0.6 10*3/uL (ref 0.1–1.0)
Monocytes Relative: 7.5 % (ref 3.0–12.0)
Neutro Abs: 6.5 10*3/uL (ref 1.4–7.7)
Neutrophils Relative %: 77.2 % — ABNORMAL HIGH (ref 43.0–77.0)
Platelets: 842 10*3/uL — ABNORMAL HIGH (ref 150.0–400.0)
RBC: 5.47 Mil/uL (ref 4.22–5.81)
RDW: 15 % (ref 11.5–15.5)
WBC: 8.4 10*3/uL (ref 4.0–10.5)

## 2019-02-15 LAB — LIPID PANEL
Cholesterol: 100 mg/dL (ref 0–200)
HDL: 31.1 mg/dL — ABNORMAL LOW (ref >39.00)
LDL Cholesterol: 42 mg/dL (ref 0–99)
NonHDL: 69.31
Total CHOL/HDL Ratio: 3
Triglycerides: 135 mg/dL (ref 0.0–149.0)
VLDL: 27 mg/dL (ref 0.0–40.0)

## 2019-02-15 LAB — MICROALBUMIN / CREATININE URINE RATIO
Creatinine,U: 100.2 mg/dL
Microalb Creat Ratio: 3.3 mg/g (ref 0.0–30.0)
Microalb, Ur: 3.3 mg/dL — ABNORMAL HIGH (ref 0.0–1.9)

## 2019-02-15 LAB — COMPREHENSIVE METABOLIC PANEL
ALT: 24 U/L (ref 0–53)
AST: 22 U/L (ref 0–37)
Albumin: 4.5 g/dL (ref 3.5–5.2)
Alkaline Phosphatase: 44 U/L (ref 39–117)
BUN: 19 mg/dL (ref 6–23)
CO2: 26 mEq/L (ref 19–32)
Calcium: 9.7 mg/dL (ref 8.4–10.5)
Chloride: 102 mEq/L (ref 96–112)
Creatinine, Ser: 1.56 mg/dL — ABNORMAL HIGH (ref 0.40–1.50)
GFR: 43.73 mL/min — ABNORMAL LOW (ref >60.00)
Glucose, Bld: 148 mg/dL — ABNORMAL HIGH (ref 70–99)
Potassium: 4.7 mEq/L (ref 3.5–5.1)
Sodium: 135 mEq/L (ref 135–145)
Total Bilirubin: 0.6 mg/dL (ref 0.2–1.2)
Total Protein: 6.9 g/dL (ref 6.0–8.3)

## 2019-02-15 LAB — POC URINALSYSI DIPSTICK (AUTOMATED)
Bilirubin, UA: NEGATIVE
Blood, UA: NEGATIVE
Glucose, UA: NEGATIVE
Ketones, UA: NEGATIVE
Leukocytes, UA: NEGATIVE
Nitrite, UA: NEGATIVE
Protein, UA: NEGATIVE
Spec Grav, UA: 1.025 (ref 1.010–1.025)
Urobilinogen, UA: 0.2 E.U./dL
pH, UA: 6 (ref 5.0–8.0)

## 2019-02-15 LAB — HEMOGLOBIN A1C: Hgb A1c MFr Bld: 6.9 % — ABNORMAL HIGH (ref 4.6–6.5)

## 2019-02-15 MED ORDER — TADALAFIL 20 MG PO TABS: 20.0000 mg | ORAL_TABLET | Freq: Every day | ORAL | 5 refills | Status: AC | PRN

## 2019-02-15 NOTE — Patient Instructions (Addendum)
Health Maintenance Due  Topic Date Due  . URINE MICROALBUMIN - today with labs 02/10/2019   Please stop by lab before you go If you do not have mychart- we will call you about results within 5 business days of [place] receiving them.  If you have mychart- we will send your results within 3 business days of [place] receiving them.  If abnormal or we want to clarify a result, we will call or mychart you to make sure you receive the message.  If you have questions or concerns or don't hear within 5-7 days, please send [place] a message or call us.   Recommended follow up: Return in about 6 months (around 08/16/2019) for DM follow up, f/u HTN.

## 2019-02-15 NOTE — Progress Notes (Signed)
Phone: [phone]   Subjective:  Patient presents today for their annual physical. Chief complaint-noted.   See problem oriented charting- Review of Systems  Constitutional: Negative.   HENT: Negative.   Eyes: Negative.   Respiratory: Negative.   Cardiovascular: Negative.   Gastrointestinal: Negative.   Genitourinary: Negative.   Musculoskeletal: Negative.   Skin: Negative.   Neurological: Negative.   Endo/Heme/Allergies: Negative.   Psychiatric/Behavioral: Positive for depression. Negative for suicidal ideas. The patient does not have insomnia.        On medication   also with some fatigue  The following were reviewed and entered/updated in epic: Past Medical History:  Diagnosis Date  . Allergic rhinitis   . Anxiety   . CAD (coronary artery disease)   . Collagen vascular disease ([place])   . Dermatitis    other atopic  . Diabetes mellitus    type II  . Erectile dysfunction   . Fatigue   . Hyperlipidemia   . Hypertension   . Leukopenia   . Myocardial infarction ([place])   . Osteoarthritis    Patient Active Problem List   Diagnosis Date Noted  . Thrombocytosis ([place]) 04/03/2016    Priority: High  . Depression 03/27/2015    Priority: High  . Psoriatic arthritis ([place]) 01/25/2013    Priority: High  . Mitral valve prolapse 12/25/2010    Priority: High  . Leukocytopenia 11/24/2006    Priority: High  . Controlled type 2 diabetes mellitus with ophthalmic complication ([place]) 0000000    Priority: High  . CAD (coronary artery disease) with history MI 2004- no intervention 10/06/2006    Priority: High  . Dyshidrotic eczema 02/07/2016    Priority: Medium  . CKD (chronic kidney disease), stage III ([place]) 09/15/2015    Priority: Medium  . History of BPH 12/14/2009    Priority: Medium  . Hyperlipidemia associated with type 2 diabetes mellitus ([place]) 10/06/2006    Priority: Medium  . Hypertension associated with diabetes ([place]) 10/06/2006    Priority: Medium  . Diabetic  retinopathy ([place]) 02/20/2017    Priority: Low  . ERECTILE DYSFUNCTION 11/26/2007    Priority: Low  . Recurrent major depressive disorder, in full remission ([place]) 08/26/2018   Past Surgical History:  Procedure Laterality Date  . ESOPHAGOGASTRODUODENOSCOPY  04/23/2005  . KNEE ARTHROSCOPY     right  . TONSILLECTOMY      History reviewed. No pertinent family history.  Medications- reviewed and updated Current Outpatient Medications  Medication Sig Dispense Refill  . amLODipine (NORVASC) 10 MG tablet TAKE 1 TABLET BY MOUTH EVERY DAY 100 tablet 3  . aspirin EC 81 MG tablet Take 81 mg by mouth daily.    Marland Kitchen buPROPion (WELLBUTRIN SR) 150 MG 12 hr tablet Take 1 tablet (150 mg total) by mouth 2 (two) times daily. 180 tablet 3  . clindamycin (CLEOCIN) 300 MG capsule Take 300 mg by mouth 3 (three) times daily.    . fluconazole (DIFLUCAN) 100 MG tablet Take 100 mg by mouth daily as needed.     . fluocinonide ointment (LIDEX) 0.05 % APPLY TOPICALLY ONCE DAILY AS NEEDED 60 g 3  . glimepiride (AMARYL) 4 MG tablet Take 1 tablet (4 mg total) by mouth daily before breakfast. 30 tablet 5  . InFLIXimab (REMICADE IV) Inject into the vein every 8 (eight) weeks.     . metFORMIN (GLUCOPHAGE-XR) 500 MG 24 hr tablet TAKE 2 TABLETS BY MOUTH DAILY WITH BREAKFAST 180 tablet 3  . metoprolol tartrate (LOPRESSOR) 25  MG tablet Take 25 mg by mouth 2 (two) times daily.    . nitroGLYCERIN (NITROSTAT) 0.4 MG SL tablet Place 0.4 mg under the tongue every 5 (five) minutes as needed for chest pain. Up to three time and then call the doctor    . simvastatin (ZOCOR) 20 MG tablet TAKE 1 TABLET BY MOUTH AT BEDTIME 100 tablet 1  . sitaGLIPtin (JANUVIA) 50 MG tablet Take 1 tablet (50 mg total) by mouth daily. 30 tablet 5  . sulfamethoxazole-trimethoprim (BACTRIM DS,SEPTRA DS) 800-160 MG tablet Take 1 tablet by mouth every Monday, Wednesday, and Friday.  5  . tadalafil (ADCIRCA/CIALIS) 20 MG tablet Take 1 tablet (20 mg total) by  mouth daily as needed for erectile dysfunction (no nitroglycerin within 72 hours). 10 tablet 5  . traMADol (ULTRAM) 50 MG tablet TAKE 1 TABLET BY MOUTH TWICE DAILY AS NEEDED 60 tablet 5  . valACYclovir (VALTREX) 500 MG tablet Take 1 tablet (500 mg total) by mouth as needed. (Patient taking differently: Take 500 mg by mouth 3 (three) times daily. ) 60 tablet 3   No current facility-administered medications for this visit.     Allergies-reviewed and updated Allergies  Allergen Reactions  . Codeine     REACTION: itching and rash  . Lisinopril   . Penicillins     REACTION: as a child    Social History   Social History Narrative   Married. 2 children. 2 stepchildren. 7 grandkids including step   Take care of daughter of law Keyvin Eavey- son left her with 3 year old.    need to go through most history about social      35 years [place] [employer]      Hobbies: golfer, gardening   Objective  Objective:  BP 136/78   Pulse 74   Temp (!) 97.2 F (36.2 C) (Temporal)   Ht 6\' 1"  (1.854 m)   Wt 187 lb 3.2 oz (84.9 kg)   SpO2 (!) 74%   BMI 24.70 kg/m  Gen: NAD, resting comfortably HEENT: Mask not removed due to covid 19. TM normal. Bridge of nose normal. Eyelids normal.  Neck: no thyromegaly or cervical lymphadenopathy  CV: RRR no  rubs or gallops Lungs: CTAB no crackles, wheeze, rhonchi Abdomen: soft/nontender/nondistended/normal bowel sounds. No rebound or guarding.  Ext: no edema Skin: warm, dry Neuro: grossly normal, moves all extremities, PERRLA    Assessment and Plan  73 y.o. male presenting for annual physical.  Health Maintenance counseling: 1. Anticipatory guidance: Patient counseled regarding regular dental exams q6 months, eye exams yearly with known retinopathy,  avoiding smoking and second hand smoke , limiting alcohol to 2 beverages per day . Does not drink at all    2. Risk factor reduction:  Advised patient of need for regular exercise and diet rich and fruits and  vegetables to reduce risk of heart attack and stroke. Exercise- Walks 2 miles a day. Diet-working on low carb diet with portion control.  Weight up slightly and even though he is walking feels like otherwise less active- misses some days walking. Discussed even walking in place Wt Readings from Last 3 Encounters:  02/15/19 187 lb 3.2 oz (84.9 kg)  11/23/18 183 lb 3.2 oz (83.1 kg)  10/26/18 184 lb (83.5 kg)  3. Immunizations/screenings/ancillary studies- up to date.  Immunization History  Administered Date(s) Administered  . Fluad Quad(high Dose 65+) 11/23/2018  . Influenza Split 01/23/2012, 11/19/2012  . Influenza Whole 11/26/2007, 12/14/2009  . Influenza, High Dose  Seasonal PF 02/01/2014, 02/06/2015, 02/07/2016, 12/06/2016  . Influenza, Seasonal, Injecte, Preservative Fre 01/05/2009  . Influenza,inj,Quad PF,6+ Mos 11/19/2012  . Influenza-Unspecified 01/05/2009, 11/27/2017, 11/23/2018  . Pneumococcal Conjugate-13 06/02/2013  . Pneumococcal Polysaccharide-23 03/11/2004, 06/29/2009  . Td 03/11/2002  . Tetanus 03/10/2013  . Zoster Recombinat (Shingrix) 06/10/2017, 10/01/2017  4. Prostate cancer screening- nocturia twice a nigh tbut stable for years. We opted to monitor without labs as no recent change in urinary symptoms Lab Results  Component Value Date   PSA 0.67 02/07/2016   PSA 0.54 02/06/2015   PSA 0.56 02/01/2014   5. Colon cancer screening - we reviewed last colonoscopy report today and unfortunately had adenomatous polyp in 2010- he states he has had several since 2010 that did not have polyps but we do not have records of this. From referral "Patient wants video visit to discuss prior colonoscopy findings. He reports having 2-3 colonoscopies since 2010 that he reports were normal but I do not have record of that- I want him to review with GI physician- as he states all were done with Dr. Olevia Perches" 6. Skin cancer screening- Seen in the past by dermatology last visit in 2008. advised  regular sunscreen use. Denies worrisome, changing, or new skin lesions.  7. Former  Smoker- 60 pack years but quit in 2003- 2005 CT scan abdomen without obvious AAA- no further screening. Will get urine with labs   Status of chronic or acute concerns   #hypertension S: controlled on Amlodipine 10 mg daily. Not checking BP at home. Denies HA, dizziness, visual changes. Following low sodium diet. Denies ankle swelling.  A/P:   Stable. Continue current medications.     #hyperlipidemia S:  controlled on Simvastatin 20 mg daily. Denies side effetcts.  Lab Results  Component Value Date   CHOL 119 02/09/2018   HDL 33.40 (L) 02/09/2018   LDLCALC 57 02/09/2018   TRIG 140.0 02/09/2018   CHOLHDL 4 02/09/2018   A/P: Excellent control last year-update lipid panel today   # Diabetes S: previously  poorly controlled on Glimepiride 2 mg and Metformin XR 500 mg 2 tablets daily.   At last visit suggested increasing glimepiride to 4 mg and adding Januvia 50 mg- works for him as long as he "behaves"  Lab Results  Component Value Date   HGBA1C 9.2 10/22/2018   HGBA1C 7.0 (A) 02/09/2018   HGBA1C 6.9 (H) 06/24/2017  CBGs- Checks BG occasionally. Last BG was 134 fasting. Denies hypoglycemic episodes.  Exercise and diet- Goes for 2 mile walk almost daily when not raining. Watching what he eats, trying to eat low carb/low sugar. Walking new dog.  A/P:  Hopefully with improved control-update A1c today   # Depression S: Taking Bupropion 150 mg BID. Has had improvement in symptoms.  Stopped taking Pristiq d/t side effects, "funny feeling".   PHQ-9 of 7 but most of the symptoms are from feeling tired/poor sleep-no anhedonia and minimal depressed mood. A/P: Stable would still consider this in full remission. Continue current medications with Wellbutrin alone   #psoriatic arthritis- stable on remicade monthly with labs every 8 weeks. Tramadol throught me.   #thrombocytosis/leukopenia- long term issue but  stable on recent labs. Continue to monitor. Gets labs q8 weeks with rheumatology   Coronary artery disease-patient is compliant with aspirin, statin.  Has nitroglycerin on hand but does not have to use   Dyshidrotic eczema-Lidex as needed   Stage 3 chronic kidney disease-unfortunately did not tolerate ACE inhibitor.  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 Also fluconazole for thrush from [facility]. On long term Bactrim  Cialis effective for ED- requests refill. Knows cannot use this along with nitroglycerin.   Recommended follow up: Return in about 6 months (around 08/16/2019) for DM follow up, f/u HTN.  Lab/Order associations: fasting   ICD-10-CM   1. Preventative health care  Z00.00   2. Thrombocytosis ([place])  D47.3   3. Recurrent major depressive disorder, in full remission ([place])  F33.42   4. Psoriatic arthritis ([place])  L40.50   5. Leukopenia, unspecified type  D72.819   6. Controlled type 2 diabetes mellitus with retinopathy, without long-term current use of insulin, macular edema presence unspecified, unspecified laterality, unspecified retinopathy severity ([place])  E11.319   7. Coronary artery disease involving native coronary artery of native heart without angina pectoris  I25.10   8. Dyshidrotic eczema  L30.1   9. Stage 3 chronic kidney disease, unspecified whether stage 3a or 3b CKD  N18.30   10. Hyperlipidemia associated with type 2 diabetes mellitus (HCC)  E11.69    E78.5   11. Hypertension associated with diabetes ([place])  E11.59    I10   12. Mild nonproliferative diabetic retinopathy of both eyes without  macular edema associated with type 2 diabetes mellitus ([place])  [id number]     No orders of the defined types were placed in this encounter.   Return precautions advised.  Garret Reddish, MD

## 2019-02-15 NOTE — Addendum Note (Signed)
Addended by: Tomi Likens on: 02/15/2019 10:11 AM   Modules accepted: Orders

## 2019-05-05 DIAGNOSIS — Z79899 Other long term (current) drug therapy: Secondary | ICD-10-CM | POA: Diagnosis not present

## 2019-05-05 DIAGNOSIS — L4050 Arthropathic psoriasis, unspecified: Secondary | ICD-10-CM | POA: Diagnosis not present

## 2019-05-05 LAB — CBC AND DIFFERENTIAL
HCT: 51 (ref 41–53)
Hemoglobin: 17.3 (ref 13.5–17.5)
Platelets: 861 — AB (ref 150–399)
WBC: 8.5

## 2019-05-05 LAB — BASIC METABOLIC PANEL
BUN: 27 — AB (ref 4–21)
Chloride: 103 (ref 99–108)
Creatinine: 1.6 — AB (ref 0.6–1.3)
Glucose: 239
Potassium: 5.9 — AB (ref 3.4–5.3)
Sodium: 138 (ref 137–147)

## 2019-05-05 LAB — HEPATIC FUNCTION PANEL
ALT: 24 (ref 10–40)
AST: 22 (ref 14–40)
Alkaline Phosphatase: 60 (ref 25–125)
Bilirubin, Total: 2.3

## 2019-05-05 LAB — CBC: RBC: 5.72 — AB (ref 3.87–5.11)

## 2019-05-05 LAB — COMPREHENSIVE METABOLIC PANEL
GFR calc Af Amer: 47
GFR calc non Af Amer: 41
Globulin: 2.3

## 2019-05-08 ENCOUNTER — Other Ambulatory Visit: Payer: Self-pay | Admitting: Family Medicine

## 2019-05-10 ENCOUNTER — Other Ambulatory Visit: Payer: Self-pay

## 2019-05-10 MED ORDER — METOPROLOL TARTRATE 25 MG PO TABS: 25.0000 mg | ORAL_TABLET | Freq: Two times a day (BID) | ORAL | 1 refills | Status: DC

## 2019-05-10 NOTE — Telephone Encounter (Signed)
Refilled lopressor

## 2019-06-01 ENCOUNTER — Telehealth: Payer: Self-pay | Admitting: Cardiovascular Disease

## 2019-06-01 NOTE — Telephone Encounter (Signed)

## 2019-06-04 ENCOUNTER — Telehealth (INDEPENDENT_AMBULATORY_CARE_PROVIDER_SITE_OTHER): Payer: Medicare PPO | Admitting: Cardiovascular Disease

## 2019-06-04 ENCOUNTER — Encounter: Payer: Self-pay | Admitting: Cardiovascular Disease

## 2019-06-04 VITALS — BP 143/76 | HR 67 | Ht 71.0 in | Wt 185.0 lb

## 2019-06-04 DIAGNOSIS — I10 Essential (primary) hypertension: Secondary | ICD-10-CM | POA: Diagnosis not present

## 2019-06-04 DIAGNOSIS — E785 Hyperlipidemia, unspecified: Secondary | ICD-10-CM

## 2019-06-04 DIAGNOSIS — I341 Nonrheumatic mitral (valve) prolapse: Secondary | ICD-10-CM | POA: Diagnosis not present

## 2019-06-04 DIAGNOSIS — Z955 Presence of coronary angioplasty implant and graft: Secondary | ICD-10-CM

## 2019-06-04 DIAGNOSIS — I2510 Atherosclerotic heart disease of native coronary artery without angina pectoris: Secondary | ICD-10-CM | POA: Diagnosis not present

## 2019-06-04 DIAGNOSIS — I25118 Atherosclerotic heart disease of native coronary artery with other forms of angina pectoris: Secondary | ICD-10-CM

## 2019-06-04 DIAGNOSIS — I340 Nonrheumatic mitral (valve) insufficiency: Secondary | ICD-10-CM

## 2019-06-04 DIAGNOSIS — Z7189 Other specified counseling: Secondary | ICD-10-CM | POA: Diagnosis not present

## 2019-06-04 DIAGNOSIS — I252 Old myocardial infarction: Secondary | ICD-10-CM | POA: Diagnosis not present

## 2019-06-04 NOTE — Patient Instructions (Signed)
Medication Instructions:  Continue all current medications.   Labwork: none  Testing/Procedures: none  Follow-Up: 6 months   Any Other Special Instructions Will Be Listed Below (If Applicable).   If you need a refill on your cardiac medications before your next appointment, please call your pharmacy.  
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
## 2019-06-08 DIAGNOSIS — M2550 Pain in unspecified joint: Secondary | ICD-10-CM | POA: Diagnosis not present

## 2019-06-08 DIAGNOSIS — L4050 Arthropathic psoriasis, unspecified: Secondary | ICD-10-CM | POA: Diagnosis not present

## 2019-06-08 DIAGNOSIS — M150 Primary generalized (osteo)arthritis: Secondary | ICD-10-CM | POA: Diagnosis not present

## 2019-06-08 DIAGNOSIS — Z6826 Body mass index (BMI) 26.0-26.9, adult: Secondary | ICD-10-CM | POA: Diagnosis not present

## 2019-06-08 DIAGNOSIS — E663 Overweight: Secondary | ICD-10-CM | POA: Diagnosis not present

## 2019-06-30 DIAGNOSIS — L4050 Arthropathic psoriasis, unspecified: Secondary | ICD-10-CM | POA: Diagnosis not present

## 2019-07-21 ENCOUNTER — Encounter: Payer: Self-pay | Admitting: Gastroenterology

## 2019-07-26 ENCOUNTER — Other Ambulatory Visit: Payer: Self-pay | Admitting: *Deleted

## 2019-07-26 DIAGNOSIS — E7800 Pure hypercholesterolemia, unspecified: Secondary | ICD-10-CM

## 2019-07-26 MED ORDER — SIMVASTATIN 20 MG PO TABS: 20.0000 mg | ORAL_TABLET | Freq: Every day | ORAL | 1 refills | Status: DC

## 2019-08-07 ENCOUNTER — Other Ambulatory Visit: Payer: Self-pay | Admitting: Family Medicine

## 2019-08-07 DIAGNOSIS — I10 Essential (primary) hypertension: Secondary | ICD-10-CM

## 2019-08-24 ENCOUNTER — Other Ambulatory Visit: Payer: Self-pay

## 2019-08-24 ENCOUNTER — Encounter: Payer: Self-pay | Admitting: Family Medicine

## 2019-08-24 ENCOUNTER — Ambulatory Visit (INDEPENDENT_AMBULATORY_CARE_PROVIDER_SITE_OTHER): Payer: Medicare PPO | Admitting: Family Medicine

## 2019-08-24 VITALS — BP 120/70 | HR 70 | Temp 98.7°F | Ht 71.0 in | Wt 187.4 lb

## 2019-08-24 DIAGNOSIS — I152 Hypertension secondary to endocrine disorders: Secondary | ICD-10-CM

## 2019-08-24 DIAGNOSIS — E11319 Type 2 diabetes mellitus with unspecified diabetic retinopathy without macular edema: Secondary | ICD-10-CM | POA: Diagnosis not present

## 2019-08-24 DIAGNOSIS — D473 Essential (hemorrhagic) thrombocythemia: Secondary | ICD-10-CM | POA: Diagnosis not present

## 2019-08-24 DIAGNOSIS — L4050 Arthropathic psoriasis, unspecified: Secondary | ICD-10-CM

## 2019-08-24 DIAGNOSIS — I2510 Atherosclerotic heart disease of native coronary artery without angina pectoris: Secondary | ICD-10-CM | POA: Diagnosis not present

## 2019-08-24 DIAGNOSIS — E1159 Type 2 diabetes mellitus with other circulatory complications: Secondary | ICD-10-CM | POA: Diagnosis not present

## 2019-08-24 DIAGNOSIS — I10 Essential (primary) hypertension: Secondary | ICD-10-CM | POA: Diagnosis not present

## 2019-08-24 DIAGNOSIS — K219 Gastro-esophageal reflux disease without esophagitis: Secondary | ICD-10-CM

## 2019-08-24 DIAGNOSIS — D75839 Thrombocytosis, unspecified: Secondary | ICD-10-CM

## 2019-08-24 DIAGNOSIS — E785 Hyperlipidemia, unspecified: Secondary | ICD-10-CM

## 2019-08-24 DIAGNOSIS — Z79899 Other long term (current) drug therapy: Secondary | ICD-10-CM | POA: Diagnosis not present

## 2019-08-24 DIAGNOSIS — F3342 Major depressive disorder, recurrent, in full remission: Secondary | ICD-10-CM

## 2019-08-24 DIAGNOSIS — E1169 Type 2 diabetes mellitus with other specified complication: Secondary | ICD-10-CM

## 2019-08-24 LAB — CBC WITH DIFFERENTIAL/PLATELET
Basophils Absolute: 0.1 10*3/uL (ref 0.0–0.1)
Basophils Relative: 1.1 % (ref 0.0–3.0)
Eosinophils Absolute: 0.3 10*3/uL (ref 0.0–0.7)
Eosinophils Relative: 4 % (ref 0.0–5.0)
HCT: 47.5 % (ref 39.0–52.0)
Hemoglobin: 16.1 g/dL (ref 13.0–17.0)
Lymphocytes Relative: 10.3 % — ABNORMAL LOW (ref 12.0–46.0)
Lymphs Abs: 0.8 10*3/uL (ref 0.7–4.0)
MCHC: 33.9 g/dL (ref 30.0–36.0)
MCV: 88.2 fl (ref 78.0–100.0)
Monocytes Absolute: 0.7 10*3/uL (ref 0.1–1.0)
Monocytes Relative: 8 % (ref 3.0–12.0)
Neutro Abs: 6.2 10*3/uL (ref 1.4–7.7)
Neutrophils Relative %: 76.6 % (ref 43.0–77.0)
Platelets: 761 10*3/uL — ABNORMAL HIGH (ref 150.0–400.0)
RBC: 5.39 Mil/uL (ref 4.22–5.81)
RDW: 14.9 % (ref 11.5–15.5)
WBC: 8.1 10*3/uL (ref 4.0–10.5)

## 2019-08-24 LAB — COMPREHENSIVE METABOLIC PANEL
ALT: 26 U/L (ref 0–53)
AST: 20 U/L (ref 0–37)
Albumin: 4.6 g/dL (ref 3.5–5.2)
Alkaline Phosphatase: 43 U/L (ref 39–117)
BUN: 23 mg/dL (ref 6–23)
CO2: 26 mEq/L (ref 19–32)
Calcium: 9.8 mg/dL (ref 8.4–10.5)
Chloride: 100 mEq/L (ref 96–112)
Creatinine, Ser: 1.6 mg/dL — ABNORMAL HIGH (ref 0.40–1.50)
GFR: 42.41 mL/min — ABNORMAL LOW (ref >60.00)
Glucose, Bld: 151 mg/dL — ABNORMAL HIGH (ref 70–99)
Potassium: 5.7 mEq/L — ABNORMAL HIGH (ref 3.5–5.1)
Sodium: 133 mEq/L — ABNORMAL LOW (ref 135–145)
Total Bilirubin: 0.6 mg/dL (ref 0.2–1.2)
Total Protein: 6.7 g/dL (ref 6.0–8.3)

## 2019-08-24 LAB — VITAMIN B12: Vitamin B-12: 277 pg/mL (ref 211–911)

## 2019-08-24 LAB — HEMOGLOBIN A1C: Hgb A1c MFr Bld: 7.9 % — ABNORMAL HIGH (ref 4.6–6.5)

## 2019-08-24 MED ORDER — FAMOTIDINE 20 MG PO TABS
20.0000 mg | ORAL_TABLET | Freq: Two times a day (BID) | ORAL | 11 refills | Status: DC | PRN
Start: 2019-08-24 — End: 2021-02-15

## 2019-08-24 NOTE — Progress Notes (Addendum)
Phone [phone] In person visit   Subjective:   Chase Anderson. is a 74 y.o. year old very pleasant male patient who presents for/with See problem oriented charting Chief Complaint  Patient presents with  . Follow-up  . Diabetes  . Hypertension  . Depression   This visit occurred during the SARS-CoV-2 public health emergency.  Safety protocols were in place, including screening questions prior to the visit, additional usage of staff PPE, and extensive cleaning of exam room while observing appropriate contact time as indicated for disinfecting solutions.   Past Medical History-  Patient Active Problem List   Diagnosis Date Noted  . Thrombocytosis ([place]) 04/03/2016    Priority: High  . Depression 03/27/2015    Priority: High  . Psoriatic arthritis ([place]) 01/25/2013    Priority: High  . Mitral valve prolapse 12/25/2010    Priority: High  . Leukocytopenia 11/24/2006    Priority: High  . Controlled type 2 diabetes mellitus with ophthalmic complication ([place]) 62/69/4854    Priority: High  . CAD (coronary artery disease) with history MI 2004- no intervention 10/06/2006    Priority: High  . Mitral valve regurgitation 02/15/2019    Priority: Medium  . Dyshidrotic eczema 02/07/2016    Priority: Medium  . CKD (chronic kidney disease), stage III ([place]) 09/15/2015    Priority: Medium  . History of BPH 12/14/2009    Priority: Medium  . Hyperlipidemia associated with type 2 diabetes mellitus ([place]) 10/06/2006    Priority: Medium  . Hypertension associated with diabetes ([place]) 10/06/2006    Priority: Medium  . GERD (gastroesophageal reflux disease) 08/24/2019    Priority: Low  . Diabetic retinopathy ([place]) 02/20/2017    Priority: Low  . ERECTILE DYSFUNCTION 11/26/2007    Priority: Low  . Candidiasis 11/25/2012  . Immunodeficiency disorder with predominant T-cell defect 11/19/2012    Medications- reviewed and updated Current Outpatient Medications  Medication Sig Dispense  Refill  . amLODipine (NORVASC) 10 MG tablet TAKE 1 TABLET BY MOUTH EVERY DAY 100 tablet 3  . aspirin EC 81 MG tablet Take 81 mg by mouth daily.    Marland Kitchen buPROPion (WELLBUTRIN SR) 150 MG 12 hr tablet Take 1 tablet (150 mg total) by mouth 2 (two) times daily. 180 tablet 3  . clindamycin (CLEOCIN) 300 MG capsule Take 300 mg by mouth as needed.     . fluconazole (DIFLUCAN) 100 MG tablet Take 100 mg by mouth daily as needed.     . fluocinonide ointment (LIDEX) 0.05 % APPLY TOPICALLY ONCE DAILY AS NEEDED 60 g 3  . glimepiride (AMARYL) 4 MG tablet TAKE 1 TABLET BY MOUTH DAILY BEFORE BREAKFAST 30 tablet 5  . InFLIXimab (REMICADE IV) Inject into the vein every 8 (eight) weeks.     Marland Kitchen JANUVIA 50 MG tablet TAKE 1 TABLET BY MOUTH DAILY 30 tablet 5  . metFORMIN (GLUCOPHAGE-XR) 500 MG 24 hr tablet TAKE 2 TABLETS BY MOUTH DAILY WITH BREAKFAST 180 tablet 3  . metoprolol tartrate (LOPRESSOR) 25 MG tablet Take 1 tablet (25 mg total) by mouth 2 (two) times daily. 180 tablet 1  . nitroGLYCERIN (NITROSTAT) 0.4 MG SL tablet Place 0.4 mg under the tongue every 5 (five) minutes as needed for chest pain. Up to three time and then call the doctor    . simvastatin (ZOCOR) 20 MG tablet Take 1 tablet (20 mg total) by mouth at bedtime. 90 tablet 1  . sulfamethoxazole-trimethoprim (BACTRIM DS,SEPTRA DS) 800-160 MG tablet Take 1 tablet by mouth  every Monday, Wednesday, and Friday.  5  . tadalafil (CIALIS) 20 MG tablet Take 1 tablet (20 mg total) by mouth daily as needed for erectile dysfunction (no nitroglycerin within 72 hours). 10 tablet 5  . traMADol (ULTRAM) 50 MG tablet TAKE 1 TABLET BY MOUTH TWICE DAILY AS NEEDED 60 tablet 5  . valACYclovir (VALTREX) 500 MG tablet Take 1 tablet (500 mg total) by mouth as needed. (Patient taking differently: Take 500 mg by mouth 3 (three) times daily. ) 60 tablet 3  . famotidine (PEPCID) 20 MG tablet Take 1 tablet (20 mg total) by mouth 2 (two) times daily as needed for heartburn or indigestion.  60 tablet 11   No current facility-administered medications for this visit.     Objective:  BP 120/70   Pulse 70   Temp 98.7 F (37.1 C)   Ht 5\' 11"  (1.803 m)   Wt 187 lb 6.4 oz (85 kg)   SpO2 97%   BMI 26.14 kg/m  Gen: NAD, resting comfortably CV: RRR  Lungs: CTAB no crackles, wheeze, rhonchi Ext: no edema Skin: warm, dry     Assessment and Plan   # Diabetes S: Medication:Glimepiride 4 mg and Metformin 500 mg extended release 2 tablets daily and Januvia 50 mg.  Dosage is limited by CKD stage III CBGs- 134 this Am Exercise and diet- tries to be busy in daytime but has dropped off his walk- should restart Lab Results  Component Value Date   HGBA1C 6.9 (H) 02/15/2019   HGBA1C 9.2 10/22/2018   HGBA1C 7.0 (A) 02/09/2018   A/P: Hopefully A1c remains well controlled-may have slipped up some with not walking recently-as long as A1c is below 8 I think focusing on dietary changes and adding walking back pain is reasonable if above this we may need to consider medication changes  #hypertension S: medication: Amlodipine 10 mg Home readings #s:  Does not check but has cuff BP Readings from Last 3 Encounters:  08/24/19 120/70  06/04/19 (!) 143/76  02/15/19 136/78  A/P: Stable. Continue current medications.   #hyperlipidemia S: Medication:Simvastatin 20 mg Lab Results  Component Value Date   CHOL 100 02/15/2019   HDL 31.10 (L) 02/15/2019   LDLCALC 42 02/15/2019   TRIG 135.0 02/15/2019   CHOLHDL 3 02/15/2019   A/P: Stable. Continue current medications. LDL at goal under 70 in fact even under 55 which is excellent   # Depression S: Medication:Bupropion 150 mg twice daily -"Funny feeling" on Pristiq in the past Depression screen [facility] 2/9 08/24/2019 02/15/2019 11/23/2018  Decreased Interest 0 0 0  Down, Depressed, Hopeless 0 1 0  PHQ - 2 Score 0 1 0  Altered sleeping 0 3 -  Tired, decreased energy 0 3 -  Change in appetite 0 0 -  Feeling bad or failure about yourself  0 0  -  Trouble concentrating 0 0 -  Moving slowly or fidgety/restless 0 0 -  Suicidal thoughts 0 0 -  PHQ-9 Score 0 7 -  Difficult doing work/chores Not difficult at all Somewhat difficult -  Some recent data might be hidden  A/P: full remission/Reasonable control today on bupropion alone-continue current medication  #Psoriatic arthritis S:Continues on Remicade and labs every 16 weeks.  Follows up closely with rheumatology.  I do prescribe his tramadol if needed- rheumatology prescribed most recently A/P: Reasonable control-continue current medication  #Thrombocytosis- follows with [facility] S: Chronically elevated platelets-has seen oncology/hematology at [facility] within the year.  Possibly related to psoriatic  arthritis or Remicade A/P: We will continue to monitor-update CBC with differential today  #Coronary artery disease-patient compliant with aspirin and statin.  Has nitroglycerin on hand but does not use.  #CKD stage III-thankfully no elevated microalbumin to creatinine ratio as he has not tolerated ACE inhibitor in the past.  Could consider ARB if needed    # GERD S: Patient has been noticing some acid reflux at home-particularly with heavier meals or tomato based foods.  Feels like a burning sensation in his throat and throat B12 levels related to PPI use: mainly with dinner. tums helps some. Asks about pepcid A/P: acid reflux mainly with dinner- will trial pepcid before dinner- but wrote so he can take twice a day if needed   Recommended follow up: Return in about 6 months (around 02/23/2020) for physical or sooner if needed. Future Appointments  Date Time Provider [place]  08/31/2019  8:30 AM [facility] PREVISIT RM50 [facility]  09/15/2019  8:00 AM Armbruster, Carlota Raspberry, MD [facility]  11/29/2019  2:20 PM Herminio Commons, MD [facility]    Lab/Order associations: fasting. On metformin so check b12   ICD-10-CM   1. Controlled type 2 diabetes mellitus  with retinopathy, without long-term current use of insulin, macular edema presence unspecified, unspecified laterality, unspecified retinopathy severity (HCC)  E11.319 Hemoglobin A1c  2. Hypertension associated with diabetes ([place])  E11.59 Comprehensive metabolic panel   U43 CBC with Differential/Platelet  3. Psoriatic arthritis (HCC) Chronic L40.50   4. Thrombocytosis (HCC) Chronic D47.3   5. Hyperlipidemia associated with type 2 diabetes mellitus (HCC)  E11.69    E78.5   6. Recurrent major depressive disorder, in full remission ([place])  F33.42   7. Gastroesophageal reflux disease without esophagitis  K21.9   8. Coronary artery disease involving native coronary artery of native heart without angina pectoris  I25.10   9. High risk medication use  Z79.899 Vitamin B12    Meds ordered this encounter  Medications  . famotidine (PEPCID) 20 MG tablet    Sig: Take 1 tablet (20 mg total) by mouth 2 (two) times daily as needed for heartburn or indigestion.    Dispense:  60 tablet    Refill:  11   Return precautions advised.  Garret Reddish, MD

## 2019-08-24 NOTE — Patient Instructions (Addendum)
Health Maintenance Due  Topic Date Due  . COVID-19 Vaccine (1)- will call us with information or wife can bring by tomorrow at her visit Never done   Try pepcid before dinner for reflux  Please stop by lab before you go If you have mychart- we will send your results within 3 business days of [place] receiving them.  If you do not have mychart- we will call you about results within 5 business days of [place] receiving them.

## 2019-08-25 DIAGNOSIS — Z79899 Other long term (current) drug therapy: Secondary | ICD-10-CM | POA: Diagnosis not present

## 2019-08-25 DIAGNOSIS — L4050 Arthropathic psoriasis, unspecified: Secondary | ICD-10-CM | POA: Diagnosis not present

## 2019-08-31 ENCOUNTER — Other Ambulatory Visit: Payer: Self-pay

## 2019-08-31 ENCOUNTER — Ambulatory Visit (AMBULATORY_SURGERY_CENTER): Payer: Self-pay | Admitting: *Deleted

## 2019-08-31 VITALS — Ht 73.0 in | Wt 188.0 lb

## 2019-08-31 DIAGNOSIS — Z1211 Encounter for screening for malignant neoplasm of colon: Secondary | ICD-10-CM

## 2019-08-31 DIAGNOSIS — Z86010 Personal history of colonic polyps: Secondary | ICD-10-CM

## 2019-08-31 MED ORDER — SUTAB 1479-225-188 MG PO TABS: 24.0000 | ORAL_TABLET | ORAL | 0 refills | Status: DC

## 2019-08-31 NOTE — Progress Notes (Signed)
No egg or soy allergy known to patient  No issues with past sedation with any surgeries  or procedures, no intubation problems  No diet pills per patient No home 02 use per patient  No blood thinners per patient  Pt denies issues with constipation  No A fib or A flutter  EMMI video sent to pt's e mail   Wife in [place] today- COVID 19 guidelines implemented in [place] today    covid vaccines completed 05-05-2019  Due to the COVID-19 pandemic we are asking patients to follow these guidelines. Please only bring one care partner. Please be aware that your care partner may wait in the car in the parking lot or if they feel like they will be too hot to wait in the car, they may wait in the lobby on the 4th floor. All care partners are required to wear a mask the entire time (we do not have any that we can provide them), they need to practice social distancing, and we will do a Covid check for all patient's and care partners when you arrive. Also we will check their temperature and your temperature. If the care partner waits in their car they need to stay in the parking lot the entire time and we will call them on their cell phone when the patient is ready for discharge so they can bring the car to the front of the building. Also all patient's will need to wear a mask into building.

## 2019-09-03 ENCOUNTER — Encounter: Payer: Self-pay | Admitting: Gastroenterology

## 2019-09-15 ENCOUNTER — Other Ambulatory Visit: Payer: Self-pay

## 2019-09-15 ENCOUNTER — Ambulatory Visit (AMBULATORY_SURGERY_CENTER): Payer: Medicare PPO | Admitting: Gastroenterology

## 2019-09-15 ENCOUNTER — Encounter: Payer: Self-pay | Admitting: Gastroenterology

## 2019-09-15 VITALS — BP 129/65 | HR 81 | Temp 96.4°F | Resp 19 | Ht 73.0 in | Wt 188.0 lb

## 2019-09-15 DIAGNOSIS — D120 Benign neoplasm of cecum: Secondary | ICD-10-CM | POA: Diagnosis not present

## 2019-09-15 DIAGNOSIS — D126 Benign neoplasm of colon, unspecified: Secondary | ICD-10-CM | POA: Diagnosis not present

## 2019-09-15 DIAGNOSIS — Z86010 Personal history of colonic polyps: Secondary | ICD-10-CM | POA: Diagnosis not present

## 2019-09-15 DIAGNOSIS — Z1211 Encounter for screening for malignant neoplasm of colon: Secondary | ICD-10-CM | POA: Diagnosis not present

## 2019-09-15 DIAGNOSIS — I2510 Atherosclerotic heart disease of native coronary artery without angina pectoris: Secondary | ICD-10-CM | POA: Diagnosis not present

## 2019-09-15 DIAGNOSIS — D122 Benign neoplasm of ascending colon: Secondary | ICD-10-CM

## 2019-09-15 DIAGNOSIS — D128 Benign neoplasm of rectum: Secondary | ICD-10-CM | POA: Diagnosis not present

## 2019-09-15 DIAGNOSIS — D123 Benign neoplasm of transverse colon: Secondary | ICD-10-CM

## 2019-09-15 DIAGNOSIS — N183 Chronic kidney disease, stage 3 unspecified: Secondary | ICD-10-CM | POA: Diagnosis not present

## 2019-09-15 DIAGNOSIS — E1122 Type 2 diabetes mellitus with diabetic chronic kidney disease: Secondary | ICD-10-CM | POA: Diagnosis not present

## 2019-09-15 DIAGNOSIS — D124 Benign neoplasm of descending colon: Secondary | ICD-10-CM | POA: Diagnosis not present

## 2019-09-15 DIAGNOSIS — I129 Hypertensive chronic kidney disease with stage 1 through stage 4 chronic kidney disease, or unspecified chronic kidney disease: Secondary | ICD-10-CM | POA: Diagnosis not present

## 2019-09-15 MED ORDER — SODIUM CHLORIDE 0.9 % IV SOLN
500.0000 mL | Freq: Once | INTRAVENOUS | Status: DC
Start: 2019-09-15 — End: 2019-09-15

## 2019-09-15 NOTE — Op Note (Signed)
[facility] Patient Name: Chase Anderson Procedure Date: 09/15/2019 7:46 AM MRN: [id number] Endoscopist: Remo Lipps P. Havery Moros , MD Age: 74 Referring MD:  Date of Birth: 1945/07/01 Gender: Male Account #: [id number] Procedure:                Colonoscopy Indications:              Screening for colorectal malignant neoplasm Medicines:                Monitored Anesthesia Care Procedure:                Pre-Anesthesia Assessment:                           - Prior to the procedure, a History and Physical                            was performed, and patient medications and                            allergies were reviewed. The patient's tolerance of                            previous anesthesia was also reviewed. The risks                            and benefits of the procedure and the sedation                            options and risks were discussed with the patient.                            All questions were answered, and informed consent                            was obtained. Prior Anticoagulants: The patient has                            taken no previous anticoagulant or antiplatelet                            agents. ASA Grade Assessment: III - A patient with                            severe systemic disease. After reviewing the risks                            and benefits, the patient was deemed in                            satisfactory condition to undergo the procedure.                           After obtaining informed consent, the colonoscope  was passed under direct vision. Throughout the                            procedure, the patient's blood pressure, pulse, and                            oxygen saturations were monitored continuously. The                            Colonoscope was introduced through the anus and                            advanced to the the cecum, identified by                            appendiceal orifice  and ileocecal valve. The                            colonoscopy was performed without difficulty. The                            patient tolerated the procedure well. The quality                            of the bowel preparation was good. The ileocecal                            valve, appendiceal orifice, and rectum were                            photographed. Scope In: 7:57:24 AM Scope Out: 8:24:45 AM Scope Withdrawal Time: 0 hours 21 minutes 5 seconds  Total Procedure Duration: 0 hours 27 minutes 21 seconds  Findings:                 The perianal and digital rectal examinations were                            normal.                           A 3 mm polyp was found in the cecum. The polyp was                            sessile. The polyp was removed with a cold snare.                            Resection and retrieval were complete.                           A 7 mm polyp was found in the ascending colon. The                            polyp was sessile. The polyp was removed with a  cold snare. Resection and retrieval were complete.                           Two sessile polyps were found in the hepatic                            flexure. The polyps were 3 mm in size. These polyps                            were removed with a cold snare. Resection and                            retrieval were complete.                           Three sessile polyps were found in the transverse                            colon. The polyps were 3 to 4 mm in size. These                            polyps were removed with a cold snare. Resection                            and retrieval were complete.                           Three sessile polyps were found in the splenic                            flexure. The polyps were 3 to 5 mm in size. These                            polyps were removed with a cold snare. Resection                            and retrieval were  complete.                           A 3 mm polyp was found in the descending colon. The                            polyp was sessile. The polyp was removed with a                            cold snare. Resection and retrieval were complete.                           A 3 mm polyp was found in the rectum. The polyp was                            sessile. The polyp was removed with a cold  snare.                            Resection and retrieval were complete.                           Multiple medium-mouthed diverticula were found in                            the sigmoid colon.                           Internal hemorrhoids were found during retroflexion.                           The exam was otherwise without abnormality. Complications:            No immediate complications. Estimated blood loss:                            Minimal. Estimated Blood Loss:     Estimated blood loss was minimal. Impression:               - One 3 mm polyp in the cecum, removed with a cold                            snare. Resected and retrieved.                           - One 7 mm polyp in the ascending colon, removed                            with a cold snare. Resected and retrieved.                           - Two 3 mm polyps at the hepatic flexure, removed                            with a cold snare. Resected and retrieved.                           - Three 3 to 4 mm polyps in the transverse colon,                            removed with a cold snare. Resected and retrieved.                           - Three 3 to 5 mm polyps at the splenic flexure,                            removed with a cold snare. Resected and retrieved.                           - One 3 mm polyp in the descending colon, removed  with a cold snare. Resected and retrieved.                           - One 3 mm polyp in the rectum, removed with a cold                            snare. Resected and retrieved.                            - Diverticulosis in the sigmoid colon.                           - Internal hemorrhoids.                           - The examination was otherwise normal. Recommendation:           - Patient has a contact number available for                            emergencies. The signs and symptoms of potential                            delayed complications were discussed with the                            patient. Return to normal activities tomorrow.                            Written discharge instructions were provided to the                            patient.                           - Resume previous diet.                           - Continue present medications.                           - Await pathology results. Remo Lipps P. Jolynda Townley, MD 09/15/2019 8:33:46 AM This report has been signed electronically.

## 2019-09-15 NOTE — Patient Instructions (Signed)
Handout on polyps and diverticulosis given    YOU HAD AN ENDOSCOPIC PROCEDURE TODAY AT THE [facility]:   Refer to the procedure report that was given to you for any specific questions about what was found during the examination.  If the procedure report does not answer your questions, please call your gastroenterologist to clarify.  If you requested that your care partner not be given the details of your procedure findings, then the procedure report has been included in a sealed envelope for you to review at your convenience later.  YOU SHOULD EXPECT: Some feelings of bloating in the abdomen. Passage of more gas than usual.  Walking can help get rid of the air that was put into your GI tract during the procedure and reduce the bloating. If you had a lower endoscopy (such as a colonoscopy or flexible sigmoidoscopy) you may notice spotting of blood in your stool or on the toilet paper. If you underwent a bowel prep for your procedure, you may not have a normal bowel movement for a few days.  Please Note:  You might notice some irritation and congestion in your nose or some drainage.  This is from the oxygen used during your procedure.  There is no need for concern and it should clear up in a day or so.  SYMPTOMS TO REPORT IMMEDIATELY:   Following lower endoscopy (colonoscopy or flexible sigmoidoscopy):  Excessive amounts of blood in the stool  Significant tenderness or worsening of abdominal pains  Swelling of the abdomen that is new, acute  Fever of 100F or higher   For urgent or emergent issues, a gastroenterologist can be reached at any hour by calling [phone]. Do not use MyChart messaging for urgent concerns.    DIET:  We do recommend a small meal at first, but then you may proceed to your regular diet.  Drink plenty of fluids but you should avoid alcoholic beverages for 24 hours.  ACTIVITY:  You should plan to take it easy for the rest of today and you should NOT  DRIVE or use heavy machinery until tomorrow (because of the sedation medicines used during the test).    FOLLOW UP: Our staff will call the number listed on your records 48-72 hours following your procedure to check on you and address any questions or concerns that you may have regarding the information given to you following your procedure. If we do not reach you, we will leave a message.  We will attempt to reach you two times.  During this call, we will ask if you have developed any symptoms of COVID 19. If you develop any symptoms (ie: fever, flu-like symptoms, shortness of breath, cough etc.) before then, please call [phone].  If you test positive for Covid 19 in the 2 weeks post procedure, please call and report this information to us.    If any biopsies were taken you will be contacted by phone or by letter within the next 1-3 weeks.  Please call us at [phone] if you have not heard about the biopsies in 3 weeks.    SIGNATURES/CONFIDENTIALITY: You and/or your care partner have signed paperwork which will be entered into your electronic medical record.  These signatures attest to the fact that that the information above on your After Visit Summary has been reviewed and is understood.  Full responsibility of the confidentiality of this discharge information lies with you and/or your care-partner. 

## 2019-09-15 NOTE — Progress Notes (Signed)
Pt's states no medical or surgical changes since previsit or office visit.  CW - vitals 

## 2019-09-15 NOTE — Progress Notes (Signed)
Called to room to assist during endoscopic procedure.  Patient ID and intended procedure confirmed with present staff. Received instructions for my participation in the procedure from the performing physician.  

## 2019-09-15 NOTE — Progress Notes (Signed)
Report to PACU, RN, vss, BBS= Clear.  

## 2019-09-17 ENCOUNTER — Telehealth: Payer: Self-pay

## 2019-09-17 NOTE — Telephone Encounter (Signed)
Could not leave message, no VM set up.

## 2019-09-17 NOTE — Telephone Encounter (Signed)
Second follow up call attempt, no answer, LM

## 2019-10-06 ENCOUNTER — Other Ambulatory Visit: Payer: Self-pay | Admitting: Family Medicine

## 2019-10-06 DIAGNOSIS — F3342 Major depressive disorder, recurrent, in full remission: Secondary | ICD-10-CM

## 2019-10-20 DIAGNOSIS — Z79899 Other long term (current) drug therapy: Secondary | ICD-10-CM | POA: Diagnosis not present

## 2019-10-20 DIAGNOSIS — L4050 Arthropathic psoriasis, unspecified: Secondary | ICD-10-CM | POA: Diagnosis not present

## 2019-11-05 ENCOUNTER — Other Ambulatory Visit: Payer: Self-pay | Admitting: Family Medicine

## 2019-11-09 ENCOUNTER — Other Ambulatory Visit: Payer: Self-pay | Admitting: *Deleted

## 2019-11-09 MED ORDER — METOPROLOL TARTRATE 25 MG PO TABS: 25.0000 mg | ORAL_TABLET | Freq: Two times a day (BID) | ORAL | 1 refills | Status: DC

## 2019-11-29 ENCOUNTER — Ambulatory Visit: Payer: Medicare PPO | Admitting: Cardiovascular Disease

## 2019-11-30 ENCOUNTER — Encounter: Payer: Self-pay | Admitting: Cardiology

## 2019-11-30 ENCOUNTER — Ambulatory Visit: Payer: Medicare PPO | Admitting: Cardiology

## 2019-11-30 VITALS — BP 132/70 | HR 71 | Ht 71.0 in | Wt 186.0 lb

## 2019-11-30 DIAGNOSIS — I25119 Atherosclerotic heart disease of native coronary artery with unspecified angina pectoris: Secondary | ICD-10-CM | POA: Diagnosis not present

## 2019-11-30 DIAGNOSIS — I340 Nonrheumatic mitral (valve) insufficiency: Secondary | ICD-10-CM | POA: Diagnosis not present

## 2019-11-30 DIAGNOSIS — E782 Mixed hyperlipidemia: Secondary | ICD-10-CM

## 2019-11-30 NOTE — Patient Instructions (Signed)
Your physician wants you to follow-up in: [place]   Your physician recommends that you continue on your current medications as directed. Please refer to the Current Medication list given to you today.  Your physician has requested that you have an echocardiogram. Echocardiography is a painless test that uses sound waves to create images of your heart. It provides your doctor with information about the size and shape of your heart and how well your heart's chambers and valves are working. This procedure takes approximately one hour. There are no restrictions for this procedure.  Thank you for choosing [facility]!!

## 2019-11-30 NOTE — Progress Notes (Signed)
Cardiology Office Note  Date: 11/30/2019   ID: Chase Cord., DOB 10-Feb-1946, MRN [id number]  PCP:  Marin Olp, MD  Cardiologist:  Rozann Lesches, MD Electrophysiologist:  None   Chief Complaint  Patient presents with  . Cardiac follow-up    History of Present Illness: Chase Anderson. is a 74 y.o. male former patient of Dr. Bronson Ing now presenting to establish follow-up with me.  I reviewed his records and updated the chart.  He was last assessed via telehealth encounter in March.  He presents for a routine visit.  Reports no change in stamina, no exertional chest pain.  He walks 2 miles each day for exercise.  No palpitations or syncope.  Last echocardiogram in September 2020 revealed LVEF 65 to 70%, normal RV contraction, mild bileaflet mitral prolapse with moderate to severe mitral regurgitation.  I discussed with him these results and also plan for a follow-up echocardiogram.  He states that he is aware of his valvular disease and the possibility of surgical repair has been discussed with him as well.  I reviewed his medications, he reports compliance and no obvious intolerances.  He follows with Dr. Yong Channel.  I reviewed his most recent lab work.  I personally reviewed his ECG today which shows sinus rhythm with prolonged PR interval, nonspecific T wave abnormalities.  Past Medical History:  Diagnosis Date  . Allergic rhinitis   . Anxiety   . Atopic dermatitis   . CAD (coronary artery disease)    Mild at cardiac catheterization 2004  . CKD (chronic kidney disease) stage 3, GFR 30-59 ml/min   . Collagen vascular disease ([place])   . Erectile dysfunction   . Essential hypertension   . GERD (gastroesophageal reflux disease)   . Hyperlipidemia   . Myocardial infarction ([place]) 2004   Suspected plaque rupture/aborted inferior infarct 2004, nonobstructive CAD managed medically  . Osteoarthritis   . Type 2 diabetes mellitus ([place])     Past Surgical History:    Procedure Laterality Date  . COLONOSCOPY    . ESOPHAGOGASTRODUODENOSCOPY  04/23/2005  . KNEE ARTHROSCOPY     right  . POLYPECTOMY  2008   TA x 4   . TONSILLECTOMY    . TOOTH EXTRACTION     x 4 removed   . UPPER GASTROINTESTINAL ENDOSCOPY      Current Outpatient Medications  Medication Sig Dispense Refill  . amLODipine (NORVASC) 10 MG tablet TAKE 1 TABLET BY MOUTH EVERY DAY 100 tablet 3  . aspirin EC 81 MG tablet Take 81 mg by mouth daily.    Marland Kitchen buPROPion (WELLBUTRIN SR) 150 MG 12 hr tablet TAKE 1 TABLET BY MOUTH TWICE DAILY 180 tablet 3  . clindamycin (CLEOCIN) 300 MG capsule Take 300 mg by mouth as needed.     . famotidine (PEPCID) 20 MG tablet Take 1 tablet (20 mg total) by mouth 2 (two) times daily as needed for heartburn or indigestion. 60 tablet 11  . fluconazole (DIFLUCAN) 100 MG tablet Take 100 mg by mouth daily as needed.     . fluocinonide ointment (LIDEX) 0.05 % APPLY TOPICALLY ONCE DAILY AS NEEDED 60 g 3  . glimepiride (AMARYL) 4 MG tablet TAKE 1 TABLET BY MOUTH DAILY BEFORE BREAKFAST 30 tablet 5  . glucose blood (ACCU-CHEK AVIVA PLUS) test strip     . glucose blood (ACCU-CHEK AVIVA PLUS) test strip USE TO TEST BLOOD SUGAR ONCE DAILY    . InFLIXimab (REMICADE IV) Inject into  the vein every 8 (eight) weeks.     Marland Kitchen JANUVIA 50 MG tablet TAKE 1 TABLET BY MOUTH DAILY 30 tablet 5  . metFORMIN (GLUCOPHAGE-XR) 500 MG 24 hr tablet TAKE 2 TABLETS BY MOUTH DAILY WITH BREAKFAST 180 tablet 3  . metoprolol tartrate (LOPRESSOR) 25 MG tablet Take 1 tablet (25 mg total) by mouth 2 (two) times daily. 180 tablet 1  . nitroGLYCERIN (NITROSTAT) 0.4 MG SL tablet Place 0.4 mg under the tongue every 5 (five) minutes as needed for chest pain. Up to three time and then call the doctor    . simvastatin (ZOCOR) 20 MG tablet Take 1 tablet (20 mg total) by mouth at bedtime. 90 tablet 1  . sulfamethoxazole-trimethoprim (BACTRIM DS,SEPTRA DS) 800-160 MG tablet Take 1 tablet by mouth every Monday, Wednesday,  and Friday.  5  . tadalafil (CIALIS) 20 MG tablet Take 1 tablet (20 mg total) by mouth daily as needed for erectile dysfunction (no nitroglycerin within 72 hours). 10 tablet 5  . traMADol (ULTRAM) 50 MG tablet TAKE 1 TABLET BY MOUTH TWICE DAILY AS NEEDED 60 tablet 5  . valACYclovir (VALTREX) 500 MG tablet Take 1 tablet (500 mg total) by mouth as needed. (Patient taking differently: Take 500 mg by mouth 3 (three) times daily. ) 60 tablet 3   No current facility-administered medications for this visit.   Allergies:  Codeine, Lisinopril, Penicillins, and Pristiq [desvenlafaxine succinate er]   ROS: No syncope.   Physical Exam: VS:  BP 132/70   Pulse 71   Ht 5\' 11"  (1.803 m)   Wt 186 lb (84.4 kg)   SpO2 97%   BMI 25.94 kg/m , BMI Body mass index is 25.94 kg/m.  Wt Readings from Last 3 Encounters:  11/30/19 186 lb (84.4 kg)  09/15/19 188 lb (85.3 kg)  08/31/19 188 lb (85.3 kg)    General: Patient appears comfortable at rest. HEENT: Conjunctiva and lids normal, wearing a mask. Neck: Supple, no elevated JVP or carotid bruits, no thyromegaly. Lungs: Clear to auscultation, nonlabored breathing at rest. Cardiac: Regular rate and rhythm, no S3, 3/6 high-pitched mid-to-late systolic murmur following click at apex, no pericardial rub. Abdomen: Soft, nontender, bowel sounds present. Extremities: No pitting edema, distal pulses 2+.  ECG:  An ECG dated 10/26/2018 was personally reviewed today and demonstrated:  Sinus tachycardia with lead motion artifact, decreased R wave progression, nonspecific T wave changes.  Recent Labwork: 08/24/2019: ALT 26; AST 20; BUN 23; Creatinine, Ser 1.60; Hemoglobin 16.1; Platelets 761.0; Potassium 5.7 No hemolysis seen; Sodium 133     Component Value Date/Time   CHOL 100 02/15/2019 1010   TRIG 135.0 02/15/2019 1010   HDL 31.10 (L) 02/15/2019 1010   CHOLHDL 3 02/15/2019 1010   VLDL 27.0 02/15/2019 1010   LDLCALC 42 02/15/2019 1010    Other Studies Reviewed  Today:  Exercise Myoview 09/01/2015:  There was no ST segment deviation noted during stress.  Defect 1: There is a medium defect of mild severity present in the basal inferoseptal, basal inferior, mid inferoseptal, mid inferior, apical septal and apical inferior location.  Findings consistent with prior myocardial infarction.  This is a low risk study.  Nuclear stress EF: 60%.  Echocardiogram 12/09/2018: 1. Left ventricular ejection fraction, by visual estimation, is 65 to  70%. The left ventricle has normal function. Normal left ventricular size.  There is mildly increased left ventricular hypertrophy.  2. Left ventricular diastolic Doppler parameters are consistent with  impaired relaxation pattern of LV  diastolic filling.  3. Global right ventricle has normal systolic function.The right  ventricular size is normal. No increase in right ventricular wall  thickness.  4. Left atrial size was mildly dilated.  5. Right atrial size was normal.  6. Mild mitral annular calcification.  7. Mild to moderate aortic valve annular calcification.  8. The mitral valve is degenerative. Moderate to severe mitral valve  regurgitation.  9. Mild bileaflet mitral prolapse.  10. The tricuspid valve is grossly normal. Tricuspid valve regurgitation  is trivial.  11. The aortic valve is tricuspid Aortic valve regurgitation is mild by  color flow Doppler. Mild aortic valve sclerosis without stenosis.  12. The pulmonic valve was thickened. Pulmonic valve regurgitation is not  visualized by color flow Doppler.  13. Normal pulmonary artery systolic pressure.  14. The inferior vena cava is normal in size with greater than 50%  respiratory variability, suggesting right atrial pressure of 3 mmHg.   Assessment and Plan:  1.  CAD, mild and nonobstructive at cardiac catheterization in 2004 in the setting of suspected plaque rupture/aborted inferior infarct.  He has been managed medically and reports  no recurring angina symptoms.  Myoview in 2017 was low risk.  ECG reviewed.  Continue aspirin, Norvasc, Lopressor, Zocor, and as needed nitroglycerin.  2.  Bileaflet mitral valve prolapse with moderate to severe mitral regurgitation by echocardiogram last September.  Follow-up study will be obtained for surveillance.  He does not describe any definite change in stamina or worsening shortness of breath beyond NYHA class II.  3.  Mixed hyperlipidemia, on Zocor.  Last LDL 42.  Medication Adjustments/Labs and Tests Ordered: Current medicines are reviewed at length with the patient today.  Concerns regarding medicines are outlined above.   Tests Ordered: Orders Placed This Encounter  Procedures  . EKG 12-Lead  . ECHOCARDIOGRAM COMPLETE    Medication Changes: No orders of the defined types were placed in this encounter.   Disposition:  Follow up 6 months in the [place] office.  Signed, Satira Sark, MD, [facility] 11/30/2019 11:32 AM    [facility] [place], [place], [place] [address] Phone: [phone]; Fax: [phone]

## 2019-12-09 DIAGNOSIS — Z6826 Body mass index (BMI) 26.0-26.9, adult: Secondary | ICD-10-CM | POA: Diagnosis not present

## 2019-12-09 DIAGNOSIS — N183 Chronic kidney disease, stage 3 unspecified: Secondary | ICD-10-CM | POA: Diagnosis not present

## 2019-12-09 DIAGNOSIS — M150 Primary generalized (osteo)arthritis: Secondary | ICD-10-CM | POA: Diagnosis not present

## 2019-12-09 DIAGNOSIS — L4050 Arthropathic psoriasis, unspecified: Secondary | ICD-10-CM | POA: Diagnosis not present

## 2019-12-09 DIAGNOSIS — E663 Overweight: Secondary | ICD-10-CM | POA: Diagnosis not present

## 2019-12-15 DIAGNOSIS — L4050 Arthropathic psoriasis, unspecified: Secondary | ICD-10-CM | POA: Diagnosis not present

## 2019-12-24 ENCOUNTER — Other Ambulatory Visit: Payer: Self-pay

## 2019-12-24 ENCOUNTER — Ambulatory Visit (INDEPENDENT_AMBULATORY_CARE_PROVIDER_SITE_OTHER): Payer: Medicare PPO

## 2019-12-24 DIAGNOSIS — Z0000 Encounter for general adult medical examination without abnormal findings: Secondary | ICD-10-CM

## 2019-12-24 NOTE — Progress Notes (Signed)
Virtual Visit via Telephone Note  I connected with  Chase Cord. on 12/24/19 at  8:00 AM EDT by telephone and verified that I am speaking with the correct person using two identifiers.  Medicare Annual Wellness visit completed telephonically due to Covid-19 pandemic.   Persons participating in this call: This Health Coach and this patient. Location: Patient: Home Provider: Office   I discussed the limitations, risks, security and privacy concerns of performing an evaluation and management service by telephone and the availability of in person appointments. The patient expressed understanding and agreed to proceed.  Unable to perform video visit due to video visit attempted and failed and/or patient does not have video capability.   Some vital signs may be absent or patient reported.   Willette Brace, LPN    Subjective:   Chase Anderson. is a 74 y.o. male who presents for Medicare Annual/Subsequent preventive examination.  Review of Systems     Cardiac Risk Factors include: diabetes mellitus;dyslipidemia;hypertension;male gender     Objective:    There were no vitals filed for this visit. There is no height or weight on file to calculate BMI.  Advanced Directives 12/24/2019 11/23/2018 11/20/2017 10/30/2016 08/03/2015  Does Patient Have a Medical Advance Directive? No No No No No  Would patient like information on creating a medical advance directive? No - Patient declined Yes (MAU/Ambulatory/Procedural Areas - Information given) - - No - patient declined information    Current Medications (verified) Outpatient Encounter Medications as of 12/24/2019  Medication Sig   amLODipine (NORVASC) 10 MG tablet TAKE 1 TABLET BY MOUTH EVERY DAY   aspirin EC 81 MG tablet Take 81 mg by mouth daily.   buPROPion (WELLBUTRIN SR) 150 MG 12 hr tablet TAKE 1 TABLET BY MOUTH TWICE DAILY   clindamycin (CLEOCIN) 300 MG capsule Take 300 mg by mouth as needed.    famotidine (PEPCID)  20 MG tablet Take 1 tablet (20 mg total) by mouth 2 (two) times daily as needed for heartburn or indigestion.   fluconazole (DIFLUCAN) 100 MG tablet Take 100 mg by mouth daily as needed.    fluocinonide ointment (LIDEX) 0.05 % APPLY TOPICALLY ONCE DAILY AS NEEDED   glimepiride (AMARYL) 4 MG tablet TAKE 1 TABLET BY MOUTH DAILY BEFORE BREAKFAST   glucose blood (ACCU-CHEK AVIVA PLUS) test strip    glucose blood (ACCU-CHEK AVIVA PLUS) test strip USE TO TEST BLOOD SUGAR ONCE DAILY   InFLIXimab (REMICADE IV) Inject into the vein every 8 (eight) weeks.    JANUVIA 50 MG tablet TAKE 1 TABLET BY MOUTH DAILY   metFORMIN (GLUCOPHAGE-XR) 500 MG 24 hr tablet TAKE 2 TABLETS BY MOUTH DAILY WITH BREAKFAST   metoprolol tartrate (LOPRESSOR) 25 MG tablet Take 1 tablet (25 mg total) by mouth 2 (two) times daily.   nitroGLYCERIN (NITROSTAT) 0.4 MG SL tablet Place 0.4 mg under the tongue every 5 (five) minutes as needed for chest pain. Up to three time and then call the doctor   simvastatin (ZOCOR) 20 MG tablet Take 1 tablet (20 mg total) by mouth at bedtime.   sulfamethoxazole-trimethoprim (BACTRIM DS,SEPTRA DS) 800-160 MG tablet Take 1 tablet by mouth every Monday, Wednesday, and Friday.   tadalafil (CIALIS) 20 MG tablet Take 1 tablet (20 mg total) by mouth daily as needed for erectile dysfunction (no nitroglycerin within 72 hours).   traMADol (ULTRAM) 50 MG tablet TAKE 1 TABLET BY MOUTH TWICE DAILY AS NEEDED   valACYclovir (VALTREX) 500 MG tablet  Take 1 tablet (500 mg total) by mouth as needed. (Patient taking differently: Take 500 mg by mouth 3 (three) times daily. )   No facility-administered encounter medications on file as of 12/24/2019.    Allergies (verified) Codeine, Lisinopril, Penicillins, and Pristiq [desvenlafaxine succinate er]   History: Past Medical History:  Diagnosis Date   Allergic rhinitis    Anxiety    Atopic dermatitis    CAD (coronary artery disease)    Mild at  cardiac catheterization 2004   CKD (chronic kidney disease) stage 3, GFR 30-59 ml/min (HCC)    Collagen vascular disease (HCC)    Erectile dysfunction    Essential hypertension    GERD (gastroesophageal reflux disease)    Hyperlipidemia    Myocardial infarction ([place]) 2004   Suspected plaque rupture/aborted inferior infarct 2004, nonobstructive CAD managed medically   Osteoarthritis    Type 2 diabetes mellitus ([place])    Past Surgical History:  Procedure Laterality Date   COLONOSCOPY     ESOPHAGOGASTRODUODENOSCOPY  04/23/2005   KNEE ARTHROSCOPY     right   POLYPECTOMY  2008   TA x 4    TONSILLECTOMY     TOOTH EXTRACTION     x 4 removed    UPPER GASTROINTESTINAL ENDOSCOPY     Family History  Problem Relation Age of Onset   Dementia Mother    Other Mother        abdominal issues led to death- apparently inoperable growth-  likely cancer   COPD Father        led to death   Colon polyps Father    Diabetes Brother    Colon cancer Neg Hx    Esophageal cancer Neg Hx    Rectal cancer Neg Hx    Stomach cancer Neg Hx    Social History   Socioeconomic History   Marital status: Married    Spouse name: Not on file   Number of children: Not on file   Years of education: Not on file   Highest education level: Not on file  Occupational History   Not on file  Tobacco Use   Smoking status: Former Smoker    Packs/day: 2.00    Years: 30.00    Pack years: 60.00    Types: Cigarettes    Quit date: 12/09/2001    Years since quitting: 18.0   Smokeless tobacco: Former Systems developer    Types: Chew    Quit date: 03/11/2002  Vaping Use   Vaping Use: Never used  Substance and Sexual Activity   Alcohol use: No    Alcohol/week: 0.0 standard drinks   Drug use: No   Sexual activity: Not on file  Other Topics Concern   Not on file  Social History Narrative   Married. 2 children. 2 stepchildren. 7 grandkids including step   Take care of daughter of law Wei Popl[place]i- son left her with 59 year old.       Retired after  15 years [employer]      Hobbies: Air cabin crew, gardening   Social Determinants of Radio broadcast assistant Strain: Low Risk    Difficulty of Paying Living Expenses: Not hard at all  Food Insecurity: No Food Insecurity   Worried About Charity fundraiser in the Last Year: Never true   Arboriculturist in the Last Year: Never true  Transportation Needs: No Transportation Needs   Lack of Transportation (Medical): No   Lack of Transportation (Non-Medical): No  Physical Activity: Sufficiently Active   Days of Exercise per Week: 6 days   Minutes of Exercise per Session: 30 min  Stress: No Stress Concern Present   Feeling of Stress : Not at all  Social Connections: Moderately Integrated   Frequency of Communication with Friends and Family: More than three times a week   Frequency of Social Gatherings with Friends and Family: More than three times a week   Attends Religious Services: More than 4 times per year   Active Member of [employer] or Organizations: No   Attends Music therapist: Never   Marital Status: Married    Tobacco Counseling Counseling given: Not Answered   Clinical Intake:  Pre-visit preparation completed: Yes  Pain : No/denies pain     BMI - recorded: 25.95 Nutritional Status: BMI 25 -29 Overweight Nutritional Risks: None Diabetes: Yes CBG done?: Yes (122) Did pt. bring in CBG monitor from home?: No  How often do you need to have someone help you when you read instructions, pamphlets, or other written materials from your doctor or pharmacy?: 1 - Never  Diabetic?yes  Interpreter Needed?: No  Information entered by :: Charlott Rakes, LPN   Activities of Daily Living In your present state of health, do you have any difficulty performing the following activities: 12/24/2019 02/15/2019  Hearing? Y N  Comment stated mild loss -  Vision? N N  Difficulty concentrating or making  decisions? N N  Walking or climbing stairs? N N  Dressing or bathing? N N  Doing errands, shopping? N N  Preparing Food and eating ? N -  Using the Toilet? N -  In the past six months, have you accidently leaked urine? N -  Do you have problems with loss of bowel control? N -  Managing your Medications? N -  Managing your Finances? N -  Housekeeping or managing your Housekeeping? N -  Some recent data might be hidden    Patient Care Team: Marin Olp, MD as PCP - General (Family Medicine) Satira Sark, MD as PCP - Cardiology (Cardiology) Unice Bailey, MD as Consulting Physician (Rheumatology) Alfonzo Beers, MD as Consulting Physician (Infectious Diseases) Hennie Duos, MD as Consulting Physician (Rheumatology) Madelin Headings, DO as Consulting Physician (Optometry)  Indicate any recent Medical Services you may have received from other than [facility] providers in the past year (date may be approximate).     Assessment:   This is a routine wellness examination for Chase Anderson.  Hearing/Vision screen  Hearing Screening   125Hz  250Hz  500Hz  1000Hz  2000Hz  3000Hz  4000Hz  6000Hz  8000Hz   Right ear:           Left ear:           Comments: Mild loss of hearing   Vision Screening Comments: Pt follows Up with Dr Jorja Loa in [place] for annual eye exams  Dietary issues and exercise activities discussed: Current Exercise Habits: Home exercise routine, Type of exercise: walking, Time (Minutes): 30, Frequency (Times/Week): 6, Weekly Exercise (Minutes/Week): 180  Goals     Exercise 150 minutes per week (moderate activity)     Will continue to exercise x 4 days a week   Patient recently stopped walking after death of dog- plans to restart      Patient Stated     To continue walking       Depression Screen PHQ 2/9 Scores 12/24/2019 08/24/2019 02/15/2019 11/23/2018 08/26/2018 02/09/2018 11/20/2017  PHQ - 2 Score 0 0 1 0 0  3 0  PHQ- 9 Score - 0 7 - 1 5 -    Fall  Risk Fall Risk  12/24/2019 02/15/2019 11/23/2018 08/26/2018 11/20/2017  Falls in the past year? 0 0 0 0 No  Number falls in past yr: 0 0 0 - -  Injury with Fall? 0 0 0 - -  Risk for fall due to : Impaired vision - - - -  Follow up Falls prevention discussed - Education provided;Falls evaluation completed - -    Any stairs in or around the home? Yes  If so, are there any without handrails? No  Home free of loose throw rugs in walkways, pet beds, electrical cords, etc? Yes  Adequate lighting in your home to reduce risk of falls? Yes   ASSISTIVE DEVICES UTILIZED TO PREVENT FALLS:  Life alert? No  Use of a cane, walker or w/c? No  Grab bars in the bathroom? No  Shower chair or bench in shower? No  Elevated toilet seat or a handicapped toilet? Yes  TIMED UP AND GO:  Was the test performed? No .      Cognitive Function: MMSE - Mini Mental State Exam 11/20/2017 10/30/2016  Not completed: (No Data) (No Data)     6CIT Screen 12/24/2019 11/23/2018  What Year? 0 points 0 points  What month? 0 points 0 points  What time? - 0 points  Count back from 20 0 points 0 points  Months in reverse 0 points 0 points  Repeat phrase 0 points 0 points  Total Score - 0    Immunizations Immunization History  Administered Date(s) Administered   Fluad Quad(high Dose 65+) 11/23/2018   Influenza Split 01/23/2012, 11/19/2012   Influenza Whole 11/26/2007, 12/14/2009   Influenza, High Dose Seasonal PF 02/01/2014, 02/06/2015, 02/07/2016, 12/06/2016   Influenza, Seasonal, Injecte, Preservative Fre 01/05/2009   Influenza,inj,Quad PF,6+ Mos 11/19/2012   Influenza-Unspecified 01/05/2009, 11/27/2017, 11/23/2018   Moderna SARS-COVID-2 Vaccination 04/07/2019, 05/05/2019   Pneumococcal Conjugate-13 06/02/2013   Pneumococcal Polysaccharide-23 03/11/2004, 06/29/2009   Td 03/11/2002   Tetanus 03/10/2013   Zoster Recombinat (Shingrix) 06/10/2017, 10/01/2017    TDAP status: Up to date Flu Vaccine  status: Declined, Education has been provided regarding the importance of this vaccine but patient still declined. Advised may receive this vaccine at local pharmacy or Health Dept. Aware to provide a copy of the vaccination record if obtained from local pharmacy or Health Dept. Verbalized acceptance and understanding. Pt states he will get Flu vaccine later this month Pneumococcal vaccine status: Up to date Covid-19 vaccine status: Completed vaccines  Qualifies for Shingles Vaccine? Yes   Zostavax completed Yes   Shingrix Completed?: Yes  Screening Tests Health Maintenance  Topic Date Due   INFLUENZA VACCINE  10/10/2019   URINE MICROALBUMIN  02/15/2020   FOOT EXAM  02/15/2020   HEMOGLOBIN A1C  02/23/2020   OPHTHALMOLOGY EXAM  08/23/2020   COLONOSCOPY  09/14/2020   TETANUS/TDAP  03/11/2023   COVID-19 Vaccine  Completed   Hepatitis C Screening  Completed   PNA vac Low Risk Adult  Addressed    Health Maintenance  Health Maintenance Due  Topic Date Due   INFLUENZA VACCINE  10/10/2019   URINE MICROALBUMIN  02/15/2020    Colorectal cancer screening: Completed 09/15/19. Repeat every 1 years    Additional Screening:  Hepatitis C Screening:  Completed 01/2916  Vision Screening: Recommended annual ophthalmology exams for early detection of glaucoma and other disorders of the eye. Is the patient up to date  with their annual eye exam?  Yes  Who is the provider or what is the name of the office in which the patient attends annual eye exams? Dr Jorja Loa   Dental Screening: Recommended annual dental exams for proper oral hygiene  Community Resource Referral / Chronic Care Management: CRR required this visit?  No   CCM required this visit?  No      Plan:     I have personally reviewed and noted the following in the patients chart:    Medical and social history  Use of alcohol, tobacco or illicit drugs   Current medications and supplements  Functional ability  and status  Nutritional status  Physical activity  Advanced directives  List of other physicians  Hospitalizations, surgeries, and ER visits in previous 12 months  Vitals  Screenings to include cognitive, depression, and falls  Referrals and appointments  In addition, I have reviewed and discussed with patient certain preventive protocols, quality metrics, and best practice recommendations. A written personalized care plan for preventive services as well as general preventive health recommendations were provided to patient.     Willette Brace, LPN   [id number]   Nurse Notes: None

## 2019-12-24 NOTE — Patient Instructions (Addendum)
Chase Anderson , Thank you for taking time to come for your Medicare Wellness Visit. I appreciate your ongoing commitment to your health goals. Please review the following plan we discussed and let me know if I can assist you in the future.   Screening recommendations/referrals: Colonoscopy: Done 09/15/19 Recommended yearly ophthalmology/optometry visit for glaucoma screening and checkup Recommended yearly dental visit for hygiene and checkup  Vaccinations: Influenza vaccine: Pt stated he will receive later this month Pneumococcal vaccine: Up to date Tdap vaccine: Done 03/10/13 Shingles vaccine: Completed 4/2 , 10/01/17   Covid-19: Completed 1/27, 05/05/19  Advanced directives: Advance directive discussed with you today. Even though you declined this today please call our office should you change your mind and we can give you the proper paperwork for you to fill out.  Conditions/risks identified: keep walking  Next appointment: Follow up in one year for your annual wellness visit.   Preventive Care 23 Years and Older, Male Preventive care refers to lifestyle choices and visits with your health care provider that can promote health and wellness. What does preventive care include?  A yearly physical exam. This is also called an annual well check.  Dental exams once or twice a year.  Routine eye exams. Ask your health care provider how often you should have your eyes checked.  Personal lifestyle choices, including:  Daily care of your teeth and gums.  Regular physical activity.  Eating a healthy diet.  Avoiding tobacco and drug use.  Limiting alcohol use.  Practicing safe sex.  Taking low doses of aspirin every day.  Taking vitamin and mineral supplements as recommended by your health care provider. What happens during an annual well check? The services and screenings done by your health care provider during your annual well check will depend on your age, overall health,  lifestyle risk factors, and family history of disease. Counseling  Your health care provider may ask you questions about your:  Alcohol use.  Tobacco use.  Drug use.  Emotional well-being.  Home and relationship well-being.  Sexual activity.  Eating habits.  History of falls.  Memory and ability to understand (cognition).  Work and work Statistician. Screening  You may have the following tests or measurements:  Height, weight, and BMI.  Blood pressure.  Lipid and cholesterol levels. These may be checked every 5 years, or more frequently if you are over 59 years old.  Skin check.  Lung cancer screening. You may have this screening every year starting at age 35 if you have a 30-pack-year history of smoking and currently smoke or have quit within the past 15 years.  Fecal occult blood test (FOBT) of the stool. You may have this test every year starting at age 64.  Flexible sigmoidoscopy or colonoscopy. You may have a sigmoidoscopy every 5 years or a colonoscopy every 10 years starting at age 32.  Prostate cancer screening. Recommendations will vary depending on your family history and other risks.  Hepatitis C blood test.  Hepatitis B blood test.  Sexually transmitted disease (STD) testing.  Diabetes screening. This is done by checking your blood sugar (glucose) after you have not eaten for a while (fasting). You may have this done every 1-3 years.  Abdominal aortic aneurysm (AAA) screening. You may need this if you are a current or former smoker.  Osteoporosis. You may be screened starting at age 41 if you are at high risk. Talk with your health care provider about your test results, treatment options, and if  necessary, the need for more tests. Vaccines  Your health care provider may recommend certain vaccines, such as:  Influenza vaccine. This is recommended every year.  Tetanus, diphtheria, and acellular pertussis (Tdap, Td) vaccine. You may need a Td booster  every 10 years.  Zoster vaccine. You may need this after age 75.  Pneumococcal 13-valent conjugate (PCV13) vaccine. One dose is recommended after age 33.  Pneumococcal polysaccharide (PPSV23) vaccine. One dose is recommended after age 12. Talk to your health care provider about which screenings and vaccines you need and how often you need them. This information is not intended to replace advice given to you by your health care provider. Make sure you discuss any questions you have with your health care provider. Document Released: 03/24/2015 Document Revised: 11/15/2015 Document Reviewed: 12/27/2014 Elsevier Interactive Patient Education  2017 [place] Prevention in the Home Falls can cause injuries. They can happen to people of all ages. There are many things you can do to make your home safe and to help prevent falls. What can I do on the outside of my home?  Regularly fix the edges of walkways and driveways and fix any cracks.  Remove anything that might make you trip as you walk through a door, such as a raised step or threshold.  Trim any bushes or trees on the path to your home.  Use bright outdoor lighting.  Clear any walking paths of anything that might make someone trip, such as rocks or tools.  Regularly check to see if handrails are loose or broken. Make sure that both sides of any steps have handrails.  Any raised decks and porches should have guardrails on the edges.  Have any leaves, snow, or ice cleared regularly.  Use sand or salt on walking paths during winter.  Clean up any spills in your garage right away. This includes oil or grease spills. What can I do in the bathroom?  Use night lights.  Install grab bars by the toilet and in the tub and shower. Do not use towel bars as grab bars.  Use non-skid mats or decals in the tub or shower.  If you need to sit down in the shower, use a plastic, non-slip stool.  Keep the floor dry. Clean up any  water that spills on the floor as soon as it happens.  Remove soap buildup in the tub or shower regularly.  Attach bath mats securely with double-sided non-slip rug tape.  Do not have throw rugs and other things on the floor that can make you trip. What can I do in the bedroom?  Use night lights.  Make sure that you have a light by your bed that is easy to reach.  Do not use any sheets or blankets that are too big for your bed. They should not hang down onto the floor.  Have a firm chair that has side arms. You can use this for support while you get dressed.  Do not have throw rugs and other things on the floor that can make you trip. What can I do in the kitchen?  Clean up any spills right away.  Avoid walking on wet floors.  Keep items that you use a lot in easy-to-reach places.  If you need to reach something above you, use a strong step stool that has a grab bar.  Keep electrical cords out of the way.  Do not use floor polish or wax that makes floors slippery. If you must  use wax, use non-skid floor wax.  Do not have throw rugs and other things on the floor that can make you trip. What can I do with my stairs?  Do not leave any items on the stairs.  Make sure that there are handrails on both sides of the stairs and use them. Fix handrails that are broken or loose. Make sure that handrails are as long as the stairways.  Check any carpeting to make sure that it is firmly attached to the stairs. Fix any carpet that is loose or worn.  Avoid having throw rugs at the top or bottom of the stairs. If you do have throw rugs, attach them to the floor with carpet tape.  Make sure that you have a light switch at the top of the stairs and the bottom of the stairs. If you do not have them, ask someone to add them for you. What else can I do to help prevent falls?  Wear shoes that:  Do not have high heels.  Have rubber bottoms.  Are comfortable and fit you well.  Are closed  at the toe. Do not wear sandals.  If you use a stepladder:  Make sure that it is fully opened. Do not climb a closed stepladder.  Make sure that both sides of the stepladder are locked into place.  Ask someone to hold it for you, if possible.  Clearly mark and make sure that you can see:  Any grab bars or handrails.  First and last steps.  Where the edge of each step is.  Use tools that help you move around (mobility aids) if they are needed. These include:  Canes.  Walkers.  Scooters.  Crutches.  Turn on the lights when you go into a dark area. Replace any light bulbs as soon as they burn out.  Set up your furniture so you have a clear path. Avoid moving your furniture around.  If any of your floors are uneven, fix them.  If there are any pets around you, be aware of where they are.  Review your medicines with your doctor. Some medicines can make you feel dizzy. This can increase your chance of falling. Ask your doctor what other things that you can do to help prevent falls. This information is not intended to replace advice given to you by your health care provider. Make sure you discuss any questions you have with your health care provider. Document Released: 12/22/2008 Document Revised: 08/03/2015 Document Reviewed: 04/01/2014 Elsevier Interactive Patient Education  2017 [employer].

## 2019-12-30 ENCOUNTER — Other Ambulatory Visit: Payer: Self-pay

## 2019-12-30 ENCOUNTER — Ambulatory Visit (INDEPENDENT_AMBULATORY_CARE_PROVIDER_SITE_OTHER): Payer: Medicare PPO

## 2019-12-30 DIAGNOSIS — I340 Nonrheumatic mitral (valve) insufficiency: Secondary | ICD-10-CM | POA: Diagnosis not present

## 2019-12-30 DIAGNOSIS — Z23 Encounter for immunization: Secondary | ICD-10-CM | POA: Diagnosis not present

## 2019-12-30 LAB — ECHOCARDIOGRAM COMPLETE
Area-P 1/2: 4.41 cm2
Calc EF: 60.7 %
MV M vel: 5.11 m/s
MV Peak grad: 104.3 mmHg
P 1/2 time: 808 msec
S' Lateral: 3.59 cm
Single Plane A2C EF: 56.4 %
Single Plane A4C EF: 60.7 %

## 2020-01-03 ENCOUNTER — Telehealth: Payer: Self-pay | Admitting: *Deleted

## 2020-01-03 NOTE — Telephone Encounter (Signed)
Pt voiced understanding

## 2020-01-03 NOTE — Telephone Encounter (Signed)
-----   Message from Satira Sark, MD sent at 12/30/2019 12:35 PM EDT ----- Results reviewed.  No significant change in degree of mitral regurgitation, still moderate to severe range in the setting of mitral valve prolapse.  Continue with current follow-up plan.

## 2020-02-01 ENCOUNTER — Other Ambulatory Visit: Payer: Self-pay | Admitting: Family Medicine

## 2020-02-05 ENCOUNTER — Other Ambulatory Visit: Payer: Self-pay | Admitting: Family Medicine

## 2020-02-09 DIAGNOSIS — L4050 Arthropathic psoriasis, unspecified: Secondary | ICD-10-CM | POA: Diagnosis not present

## 2020-02-16 ENCOUNTER — Other Ambulatory Visit: Payer: Self-pay | Admitting: Family Medicine

## 2020-02-16 DIAGNOSIS — E7800 Pure hypercholesterolemia, unspecified: Secondary | ICD-10-CM

## 2020-02-23 NOTE — Progress Notes (Addendum)
Phone: [phone]   Subjective:  Patient presents today for their annual physical. Chief complaint-noted.   See problem oriented charting- ROS- full  review of systems was completed and negative  except for: a little down and doesn't feel like remicade is helping as much as it should, palpitations  The following were reviewed and entered/updated in epic: Past Medical History:  Diagnosis Date  . Allergic rhinitis   . Anxiety   . Atopic dermatitis   . CAD (coronary artery disease)    Mild at cardiac catheterization 2004  . CKD (chronic kidney disease) stage 3, GFR 30-59 ml/min (HCC)   . Collagen vascular disease ([place])   . Erectile dysfunction   . Essential hypertension   . GERD (gastroesophageal reflux disease)   . Hyperlipidemia   . Myocardial infarction ([place]) 2004   Suspected plaque rupture/aborted inferior infarct 2004, nonobstructive CAD managed medically  . Osteoarthritis   . Type 2 diabetes mellitus [facility])    Patient Active Problem List   Diagnosis Date Noted  . Thrombocytosis 04/03/2016    Priority: High  . Depression 03/27/2015    Priority: High  . Psoriatic arthritis ([place]) 01/25/2013    Priority: High  . Mitral valve prolapse 12/25/2010    Priority: High  . Leukocytopenia 11/24/2006    Priority: High  . Controlled type 2 diabetes mellitus with ophthalmic complication ([place]) 45/80/9983    Priority: High  . CAD (coronary artery disease) with history MI 2004- no intervention 10/06/2006    Priority: High  . Mitral valve regurgitation 02/15/2019    Priority: Medium  . Dyshidrotic eczema 02/07/2016    Priority: Medium  . CKD (chronic kidney disease), stage III ([place]) 09/15/2015    Priority: Medium  . History of BPH 12/14/2009    Priority: Medium  . Hyperlipidemia associated with type 2 diabetes mellitus ([place]) 10/06/2006    Priority: Medium  . Hypertension associated with diabetes ([place]) 10/06/2006    Priority: Medium  . GERD (gastroesophageal reflux disease)  08/24/2019    Priority: Low  . Diabetic retinopathy ([place]) 02/20/2017    Priority: Low  . ERECTILE DYSFUNCTION 11/26/2007    Priority: Low  . Candidiasis 11/25/2012  . Immunodeficiency disorder with predominant T-cell defect ([place]) 11/19/2012   Past Surgical History:  Procedure Laterality Date  . COLONOSCOPY    . ESOPHAGOGASTRODUODENOSCOPY  04/23/2005  . KNEE ARTHROSCOPY     right  . POLYPECTOMY  2008   TA x 4   . TONSILLECTOMY    . TOOTH EXTRACTION     x 4 removed   . UPPER GASTROINTESTINAL ENDOSCOPY      Family History  Problem Relation Age of Onset  . Dementia Mother   . Other Mother        abdominal issues led to death- apparently inoperable growth-  likely cancer  . COPD Father        led to death  . Colon polyps Father   . Diabetes Brother   . Colon cancer Neg Hx   . Esophageal cancer Neg Hx   . Rectal cancer Neg Hx   . Stomach cancer Neg Hx     Medications- reviewed and updated Current Outpatient Medications  Medication Sig Dispense Refill  . ACCU-CHEK AVIVA PLUS test strip USE TO TEST BLOOD SUGAR EVERY DAY 100 strip 1  . amLODipine (NORVASC) 10 MG tablet TAKE 1 TABLET BY MOUTH EVERY DAY 100 tablet 3  . aspirin EC 81 MG tablet Take 81 mg by mouth daily.    Marland Kitchen  buPROPion (WELLBUTRIN SR) 150 MG 12 hr tablet TAKE 1 TABLET BY MOUTH TWICE DAILY 180 tablet 3  . clindamycin (CLEOCIN) 300 MG capsule Take 300 mg by mouth as needed.     . famotidine (PEPCID) 20 MG tablet Take 1 tablet (20 mg total) by mouth 2 (two) times daily as needed for heartburn or indigestion. 60 tablet 11  . fluconazole (DIFLUCAN) 100 MG tablet Take 100 mg by mouth daily as needed.     . fluocinonide ointment (LIDEX) 0.05 % APPLY TOPICALLY ONCE DAILY AS NEEDED 60 g 3  . glimepiride (AMARYL) 4 MG tablet TAKE 1 TABLET BY MOUTH DAILY BEFORE BREAKFAST 30 tablet 5  . glucose blood (ACCU-CHEK AVIVA PLUS) test strip     . glucose blood (ACCU-CHEK AVIVA PLUS) test strip USE TO TEST BLOOD SUGAR ONCE DAILY     . InFLIXimab (REMICADE IV) Inject into the vein every 8 (eight) weeks.     Marland Kitchen JANUVIA 50 MG tablet TAKE 1 TABLET BY MOUTH DAILY 30 tablet 5  . metFORMIN (GLUCOPHAGE-XR) 500 MG 24 hr tablet TAKE 2 TABLETS BY MOUTH DAILY WITH BREAKFAST 180 tablet 3  . metoprolol tartrate (LOPRESSOR) 25 MG tablet Take 1 tablet (25 mg total) by mouth 2 (two) times daily. 180 tablet 1  . nitroGLYCERIN (NITROSTAT) 0.4 MG SL tablet Place 0.4 mg under the tongue every 5 (five) minutes as needed for chest pain. Up to three time and then call the doctor    . simvastatin (ZOCOR) 20 MG tablet TAKE 1 TABLET(20 MG) BY MOUTH AT BEDTIME 90 tablet 1  . sulfamethoxazole-trimethoprim (BACTRIM DS,SEPTRA DS) 800-160 MG tablet Take 1 tablet by mouth every Monday, Wednesday, and Friday.  5  . tadalafil (CIALIS) 20 MG tablet Take 1 tablet (20 mg total) by mouth daily as needed for erectile dysfunction (no nitroglycerin within 72 hours). 10 tablet 5  . traMADol (ULTRAM) 50 MG tablet TAKE 1 TABLET BY MOUTH TWICE DAILY AS NEEDED 60 tablet 5  . valACYclovir (VALTREX) 500 MG tablet Take 1 tablet (500 mg total) by mouth as needed. (Patient taking differently: Take 500 mg by mouth 3 (three) times daily.) 60 tablet 3   No current facility-administered medications for this visit.    Allergies-reviewed and updated Allergies  Allergen Reactions  . Codeine     REACTION: itching and rash  . Lisinopril   . Penicillins     REACTION: as a child  . Pristiq [Desvenlafaxine Succinate Er]     "funny feeling"    Social History   Social History Narrative   Married. 2 children. 2 stepchildren. 7 grandkids including step   Take care of daughter of law Breylan Lefevers- son left her with 3 year old.       Retired after  78 years [employer]      Hobbies: golfer, gardening   Objective  Objective:  BP 125/65   Pulse 68   Temp (!) 97.2 F (36.2 C) (Temporal)   Ht 5\' 11"  (1.803 m)   Wt 185 lb 3.2 oz (84 kg)   SpO2 98%   BMI 25.83 kg/m  Gen: NAD,  resting comfortably HEENT: Mucous membranes are moist. Oropharynx normal Neck: no thyromegaly CV: RRR stable systolic murmur Lungs: CTAB no crackles, wheeze, rhonchi Abdomen: soft/nontender/nondistended/normal bowel sounds. No rebound or guarding. Diastasis reccti noted Ext: no edema Skin: warm, dry Neuro: grossly normal, moves all extremities, PERRLA  Diabetic Foot Exam - Simple   Simple Foot Form Diabetic Foot exam  was performed with the following findings: Yes 02/25/2020  9:38 AM  Visual Inspection No deformities, no ulcerations, no other skin breakdown bilaterally: Yes Sensation Testing Intact to touch and monofilament testing bilaterally: Yes Pulse Check Posterior Tibialis and Dorsalis pulse intact bilaterally: Yes Comments Scratch on top of left foot- states had itched and cut open scratching- healing now.       Assessment and Plan  74 y.o. male presenting for annual physical.  Health Maintenance counseling: 1. Anticipatory guidance: Patient counseled regarding regular dental exams -q6 months, eye exams -yearly with known retinopathy,  avoiding smoking and second hand smoke , limiting alcohol to 2 beverages per day-does not drink at all.   2. Risk factor reduction:  Advised patient of need for regular exercise and diet rich and fruits and vegetables to reduce risk of heart attack and stroke. Exercise-walking 2 miles daily with his border collie. Diet-slightly looser with holidays- feels he could tighten diet some for his diabetes.  Wt Readings from Last 3 Encounters:  02/25/20 185 lb 3.2 oz (84 kg)  11/30/19 186 lb (84.4 kg)  09/15/19 188 lb (85.3 kg)  3. Immunizations/screenings/ancillary studies-fully up-to-date  Immunization History  Administered Date(s) Administered  . Fluad Quad(high Dose 65+) 11/23/2018  . Influenza Split 01/23/2012, 11/19/2012  . Influenza Whole 11/26/2007, 12/14/2009  . Influenza, High Dose Seasonal PF 02/01/2014, 02/06/2015, 02/07/2016,  12/06/2016  . Influenza, Seasonal, Injecte, Preservative Fre 01/05/2009  . Influenza,inj,Quad PF,6+ Mos 11/19/2012, 12/30/2019  . Influenza-Unspecified 01/05/2009, 02/01/2014, 02/06/2015, 02/07/2016, 12/06/2016, 11/27/2017, 11/23/2018  . Moderna Sars-Covid-2 Vaccination 04/07/2019, 05/05/2019, 01/15/2020  . Pneumococcal Conjugate-13 06/02/2013  . Pneumococcal Polysaccharide-23 03/11/2004, 06/29/2009  . Td 03/11/2002  . Tetanus 03/10/2013  . Zoster Recombinat (Shingrix) 06/10/2017, 10/01/2017   4. Prostate cancer screening- past age based screening recommendations.  Nocturia twice a night stable for years. Lab Results  Component Value Date   PSA 0.67 02/07/2016   PSA 0.54 02/06/2015   PSA 0.56 02/01/2014   5. Colon cancer screening -09/15/19  recently 12 adenomas- 1 year colonoscopy repeat 6. Skin cancer screening- no dermatologist- Dr. [place] Crane in the past. advised regular sunscreen use. Denies worrisome, changing, or new skin lesions.  7. Former smoker-60 pack years but quit in 2003-2005.  CT scan of abdomen without obvious AAA.  Will get urine with labs.  Quit over 15 years ago so will not qualify for lung cancer screening 8. STD screening - only active with wife so declines  Status of chronic or acute concerns   #Psoriatic arthritis-continues on Remicade gets labs every 16 weeks.  Follows closely with rheumatology.  I did prescribe his tramadol as needed in past- now through rheumatology. Recent flare up- encouraged him to follow up with rheumatology  #Thrombocytosis-chronically elevated platelets.  Has [facility]ology[facility] at [facility] within the last year.  Thought to be potentially related to psoriatic arthritis or Remicade.  We will trend CBC today.  #CAD-compliant with aspirin and statin.  Nitroglycerin on hand but does not have to use. No chest pain or shortness of breath for most part.  On metoprolol as well  #Chronic kidney disease stage III #hyperkalemia  S: GFR is typically in the  40srange -Patient knows to avoid NSAIDs (Take some recently and advised against) -Has not tolerated ACE inhibitor in the past.  Would consider ARB if needed if needed ut dont think potassium would tolerate A/P: hopefully stable- update cmp . Bactrim could contribute to high potassium but needs this- may need nephrology opinion if potassium high  #  Depression S: Medication: Bupropion 150mg  twice daily Depression screen [facility] 2/9 02/25/2020 12/24/2019 08/24/2019  Decreased Interest 0 0 0  Down, Depressed, Hopeless 0 0 0  PHQ - 2 Score 0 0 0  Altered sleeping 0 - 0  Tired, decreased energy 0 - 0  Change in appetite 0 - 0  Feeling bad or failure about yourself  0 - 0  Trouble concentrating 0 - 0  Moving slowly or fidgety/restless 0 - 0  Suicidal thoughts 0 - 0  PHQ-9 Score 0 - 0  Difficult doing work/chores Not difficult at all - Not difficult at all  Some recent data might be hidden  A/P: Reasonable control/intervention-continue current medication  # Diabetes S: Medication: Amaryl 4Mg , Januvia 50Mg , Metformin 500Mg  ER 2 tablets daily CBGs- usually 120s but this AM 160s - splurging some Lab Results  Component Value Date   HGBA1C 7.9 (H) 08/24/2019   HGBA1C 6.9 (H) 02/15/2019   HGBA1C 9.2 10/22/2018   A/P: Poor control last visit.  Patient thought he can make some substantial lifestyle changes and improve diet/exercise.  We will update A1c today to reassess.  Consider increasing medication if A1c above 8  # GERD S:Medication: Pepcid 20Mg  plan for up to twice daily at last visit A/P: very helpful= continue current meds   #hyperlipidemia S: Medication:Simvastatin 20 mg Lab Results  Component Value Date   CHOL 100 02/15/2019   HDL 31.10 (L) 02/15/2019   LDLCALC 42 02/15/2019   TRIG 135.0 02/15/2019   CHOLHDL 3 02/15/2019   A/P: hopefully stable- LDL goal under 70- update today   #hypertension S: medication: Amlodipine 10Mg , metoprolol 25 mg BID BP Readings from Last 3  Encounters:  02/25/20 125/65  11/30/19 132/70  09/15/19 129/65  A/P: Well-controlled-continue current medication  Recommended follow up: Return in about 6 months (around 08/25/2020) for follow up- or sooner if needed. if a1c is high could see each other sooner. Future Appointments  Date Time Provider [place]  05/30/2020  8:40 AM Satira Sark, MD [facility]  12/29/2020  8:00 AM [facility] CO[facility]   Lab/Order associations: fasting   ICD-10-CM   1. Preventative health care  Z00.00   2. Hypertension associated with diabetes ([place])  E11.59    I15.2   3. Gastroesophageal reflux disease without esophagitis  K21.9   4. Controlled type 2 diabetes mellitus with retinopathy, without long-term current use of insulin, macular edema presence unspecified, unspecified laterality, unspecified retinopathy severity ([place])  E11.319   5. Hyperlipidemia associated with type 2 diabetes mellitus (HCC)  E11.69    E78.5   6. Stage 3 chronic kidney disease, unspecified whether stage 3a or 3b CKD (HCC)  N18.30   7. Recurrent major depressive disorder, in full remission ([place])  F33.42   8. Hyperkalemia  E87.5     No orders of the defined types were placed in this encounter.   Return precautions advised.  Garret Reddish, MD

## 2020-02-23 NOTE — Patient Instructions (Addendum)
  Please stop by lab before you go If you have mychart- we will send your results within 3 business days of [place] receiving them.  If you do not have mychart- we will call you about results within 5 business days of [place] receiving them.  *please note we are currently using [facility] which has a longer processing time than [place] typically so labs may not come back as quickly as in the past *please also note that you will see labs on mychart as soon as they post. I will later go in and write notes on them- will say "notes from Dr. Yong Channel"  Health Maintenance Due  Topic Date Due  . OPHTHALMOLOGY EXAM Sign a release at check out 04/08/2019   Recommended follow up: Return in about 6 months (around 08/25/2020) for follow up- or sooner if needed. if a1c is high could see each other sooner.

## 2020-02-25 ENCOUNTER — Other Ambulatory Visit: Payer: Self-pay

## 2020-02-25 ENCOUNTER — Ambulatory Visit (INDEPENDENT_AMBULATORY_CARE_PROVIDER_SITE_OTHER): Payer: Medicare PPO | Admitting: Family Medicine

## 2020-02-25 ENCOUNTER — Encounter: Payer: Self-pay | Admitting: Family Medicine

## 2020-02-25 ENCOUNTER — Other Ambulatory Visit: Payer: Self-pay | Admitting: *Deleted

## 2020-02-25 VITALS — BP 125/65 | HR 68 | Temp 97.2°F | Ht 71.0 in | Wt 185.2 lb

## 2020-02-25 DIAGNOSIS — E1159 Type 2 diabetes mellitus with other circulatory complications: Secondary | ICD-10-CM | POA: Diagnosis not present

## 2020-02-25 DIAGNOSIS — I152 Hypertension secondary to endocrine disorders: Secondary | ICD-10-CM

## 2020-02-25 DIAGNOSIS — Z0000 Encounter for general adult medical examination without abnormal findings: Secondary | ICD-10-CM | POA: Diagnosis not present

## 2020-02-25 DIAGNOSIS — E785 Hyperlipidemia, unspecified: Secondary | ICD-10-CM

## 2020-02-25 DIAGNOSIS — E1169 Type 2 diabetes mellitus with other specified complication: Secondary | ICD-10-CM

## 2020-02-25 DIAGNOSIS — N183 Chronic kidney disease, stage 3 unspecified: Secondary | ICD-10-CM

## 2020-02-25 DIAGNOSIS — E11319 Type 2 diabetes mellitus with unspecified diabetic retinopathy without macular edema: Secondary | ICD-10-CM

## 2020-02-25 DIAGNOSIS — F3342 Major depressive disorder, recurrent, in full remission: Secondary | ICD-10-CM

## 2020-02-25 DIAGNOSIS — E875 Hyperkalemia: Secondary | ICD-10-CM

## 2020-02-25 DIAGNOSIS — K219 Gastro-esophageal reflux disease without esophagitis: Secondary | ICD-10-CM

## 2020-02-26 LAB — LIPID PANEL W/REFLEX DIRECT LDL
Cholesterol: 90 mg/dL (ref <200)
HDL: 32 mg/dL — ABNORMAL LOW (ref >=40)
LDL Cholesterol (Calc): 41 mg/dL (calc)
Non-HDL Cholesterol (Calc): 58 mg/dL (calc) (ref <130)
Total CHOL/HDL Ratio: 2.8 (calc) (ref <5.0)
Triglycerides: 90 mg/dL (ref <150)

## 2020-02-26 LAB — COMPLETE METABOLIC PANEL WITH GFR
AG Ratio: 2.2 (calc) (ref 1.0–2.5)
ALT: 18 U/L (ref 9–46)
AST: 16 U/L (ref 10–35)
Albumin: 4.4 g/dL (ref 3.6–5.1)
Alkaline phosphatase (APISO): 38 U/L (ref 35–144)
BUN/Creatinine Ratio: 15 (calc) (ref 6–22)
BUN: 26 mg/dL — ABNORMAL HIGH (ref 7–25)
CO2: 25 mmol/L (ref 20–32)
Calcium: 9.4 mg/dL (ref 8.6–10.3)
Chloride: 103 mmol/L (ref 98–110)
Creat: 1.76 mg/dL — ABNORMAL HIGH (ref 0.70–1.18)
GFR, Est African American: 43 mL/min/{1.73_m2} — ABNORMAL LOW (ref >=60)
GFR, Est Non African American: 37 mL/min/{1.73_m2} — ABNORMAL LOW (ref >=60)
Globulin: 2 g/dL (calc) (ref 1.9–3.7)
Glucose, Bld: 148 mg/dL — ABNORMAL HIGH (ref 65–99)
Potassium: 5.7 mmol/L — ABNORMAL HIGH (ref 3.5–5.3)
Sodium: 135 mmol/L (ref 135–146)
Total Bilirubin: 0.5 mg/dL (ref 0.2–1.2)
Total Protein: 6.4 g/dL (ref 6.1–8.1)

## 2020-02-26 LAB — MICROALBUMIN / CREATININE URINE RATIO
Creatinine, Urine: 115 mg/dL (ref 20–320)
Microalb Creat Ratio: 12 mcg/mg creat (ref <30)
Microalb, Ur: 1.4 mg/dL

## 2020-02-26 LAB — CBC WITH DIFFERENTIAL/PLATELET
Absolute Monocytes: 605 cells/uL (ref 200–950)
Basophils Absolute: 72 cells/uL (ref 0–200)
Basophils Relative: 1 %
Eosinophils Absolute: 223 cells/uL (ref 15–500)
Eosinophils Relative: 3.1 %
HCT: 45.4 % (ref 38.5–50.0)
Hemoglobin: 15.3 g/dL (ref 13.2–17.1)
Lymphs Abs: 799 cells/uL — ABNORMAL LOW (ref 850–3900)
MCH: 29.9 pg (ref 27.0–33.0)
MCHC: 33.7 g/dL (ref 32.0–36.0)
MCV: 88.8 fL (ref 80.0–100.0)
MPV: 9.6 fL (ref 7.5–12.5)
Monocytes Relative: 8.4 %
Neutro Abs: 5501 cells/uL (ref 1500–7800)
Neutrophils Relative %: 76.4 %
Platelets: 778 10*3/uL — ABNORMAL HIGH (ref 140–400)
RBC: 5.11 10*6/uL (ref 4.20–5.80)
RDW: 13.7 % (ref 11.0–15.0)
Total Lymphocyte: 11.1 %
WBC: 7.2 10*3/uL (ref 3.8–10.8)

## 2020-02-26 LAB — HEMOGLOBIN A1C
Hgb A1c MFr Bld: 6.7 % of total Hgb — ABNORMAL HIGH (ref <5.7)
Mean Plasma Glucose: 146 mg/dL
eAG (mmol/L): 8.1 mmol/L

## 2020-04-05 DIAGNOSIS — L4050 Arthropathic psoriasis, unspecified: Secondary | ICD-10-CM | POA: Diagnosis not present

## 2020-04-05 DIAGNOSIS — Z79899 Other long term (current) drug therapy: Secondary | ICD-10-CM | POA: Diagnosis not present

## 2020-04-05 LAB — CBC AND DIFFERENTIAL
HCT: 48 (ref 41–53)
Hemoglobin: 15.9 (ref 13.5–17.5)
Neutrophils Absolute: 5.1
Platelets: 817 — AB (ref 150–399)
WBC: 7.1

## 2020-04-05 LAB — COMPREHENSIVE METABOLIC PANEL
Albumin: 4.5 (ref 3.5–5.0)
Calcium: 9.5 (ref 8.7–10.7)
Globulin: 1.7

## 2020-04-05 LAB — BASIC METABOLIC PANEL
CO2: 16 (ref 13–22)
Chloride: 103 (ref 99–108)
Creatinine: 12 — AB (ref 0.6–1.3)
Potassium: 5.8 — AB (ref 3.4–5.3)
Sodium: 136 — AB (ref 137–147)

## 2020-04-05 LAB — HEPATIC FUNCTION PANEL
ALT: 24 (ref 10–40)
AST: 18 (ref 14–40)
Alkaline Phosphatase: 54 (ref 25–125)
Bilirubin, Total: 0.4

## 2020-04-05 LAB — CBC: RBC: 5.42 — AB (ref 3.87–5.11)

## 2020-04-06 ENCOUNTER — Encounter: Payer: Self-pay | Admitting: Family Medicine

## 2020-04-30 ENCOUNTER — Other Ambulatory Visit: Payer: Self-pay | Admitting: Family Medicine

## 2020-05-10 DIAGNOSIS — Z9189 Other specified personal risk factors, not elsewhere classified: Secondary | ICD-10-CM | POA: Diagnosis not present

## 2020-05-10 DIAGNOSIS — D849 Immunodeficiency, unspecified: Secondary | ICD-10-CM | POA: Diagnosis not present

## 2020-05-10 DIAGNOSIS — Z880 Allergy status to penicillin: Secondary | ICD-10-CM | POA: Diagnosis not present

## 2020-05-10 DIAGNOSIS — Z5181 Encounter for therapeutic drug level monitoring: Secondary | ICD-10-CM | POA: Diagnosis not present

## 2020-05-30 ENCOUNTER — Ambulatory Visit: Payer: Medicare PPO | Admitting: Cardiology

## 2020-06-05 DIAGNOSIS — M150 Primary generalized (osteo)arthritis: Secondary | ICD-10-CM | POA: Diagnosis not present

## 2020-06-05 DIAGNOSIS — Z6826 Body mass index (BMI) 26.0-26.9, adult: Secondary | ICD-10-CM | POA: Diagnosis not present

## 2020-06-05 DIAGNOSIS — D75839 Thrombocytosis, unspecified: Secondary | ICD-10-CM | POA: Diagnosis not present

## 2020-06-05 DIAGNOSIS — N183 Chronic kidney disease, stage 3 unspecified: Secondary | ICD-10-CM | POA: Diagnosis not present

## 2020-06-05 DIAGNOSIS — L4050 Arthropathic psoriasis, unspecified: Secondary | ICD-10-CM | POA: Diagnosis not present

## 2020-06-05 DIAGNOSIS — E663 Overweight: Secondary | ICD-10-CM | POA: Diagnosis not present

## 2020-06-06 NOTE — Progress Notes (Signed)
Cardiology Office Note  Date: 06/10/2020   ID: [name], DOB [birth year], MRN [id number]  PCP:  Marin Olp, MD  Cardiologist:  Rozann Lesches, MD Electrophysiologist:  None   Chief Complaint: Cardiac follow up  History of Present Illness: Chase Anderson. is a 75 y.o. male with a history of CAD/MI, CKD stage III, collagen vascular disease, GERD, hyperlipidemia, type 2 diabetes, moderate to severe mitral regurgitation, bileaflet mitral valve prolapse.  History of mild nonobstructive CAD by cardiac catheterization in 2004.  In setting of suspected plaque rupture/aborted inferior infarct.  Had been managed medically.  Last seen by Dr. Domenic Polite  11/30/2019 for follow-up visit.  He reported no exertional chest pain.  Was walking 2 miles per day for exercise.  Denied any palpitations or syncope.  Previous echo September 2020 with EF 65 to 70%.  Normal RV contraction.  Mild bileaflet mitral prolapse with moderate to severe mitral regurgitation.  Plan was for follow-up echocardiogram.  Possibility of surgical repair was discussed with him.  He was continuing Zocor with previous LDL at 42.  Patient is here for follow-up today.  He denies any recent acute illnesses or hospitalizations.  He sees his primary care provider for RA and receives Remicade infusions with Rheumatology.  He denies any recent acute anginal symptoms.  Denies any palpitations or arrhythmias, orthostatic symptoms, CVA or TIA-like symptoms.  No PND or orthopnea.  No bleeding issues.  BP within normal limits today.  Denies any claudication-like symptoms, DVT or PE-like symptoms, lower extremity edema.  States he has had recent lab work with his PCP.    Past Medical History:  Diagnosis Date  . Allergic rhinitis   . Anxiety   . Atopic dermatitis   . CAD (coronary artery disease)    Mild at cardiac catheterization 2004  . CKD (chronic kidney disease) stage 3, GFR 30-59 ml/min (HCC)   . Collagen vascular  disease ([place])   . Erectile dysfunction   . Essential hypertension   . GERD (gastroesophageal reflux disease)   . Hyperlipidemia   . Myocardial infarction ([place]) 2004   Suspected plaque rupture/aborted inferior infarct 2004, nonobstructive CAD managed medically  . Osteoarthritis   . Type 2 diabetes mellitus ([place])     Past Surgical History:  Procedure Laterality Date  . COLONOSCOPY    . ESOPHAGOGASTRODUODENOSCOPY  04/23/2005  . KNEE ARTHROSCOPY     right  . POLYPECTOMY  2008   TA x 4   . TONSILLECTOMY    . TOOTH EXTRACTION     x 4 removed   . UPPER GASTROINTESTINAL ENDOSCOPY      Current Outpatient Medications  Medication Sig Dispense Refill  . ACCU-CHEK AVIVA PLUS test strip USE TO TEST BLOOD SUGAR EVERY DAY 100 strip 1  . amLODipine (NORVASC) 10 MG tablet TAKE 1 TABLET BY MOUTH EVERY DAY 100 tablet 3  . aspirin EC 81 MG tablet Take 81 mg by mouth daily.    Marland Kitchen buPROPion (WELLBUTRIN SR) 150 MG 12 hr tablet TAKE 1 TABLET BY MOUTH TWICE DAILY 180 tablet 3  . clindamycin (CLEOCIN) 300 MG capsule Take 300 mg by mouth as needed.     . famotidine (PEPCID) 20 MG tablet Take 1 tablet (20 mg total) by mouth 2 (two) times daily as needed for heartburn or indigestion. 60 tablet 11  . fluconazole (DIFLUCAN) 100 MG tablet Take 100 mg by mouth daily as needed.     . fluocinonide ointment (LIDEX) 0.05 %  APPLY TOPICALLY ONCE DAILY AS NEEDED 60 g 3  . glimepiride (AMARYL) 4 MG tablet TAKE 1 TABLET BY MOUTH DAILY BEFORE BREAKFAST 30 tablet 5  . glucose blood (ACCU-CHEK AVIVA PLUS) test strip     . glucose blood (ACCU-CHEK AVIVA PLUS) test strip USE TO TEST BLOOD SUGAR ONCE DAILY    . InFLIXimab (REMICADE IV) Inject into the vein every 8 (eight) weeks.     Marland Kitchen JANUVIA 50 MG tablet TAKE 1 TABLET BY MOUTH DAILY 30 tablet 5  . metFORMIN (GLUCOPHAGE-XR) 500 MG 24 hr tablet TAKE 2 TABLETS BY MOUTH DAILY WITH BREAKFAST 180 tablet 3  . metoprolol tartrate (LOPRESSOR) 25 MG tablet TAKE 1 TABLET(25 MG) BY  MOUTH TWICE DAILY 180 tablet 1  . nitroGLYCERIN (NITROSTAT) 0.4 MG SL tablet Place 0.4 mg under the tongue every 5 (five) minutes as needed for chest pain. Up to three time and then call the doctor    . simvastatin (ZOCOR) 20 MG tablet TAKE 1 TABLET(20 MG) BY MOUTH AT BEDTIME 90 tablet 1  . sulfamethoxazole-trimethoprim (BACTRIM DS,SEPTRA DS) 800-160 MG tablet Take 1 tablet by mouth every Monday, Wednesday, and Friday.  5  . tadalafil (CIALIS) 20 MG tablet Take 1 tablet (20 mg total) by mouth daily as needed for erectile dysfunction (no nitroglycerin within 72 hours). 10 tablet 5  . traMADol (ULTRAM) 50 MG tablet TAKE 1 TABLET BY MOUTH TWICE DAILY AS NEEDED 60 tablet 5  . valACYclovir (VALTREX) 500 MG tablet Take 1 tablet (500 mg total) by mouth as needed. (Patient taking differently: Take 500 mg by mouth 3 (three) times daily.) 60 tablet 3   No current facility-administered medications for this visit.   Allergies:  Codeine, Lisinopril, Penicillins, and Pristiq [desvenlafaxine succinate er]   Social History: The patient  reports that he quit smoking about 18 years ago. His smoking use included cigarettes. He has a 60.00 pack-year smoking history. He quit smokeless tobacco use about 18 years ago.  His smokeless tobacco use included chew. He reports that he does not drink alcohol and does not use drugs.   Family History: The patient's family history includes COPD in his father; Colon polyps in his father; Dementia in his mother; Diabetes in his brother; Other in his mother.   ROS:  Please see the history of present illness. Otherwise, complete review of systems is positive for none.  All other systems are reviewed and negative.   Physical Exam: VS:  BP 136/72   Pulse 71   Ht 5\' 11"  (1.803 m)   Wt 188 lb 3.2 oz (85.4 kg)   SpO2 97%   BMI 26.25 kg/m , BMI Body mass index is 26.25 kg/m.  Wt Readings from Last 3 Encounters:  06/08/20 188 lb 3.2 oz (85.4 kg)  02/25/20 185 lb 3.2 oz (84 kg)   11/30/19 186 lb (84.4 kg)    General: Patient appears comfortable at rest. Neck: Supple, no elevated JVP or carotid bruits, no thyromegaly. Lungs: Clear to auscultation, nonlabored breathing at rest. Cardiac: Regular rate and rhythm, no S3 or significant systolic murmur, no pericardial rub. Extremities: No pitting edema, distal pulses 2+. Skin: Warm and dry. Musculoskeletal: No kyphosis. Neuropsychiatric: Alert and oriented x3, affect grossly appropriate.  ECG:  EKG November 30, 2019 sinus rhythm with first-degree AV block rate of 67.  T wave abnormality consider inferior ischemia  Recent Labwork: 02/25/2020: BUN 26 04/05/2020: ALT 24; AST 18; Creatinine 12.0; Hemoglobin 15.9; Platelets 817; Potassium 5.8; Sodium 136  Component Value Date/Time   CHOL 90 02/25/2020 0957   TRIG 90 02/25/2020 0957   HDL 32 (L) 02/25/2020 0957   CHOLHDL 2.8 02/25/2020 0957   VLDL 27.0 02/15/2019 1010   LDLCALC 41 02/25/2020 0957    Other Studies Reviewed Today:  12/30/2019 Echo  1. Left ventricular ejection fraction, by estimation, is 55 to 60%. The left ventricle has normal function. The left ventricle has no regional wall motion abnormalities. There is mild left ventricular hypertrophy. Left ventricular diastolic parameters are consistent with Grade I diastolic dysfunction (impaired relaxation). 2. Right ventricular systolic function is normal. The right ventricular size is normal. There is normal pulmonary artery systolic pressure. The estimated right ventricular systolic pressure is 32.2 mmHg. 3. Left atrial size was mildly dilated. 4. The mitral valve is myxomatous with mild to moderate bileaflet prolapse. Moderate to severe mitral valve regurgitation. 5. The aortic valve is tricuspid. Aortic valve regurgitation is mild. 6. The inferior vena cava is normal in size with greater than 50% respiratory variability, suggesting right atrial pressure of 3 mmHg.   Exercise Myoview  09/01/2015:  There was no ST segment deviation noted during stress.  Defect 1: There is a medium defect of mild severity present in the basal inferoseptal, basal inferior, mid inferoseptal, mid inferior, apical septal and apical inferior location.  Findings consistent with prior myocardial infarction.  This is a low risk study.  Nuclear stress EF: 60%.  Echocardiogram 12/09/2018: 1. Left ventricular ejection fraction, by visual estimation, is 65 to  70%. The left ventricle has normal function. Normal left ventricular size.  There is mildly increased left ventricular hypertrophy.  2. Left ventricular diastolic Doppler parameters are consistent with  impaired relaxation pattern of LV diastolic filling.  3. Global right ventricle has normal systolic function.The right  ventricular size is normal. No increase in right ventricular wall  thickness.  4. Left atrial size was mildly dilated.  5. Right atrial size was normal.  6. Mild mitral annular calcification.  7. Mild to moderate aortic valve annular calcification.  8. The mitral valve is degenerative. Moderate to severe mitral valve  regurgitation.  9. Mild bileaflet mitral prolapse.  10. The tricuspid valve is grossly normal. Tricuspid valve regurgitation  is trivial.  11. The aortic valve is tricuspid Aortic valve regurgitation is mild by  color flow Doppler. Mild aortic valve sclerosis without stenosis.  12. The pulmonic valve was thickened. Pulmonic valve regurgitation is not  visualized by color flow Doppler.  13. Normal pulmonary artery systolic pressure.  14. The inferior vena cava is normal in size with greater than 50%  respiratory variability, suggesting right atrial pressure of 3 mmHg.   Assessment and Plan:  1. CAD in native artery   2. Nonrheumatic mitral valve regurgitation   3. Mixed hyperlipidemia    1. CAD in native artery Denies any anginal or exertional symptoms. Continue aspirin 81 mg po daily,  Metoprolol 25 mg po bid, NTG SL 0.4 prn.  2. Nonrheumatic mitral valve regurgitation Denies any symptoms. Recent echocardiogram October 2021 demonstrated no significant change in mitral regurgitation. MR moderate to severe. Will repeat in 6 months prior to follow up  3. Mixed hyperlipidemia Continue Simvastatin 20 mg po daily. Lipid panel 02/25/2020 : TC 90, HDL, 32, TG, 90, LDL 41  4. Essential Hypertension BP 136/72. Continue Amlodipine 10 mg po daily, Metoprolol 25 mg po bid   Medication Adjustments/Labs and Tests Ordered: Current medicines are reviewed at length with the patient today.  Concerns regarding medicines are outlined above.   Disposition: Follow-up with Dr Domenic Polite or APP 6 months  Signed, Levell July, NP 06/10/2020 6:01 PM    [facility] at [place], [place], [place] [address] Phone: [phone]; Fax: [phone]

## 2020-06-07 DIAGNOSIS — L4050 Arthropathic psoriasis, unspecified: Secondary | ICD-10-CM | POA: Diagnosis not present

## 2020-06-08 ENCOUNTER — Other Ambulatory Visit: Payer: Self-pay

## 2020-06-08 ENCOUNTER — Encounter: Payer: Self-pay | Admitting: Family Medicine

## 2020-06-08 ENCOUNTER — Ambulatory Visit: Payer: Medicare PPO | Admitting: Family Medicine

## 2020-06-08 VITALS — BP 136/72 | HR 71 | Ht 71.0 in | Wt 188.2 lb

## 2020-06-08 DIAGNOSIS — I340 Nonrheumatic mitral (valve) insufficiency: Secondary | ICD-10-CM

## 2020-06-08 DIAGNOSIS — I2510 Atherosclerotic heart disease of native coronary artery without angina pectoris: Secondary | ICD-10-CM | POA: Diagnosis not present

## 2020-06-08 DIAGNOSIS — E782 Mixed hyperlipidemia: Secondary | ICD-10-CM

## 2020-06-08 NOTE — Patient Instructions (Signed)
Medication Instructions:  Continue all current medications.   Labwork: none  Testing/Procedures: none  Follow-Up: 6 months   Any Other Special Instructions Will Be Listed Below (If Applicable).   If you need a refill on your cardiac medications before your next appointment, please call your pharmacy.  

## 2020-06-12 ENCOUNTER — Telehealth: Payer: Self-pay | Admitting: *Deleted

## 2020-06-12 DIAGNOSIS — I340 Nonrheumatic mitral (valve) insufficiency: Secondary | ICD-10-CM

## 2020-06-12 NOTE — Telephone Encounter (Signed)
Patient notified and verbalized understanding.  He is agreeable to doing the Echo.  Order entered & pcc made aware.

## 2020-06-12 NOTE — Telephone Encounter (Signed)
-----   Message from Verta Ellen., NP sent at 06/10/2020  6:13 PM EDT ----- Regarding: Order repeat echo in 6 months prior to follow up [facility]. I forgot to mention. We need to get a follow up echocardiogram in 6 months just prior to follow up on him for his moderate to severe mitral valve regurgitation. Thanks

## 2020-07-29 ENCOUNTER — Other Ambulatory Visit: Payer: Self-pay | Admitting: Family Medicine

## 2020-07-29 DIAGNOSIS — I10 Essential (primary) hypertension: Secondary | ICD-10-CM

## 2020-08-02 DIAGNOSIS — L4050 Arthropathic psoriasis, unspecified: Secondary | ICD-10-CM | POA: Diagnosis not present

## 2020-09-04 ENCOUNTER — Other Ambulatory Visit: Payer: Self-pay | Admitting: Family Medicine

## 2020-09-04 DIAGNOSIS — E7800 Pure hypercholesterolemia, unspecified: Secondary | ICD-10-CM

## 2020-09-27 ENCOUNTER — Other Ambulatory Visit: Payer: Self-pay | Admitting: Family Medicine

## 2020-09-27 DIAGNOSIS — F3342 Major depressive disorder, recurrent, in full remission: Secondary | ICD-10-CM

## 2020-09-27 DIAGNOSIS — R5383 Other fatigue: Secondary | ICD-10-CM | POA: Diagnosis not present

## 2020-09-27 DIAGNOSIS — Z79899 Other long term (current) drug therapy: Secondary | ICD-10-CM | POA: Diagnosis not present

## 2020-09-27 DIAGNOSIS — Z111 Encounter for screening for respiratory tuberculosis: Secondary | ICD-10-CM | POA: Diagnosis not present

## 2020-09-27 DIAGNOSIS — L4050 Arthropathic psoriasis, unspecified: Secondary | ICD-10-CM | POA: Diagnosis not present

## 2020-10-17 ENCOUNTER — Ambulatory Visit: Payer: Medicare PPO | Admitting: Family Medicine

## 2020-10-26 ENCOUNTER — Other Ambulatory Visit: Payer: Self-pay | Admitting: Cardiology

## 2020-10-27 ENCOUNTER — Other Ambulatory Visit: Payer: Self-pay | Admitting: Cardiology

## 2020-10-27 ENCOUNTER — Other Ambulatory Visit: Payer: Self-pay | Admitting: Family Medicine

## 2020-10-27 ENCOUNTER — Other Ambulatory Visit: Payer: Self-pay

## 2020-11-19 ENCOUNTER — Encounter: Payer: Self-pay | Admitting: Gastroenterology

## 2020-11-22 DIAGNOSIS — L4050 Arthropathic psoriasis, unspecified: Secondary | ICD-10-CM | POA: Diagnosis not present

## 2020-11-26 ENCOUNTER — Other Ambulatory Visit: Payer: Self-pay | Admitting: Family Medicine

## 2020-11-30 ENCOUNTER — Telehealth: Payer: Self-pay | Admitting: *Deleted

## 2020-11-30 ENCOUNTER — Ambulatory Visit (INDEPENDENT_AMBULATORY_CARE_PROVIDER_SITE_OTHER): Payer: Medicare PPO

## 2020-11-30 DIAGNOSIS — I340 Nonrheumatic mitral (valve) insufficiency: Secondary | ICD-10-CM | POA: Diagnosis not present

## 2020-11-30 LAB — ECHOCARDIOGRAM COMPLETE
AR max vel: 1.8 cm2
AV Area VTI: 1.77 cm2
AV Area mean vel: 1.95 cm2
AV Mean grad: 5.9 mmHg
AV Peak grad: 13.4 mmHg
AV Vena cont: 0.57 cm
Ao pk vel: 1.83 m/s
Area-P 1/2: 3.02 cm2
Calc EF: 55.8 %
MV M vel: 5.68 m/s
MV Peak grad: 128.8 mmHg
P 1/2 time: 481 msec
Radius: 0.6 cm
S' Lateral: 2.9 cm
Single Plane A2C EF: 53.1 %
Single Plane A4C EF: 61.9 %

## 2020-11-30 NOTE — Telephone Encounter (Signed)
-----   Message from Verta Ellen., NP sent at 11/30/2020  1:57 PM EDT ----- Please call the patient and let him know the echocardiogram shows he has good pumping function of the heart.  He has a moderate to severely leaking mitral valve.  Let him know this is unchanged since last echocardiogram.  Tell him we will continue to get echoes around every 6 months to monitor the valve.  He has a very mild leak in his aortic valve.  In the meantime if he has any symptoms of worsening shortness of breath to give [place] a call.  Verta Ellen, NP  11/30/2020 1:54 PM

## 2020-11-30 NOTE — Telephone Encounter (Signed)
Laurine Blazer, LPN  3/38/3291  9:16 PM EDT Back to Top    Notified, copy to pcp.

## 2020-12-06 DIAGNOSIS — D75839 Thrombocytosis, unspecified: Secondary | ICD-10-CM | POA: Diagnosis not present

## 2020-12-06 DIAGNOSIS — N183 Chronic kidney disease, stage 3 unspecified: Secondary | ICD-10-CM | POA: Diagnosis not present

## 2020-12-06 DIAGNOSIS — E663 Overweight: Secondary | ICD-10-CM | POA: Diagnosis not present

## 2020-12-06 DIAGNOSIS — L4050 Arthropathic psoriasis, unspecified: Secondary | ICD-10-CM | POA: Diagnosis not present

## 2020-12-06 DIAGNOSIS — Z6825 Body mass index (BMI) 25.0-25.9, adult: Secondary | ICD-10-CM | POA: Diagnosis not present

## 2020-12-06 DIAGNOSIS — M150 Primary generalized (osteo)arthritis: Secondary | ICD-10-CM | POA: Diagnosis not present

## 2020-12-07 ENCOUNTER — Other Ambulatory Visit: Payer: Self-pay

## 2020-12-07 ENCOUNTER — Encounter: Payer: Self-pay | Admitting: Family Medicine

## 2020-12-07 ENCOUNTER — Ambulatory Visit: Payer: Medicare PPO | Admitting: Family Medicine

## 2020-12-07 VITALS — BP 130/60 | HR 68 | Temp 97.9°F | Ht 71.0 in | Wt 182.0 lb

## 2020-12-07 DIAGNOSIS — K219 Gastro-esophageal reflux disease without esophagitis: Secondary | ICD-10-CM

## 2020-12-07 DIAGNOSIS — Z79899 Other long term (current) drug therapy: Secondary | ICD-10-CM

## 2020-12-07 DIAGNOSIS — L4050 Arthropathic psoriasis, unspecified: Secondary | ICD-10-CM | POA: Diagnosis not present

## 2020-12-07 DIAGNOSIS — D75839 Thrombocytosis, unspecified: Secondary | ICD-10-CM

## 2020-12-07 DIAGNOSIS — Z23 Encounter for immunization: Secondary | ICD-10-CM | POA: Diagnosis not present

## 2020-12-07 DIAGNOSIS — F3342 Major depressive disorder, recurrent, in full remission: Secondary | ICD-10-CM

## 2020-12-07 DIAGNOSIS — D72819 Decreased white blood cell count, unspecified: Secondary | ICD-10-CM

## 2020-12-07 DIAGNOSIS — E875 Hyperkalemia: Secondary | ICD-10-CM

## 2020-12-07 DIAGNOSIS — D8489 Other immunodeficiencies: Secondary | ICD-10-CM | POA: Diagnosis not present

## 2020-12-07 DIAGNOSIS — I10 Essential (primary) hypertension: Secondary | ICD-10-CM

## 2020-12-07 DIAGNOSIS — E1169 Type 2 diabetes mellitus with other specified complication: Secondary | ICD-10-CM

## 2020-12-07 DIAGNOSIS — E785 Hyperlipidemia, unspecified: Secondary | ICD-10-CM

## 2020-12-07 DIAGNOSIS — E11319 Type 2 diabetes mellitus with unspecified diabetic retinopathy without macular edema: Secondary | ICD-10-CM

## 2020-12-07 DIAGNOSIS — N183 Chronic kidney disease, stage 3 unspecified: Secondary | ICD-10-CM

## 2020-12-07 LAB — HEMOGLOBIN A1C: Hgb A1c MFr Bld: 7.3 % — ABNORMAL HIGH (ref 4.6–6.5)

## 2020-12-07 LAB — COMPREHENSIVE METABOLIC PANEL
ALT: 17 U/L (ref 0–53)
AST: 18 U/L (ref 0–37)
Albumin: 4.4 g/dL (ref 3.5–5.2)
Alkaline Phosphatase: 50 U/L (ref 39–117)
BUN: 22 mg/dL (ref 6–23)
CO2: 23 mEq/L (ref 19–32)
Calcium: 9.7 mg/dL (ref 8.4–10.5)
Chloride: 105 mEq/L (ref 96–112)
Creatinine, Ser: 1.65 mg/dL — ABNORMAL HIGH (ref 0.40–1.50)
GFR: 40.29 mL/min — ABNORMAL LOW (ref >60.00)
Glucose, Bld: 144 mg/dL — ABNORMAL HIGH (ref 70–99)
Potassium: 4.6 mEq/L (ref 3.5–5.1)
Sodium: 136 mEq/L (ref 135–145)
Total Bilirubin: 0.4 mg/dL (ref 0.2–1.2)
Total Protein: 7 g/dL (ref 6.0–8.3)

## 2020-12-07 LAB — CBC WITH DIFFERENTIAL/PLATELET
Basophils Absolute: 0.1 10*3/uL (ref 0.0–0.1)
Basophils Relative: 1.3 % (ref 0.0–3.0)
Eosinophils Absolute: 0.3 10*3/uL (ref 0.0–0.7)
Eosinophils Relative: 4.1 % (ref 0.0–5.0)
HCT: 38.4 % — ABNORMAL LOW (ref 39.0–52.0)
Hemoglobin: 13.1 g/dL (ref 13.0–17.0)
Lymphocytes Relative: 10.8 % — ABNORMAL LOW (ref 12.0–46.0)
Lymphs Abs: 0.8 10*3/uL (ref 0.7–4.0)
MCHC: 34.1 g/dL (ref 30.0–36.0)
MCV: 86.1 fl (ref 78.0–100.0)
Monocytes Absolute: 0.7 10*3/uL (ref 0.1–1.0)
Monocytes Relative: 9 % (ref 3.0–12.0)
Neutro Abs: 5.7 10*3/uL (ref 1.4–7.7)
Neutrophils Relative %: 74.8 % (ref 43.0–77.0)
Platelets: 890 10*3/uL — ABNORMAL HIGH (ref 150.0–400.0)
RBC: 4.46 Mil/uL (ref 4.22–5.81)
RDW: 15.7 % — ABNORMAL HIGH (ref 11.5–15.5)
WBC: 7.7 10*3/uL (ref 4.0–10.5)

## 2020-12-07 LAB — VITAMIN B12: Vitamin B-12: 212 pg/mL (ref 211–911)

## 2020-12-07 NOTE — Patient Instructions (Addendum)
Health Maintenance Due  Topic Date Due   OPHTHALMOLOGY EXAM   - Please have your ophthalmologist send [place] a copy of your diabetic eye exam.  04/08/2019   COVID-19 Vaccine (4 - Booster for Moderna series)   - Recommended getting Omicron specific booster only at your local pharmacy! Please let us know when you have received this vaccination.  04/08/2020   COLONOSCOPY (Pts 45-57yrs Insurance coverage will need to be confirmed)   - Please call [facility] and have your colonoscopy scheduled   GI contact Address: [address], [place], [place] [address] Phone: [phone]  09/14/2020   INFLUENZA VACCINE   - High Dose flu shot today. 10/09/2020   Please stop by lab before you go If you have mychart- we will send your results within 3 business days of [place] receiving them.  If you do not have mychart- we will call you about results within 5 business days of [place] receiving them.  *please also note that you will see labs on mychart as soon as they post. I will later go in and write notes on them- will say "notes from Dr. Yong Channel"  Recommended follow up: Return in about 6 months (around 06/06/2021) for a physical if A1c is below 7- if A1c is above 7, we will schedule for 4 months.

## 2020-12-07 NOTE — Progress Notes (Signed)
Phone [phone] In person visit   Subjective:   Chase Anderson. is a 75 y.o. year old very pleasant male patient who presents for/with See problem oriented charting Chief Complaint  Patient presents with   Depression   Diabetes   Hypertension   Hyperlipidemia   Chronic Kidney Disease   This visit occurred during the SARS-CoV-2 public health emergency.  Safety protocols were in place, including screening questions prior to the visit, additional usage of staff PPE, and extensive cleaning of exam room while observing appropriate contact time as indicated for disinfecting solutions.   Past Medical History-  Patient Active Problem List   Diagnosis Date Noted   Thrombocytosis 04/03/2016    Priority: 1.   Depression 03/27/2015    Priority: 1.   Psoriatic arthritis ([place]) 01/25/2013    Priority: 1.   Mitral valve prolapse 12/25/2010    Priority: 1.   Leukocytopenia 11/24/2006    Priority: 1.   Controlled type 2 diabetes mellitus with ophthalmic complication ([place]) 94/80/1655    Priority: 1.   CAD (coronary artery disease) with history MI 2004- no intervention 10/06/2006    Priority: 1.   Mitral valve regurgitation 02/15/2019    Priority: 2.   Dyshidrotic eczema 02/07/2016    Priority: 2.   CKD (chronic kidney disease), stage III ([place]) 09/15/2015    Priority: 2.   History of BPH 12/14/2009    Priority: 2.   Hyperlipidemia associated with type 2 diabetes mellitus ([place]) 10/06/2006    Priority: 2.   Hypertension associated with diabetes ([place]) 10/06/2006    Priority: 2.   GERD (gastroesophageal reflux disease) 08/24/2019    Priority: 3.   Diabetic retinopathy ([place]) 02/20/2017    Priority: 3.   ERECTILE DYSFUNCTION 11/26/2007    Priority: 3.   Candidiasis 11/25/2012   Immunodeficiency disorder with predominant T-cell defect ([place]) 11/19/2012    Medications- reviewed and updated Current Outpatient Medications  Medication Sig Dispense Refill   ACCU-CHEK AVIVA PLUS test  strip USE TO TEST BLOOD SUGAR EVERY DAY 100 strip 1   amLODipine (NORVASC) 10 MG tablet TAKE 1 TABLET BY MOUTH EVERY DAY 100 tablet 3   aspirin EC 81 MG tablet Take 81 mg by mouth daily.     buPROPion (WELLBUTRIN SR) 150 MG 12 hr tablet TAKE 1 TABLET BY MOUTH TWICE DAILY 180 tablet 3   clindamycin (CLEOCIN) 300 MG capsule Take 300 mg by mouth as needed.      famotidine (PEPCID) 20 MG tablet Take 1 tablet (20 mg total) by mouth 2 (two) times daily as needed for heartburn or indigestion. 60 tablet 11   fluconazole (DIFLUCAN) 100 MG tablet Take 100 mg by mouth daily as needed.      fluocinonide ointment (LIDEX) 0.05 % APPLY TOPICALLY ONCE DAILY AS NEEDED 60 g 3   glimepiride (AMARYL) 4 MG tablet Take 1 tablet (4 mg total) by mouth daily with breakfast. PLEASE SCHEDULE APPT WITH DR. Aquilla Voiles FOR FURTHER REFILLS [phone] 90 tablet 0   glucose blood (ACCU-CHEK AVIVA PLUS) test strip      glucose blood (ACCU-CHEK AVIVA PLUS) test strip USE TO TEST BLOOD SUGAR ONCE DAILY     InFLIXimab (REMICADE IV) Inject into the vein every 8 (eight) weeks.      JANUVIA 50 MG tablet TAKE 1 TABLET BY MOUTH DAILY 30 tablet 0   metFORMIN (GLUCOPHAGE-XR) 500 MG 24 hr tablet TAKE 2 TABLETS BY MOUTH DAILY WITH BREAKFAST 180 tablet 3   metoprolol  tartrate (LOPRESSOR) 25 MG tablet TAKE 1 TABLET(25 MG) BY MOUTH TWICE DAILY 180 tablet 1   nitroGLYCERIN (NITROSTAT) 0.4 MG SL tablet Place 0.4 mg under the tongue every 5 (five) minutes as needed for chest pain. Up to three time and then call the doctor     simvastatin (ZOCOR) 20 MG tablet TAKE 1 TABLET(20 MG) BY MOUTH AT BEDTIME 90 tablet 1   sulfamethoxazole-trimethoprim (BACTRIM DS,SEPTRA DS) 800-160 MG tablet Take 1 tablet by mouth every Monday, Wednesday, and Friday.  5   tadalafil (CIALIS) 20 MG tablet Take 1 tablet (20 mg total) by mouth daily as needed for erectile dysfunction (no nitroglycerin within 72 hours). 10 tablet 5   traMADol (ULTRAM) 50 MG tablet TAKE 1 TABLET BY  MOUTH TWICE DAILY AS NEEDED 60 tablet 5   valACYclovir (VALTREX) 500 MG tablet Take 1 tablet (500 mg total) by mouth as needed. (Patient taking differently: Take 500 mg by mouth 3 (three) times daily.) 60 tablet 3   No current facility-administered medications for this visit.     Objective:  BP 130/60   Pulse 68   Temp 97.9 F (36.6 C) (Temporal)   Ht 5\' 11"  (1.803 m)   Wt 182 lb (82.6 kg)   SpO2 98%   BMI 25.38 kg/m  Gen: NAD, resting comfortably CV: RRR no murmurs rubs or gallops Lungs: CTAB no crackles, wheeze, rhonchi Ext: no edema Skin: warm, dry    Assessment and Plan   #Psoriatic arthritis-continues on Remicade gets labs every 16 weeks through Dr. Amil Amen.  Follows closely with rheumatology.  I did prescribe his tramadol as needed in past- now through rheumatology -Also followed by [facility] infectious disease for immunodeficiency-on Bactrim 3 days a week. As needed clindamycin as needed as well as Diflucan. Overall stable- continue current meds and [facility] follow up   #Thrombocytosis S: chronically elevated platelets.  Has seen hematology/oncology at [facility] in 2021.  Thought to be potentially related to psoriatic arthritis or Remicade A/P: Suspect stable-update CBC with differential today. No reason to stop remicade  #CAD #hyperlipidemia S: Medication:  -compliant with aspirin and simvastatin 20 mg. Follows with cardiology. Nitroglycerin on hand but does not have to use. No chest pain or shortness of breath  .  On metoprolol as welltwice daily Lab Results  Component Value Date   CHOL 90 02/25/2020   HDL 32 (L) 02/25/2020   LDLCALC 41 02/25/2020   TRIG 90 02/25/2020   CHOLHDL 2.8 02/25/2020   A/P: CAD-asymptomatic- continue current meds Hyperlipidemia with excellent control-continue current medication -did have recent echo with moderate to severe mitral valve regurgitation- plan is every 6 months. Mild aortic valve regurgitation and calcification mild - no stenosis  listed  #Chronic kidney disease stage III #hyperkalemia S: GFR is typically in the 40s range typically -Patient knows to avoid NSAIDs  -Has not tolerated ACE inhibitor in the past.  Would consider ARB if needed but dont think potassium would tolerate - Bactrim could have contributed but patient needs this - considered nephrology opinion if potassium gets high A/P: hopefully stable- update labs today   # Depression S: Medication:Bupropion 150 mg twice Depression screen [facility] 2/9 12/07/2020 02/25/2020 12/24/2019  Decreased Interest 0 0 0  Down, Depressed, Hopeless 0 0 0  PHQ - 2 Score 0 0 0  Altered sleeping 0 0 -  Tired, decreased energy 3 0 -  Change in appetite 0 0 -  Feeling bad or failure about yourself  0 0 -  Trouble concentrating 0 0 -  Moving slowly or fidgety/restless 0 0 -  Suicidal thoughts 0 0 -  PHQ-9 Score 3 0 -  Difficult doing work/chores Not difficult at all Not difficult at all -  Some recent data might be hidden  A/P: Full remission-continue current medication  # Diabetes-using A1c goal of 7.5 or even 8 S: Medication:Amaryl 4 mg daily, Januvia 50 mg, Metformin 500 mg twice daily CBGs- rare checks- sugar was higher with dental infection/abscess had in october Lab Results  Component Value Date   HGBA1C 6.7 (H) 02/25/2020   HGBA1C 7.9 (H) 08/24/2019   HGBA1C 6.9 (H) 02/15/2019  A/P: hopefully stable- update a1ctoday. Continue current meds for now -B12 has been low normal in the past - not on supplement -has November eye exam -advised omicron booster  #History of 12 polyps last year-due for annual follow-up in July-he states has not heard from [place] yet-I gave him the number to call and schedule  #hypertension S: medication: Amlodipine 10 mg daily, metoprolol 25 mg twice daily  BP Readings from Last 3 Encounters:  12/07/20 130/60  06/08/20 136/72  02/25/20 125/65  A/P: Controlled. Continue current medications.    # GERD S:Medication: Pepcid 20  mg A/P: reasonable control- continue current meds   Recommended follow up: Return in about 6 months (around 06/06/2021) for a physical or sooner if needed. Future Appointments  Date Time Provider [place]  12/29/2020  8:00 AM [facility] HE[facility] COACH [facility]   Lab/Order associations:   ICD-10-CM   1. Hyperlipidemia associated with type 2 diabetes mellitus ([place])  E11.69    E78.5     2. Controlled type 2 diabetes mellitus with retinopathy, without long-term current use of insulin, macular edema presence unspecified, unspecified laterality, unspecified retinopathy severity (HCC)  E11.319 CBC with Differential/Platelet    Comprehensive metabolic panel    Hemoglobin A1c    3. Stage 3 chronic kidney disease, unspecified whether stage 3a or 3b CKD (HCC)  N18.30     4. Recurrent major depressive disorder, in full remission ([place])  F33.42     5. Psoriatic arthritis ([place])  L40.50     6. 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) Chronic D84.89     7. Essential hypertension  I10 Comprehensive metabolic panel    8. Gastroesophageal reflux disease without esophagitis  K21.9     9. Need for immunization against influenza  Z23 Flu Vaccine QUAD High Dose(Fluad)    10. High risk medication use  Z79.899 Vitamin B12    11. Thrombocytosis  D75.839     12. Leukopenia, unspecified type  D72.819     13. Hyperkalemia  E87.5 Comprehensive metabolic panel     No orders of the defined types were placed in this encounter.  I,Harris Phan,acting as a Education administrator for Garret Reddish, MD.,have documented all relevant documentation on the behalf of Garret Reddish, MD,as directed by  Garret Reddish, MD while in the presence of Garret Reddish, MD.  I, Garret Reddish, MD, have reviewed all documentation for this visit. The documentation on 12/07/20 for the exam, diagnosis, procedures, and orders are all accurate and complete.  Return precautions advised.  Garret Reddish, MD

## 2020-12-26 ENCOUNTER — Other Ambulatory Visit: Payer: Self-pay | Admitting: Family Medicine

## 2020-12-29 ENCOUNTER — Ambulatory Visit (INDEPENDENT_AMBULATORY_CARE_PROVIDER_SITE_OTHER): Payer: Medicare PPO

## 2020-12-29 DIAGNOSIS — Z1211 Encounter for screening for malignant neoplasm of colon: Secondary | ICD-10-CM | POA: Diagnosis not present

## 2020-12-29 DIAGNOSIS — Z0000 Encounter for general adult medical examination without abnormal findings: Secondary | ICD-10-CM | POA: Diagnosis not present

## 2020-12-29 DIAGNOSIS — Z800 Family history of malignant neoplasm of digestive organs: Secondary | ICD-10-CM

## 2020-12-29 NOTE — Progress Notes (Signed)
Virtual Visit via Telephone Note  I connected with  Chase Anderson. on 12/29/20 at  8:00 AM EDT by telephone and verified that I am speaking with the correct person using two identifiers.  Medicare Annual Wellness visit completed telephonically due to Covid-19 pandemic.   Persons participating in this call: This Health Coach and this patient.   Location: Patient: Home Provider: Office   I discussed the limitations, risks, security and privacy concerns of performing an evaluation and management service by telephone and the availability of in person appointments. The patient expressed understanding and agreed to proceed.  Unable to perform video visit due to video visit attempted and failed and/or patient does not have video capability.   Some vital signs may be absent or patient reported.   Willette Brace, LPN   Subjective:   Chase Anderson. is a 75 y.o. male who presents for Medicare Annual/Subsequent preventive examination.  Review of Systems     Cardiac Risk Factors include: advanced age (>26men, >75 women);hypertension;diabetes mellitus;dyslipidemia;male gender     Objective:    There were no vitals filed for this visit. There is no height or weight on file to calculate BMI.  Advanced Directives 12/29/2020 12/24/2019 11/23/2018 11/20/2017 10/30/2016 08/03/2015  Does Patient Have a Medical Advance Directive? No No No No No No  Would patient like information on creating a medical advance directive? No - Patient declined No - Patient declined Yes (MAU/Ambulatory/Procedural Areas - Information given) - - No - patient declined information    Current Medications (verified) Outpatient Encounter Medications as of 12/29/2020  Medication Sig   ACCU-CHEK AVIVA PLUS test strip USE TO TEST BLOOD SUGAR EVERY DAY   amLODipine (NORVASC) 10 MG tablet TAKE 1 TABLET BY MOUTH EVERY DAY   aspirin EC 81 MG tablet Take 81 mg by mouth daily.   buPROPion (WELLBUTRIN SR) 150 MG 12 hr  tablet TAKE 1 TABLET BY MOUTH TWICE DAILY   clindamycin (CLEOCIN) 300 MG capsule Take 300 mg by mouth as needed.    Cyanocobalamin (B-12) 1000 MCG CAPS    famotidine (PEPCID) 20 MG tablet Take 1 tablet (20 mg total) by mouth 2 (two) times daily as needed for heartburn or indigestion.   fluconazole (DIFLUCAN) 100 MG tablet Take 100 mg by mouth daily as needed.    fluocinonide ointment (LIDEX) 0.05 % APPLY TOPICALLY ONCE DAILY AS NEEDED   glimepiride (AMARYL) 4 MG tablet Take 1 tablet (4 mg total) by mouth daily with breakfast. PLEASE SCHEDULE APPT WITH DR. HUNTER FOR FURTHER REFILLS [phone]   glucose blood (ACCU-CHEK AVIVA PLUS) test strip    glucose blood (ACCU-CHEK AVIVA PLUS) test strip USE TO TEST BLOOD SUGAR ONCE DAILY   InFLIXimab (REMICADE IV) Inject into the vein every 8 (eight) weeks.    JANUVIA 50 MG tablet TAKE 1 TABLET BY MOUTH DAILY   metFORMIN (GLUCOPHAGE-XR) 500 MG 24 hr tablet TAKE 2 TABLETS BY MOUTH DAILY WITH BREAKFAST   metoprolol tartrate (LOPRESSOR) 25 MG tablet TAKE 1 TABLET(25 MG) BY MOUTH TWICE DAILY   nitroGLYCERIN (NITROSTAT) 0.4 MG SL tablet Place 0.4 mg under the tongue every 5 (five) minutes as needed for chest pain. Up to three time and then call the doctor   simvastatin (ZOCOR) 20 MG tablet TAKE 1 TABLET(20 MG) BY MOUTH AT BEDTIME   sulfamethoxazole-trimethoprim (BACTRIM DS,SEPTRA DS) 800-160 MG tablet Take 1 tablet by mouth every Monday, Wednesday, and Friday.   tadalafil (CIALIS) 20 MG tablet Take 1  tablet (20 mg total) by mouth daily as needed for erectile dysfunction (no nitroglycerin within 72 hours).   traMADol (ULTRAM) 50 MG tablet TAKE 1 TABLET BY MOUTH TWICE DAILY AS NEEDED   valACYclovir (VALTREX) 500 MG tablet Take 1 tablet (500 mg total) by mouth as needed. (Patient taking differently: Take 500 mg by mouth 3 (three) times daily.)   No facility-administered encounter medications on file as of 12/29/2020.    Allergies (verified) Amoxicillin,  Codeine, Lisinopril, Methotrexate, Penicillins, and Pristiq [desvenlafaxine succinate er]   History: Past Medical History:  Diagnosis Date   Allergic rhinitis    Anxiety    Atopic dermatitis    CAD (coronary artery disease)    Mild at cardiac catheterization 2004   CKD (chronic kidney disease) stage 3, GFR 30-59 ml/min (HCC)    Collagen vascular disease (HCC)    Erectile dysfunction    Essential hypertension    GERD (gastroesophageal reflux disease)    Hyperlipidemia    Myocardial infarction ([place]) 2004   Suspected plaque rupture/aborted inferior infarct 2004, nonobstructive CAD managed medically   Osteoarthritis    Type 2 diabetes mellitus ([place])    Past Surgical History:  Procedure Laterality Date   COLONOSCOPY     ESOPHAGOGASTRODUODENOSCOPY  04/23/2005   KNEE ARTHROSCOPY     right   POLYPECTOMY  2008   TA x 4    TONSILLECTOMY     TOOTH EXTRACTION     x 4 removed    UPPER GASTROINTESTINAL ENDOSCOPY     Family History  Problem Relation Age of Onset   Dementia Mother    Other Mother        abdominal issues led to death- apparently inoperable growth-  likely cancer   COPD Father        led to death   Colon polyps Father    Diabetes Brother    Colon cancer Neg Hx    Esophageal cancer Neg Hx    Rectal cancer Neg Hx    Stomach cancer Neg Hx    Social History   Socioeconomic History   Marital status: Married    Spouse name: Not on file   Number of children: Not on file   Years of education: Not on file   Highest education level: Not on file  Occupational History   Not on file  Tobacco Use   Smoking status: Former    Packs/day: 2.00    Years: 30.00    Pack years: 60.00    Types: Cigarettes    Quit date: 12/09/2001    Years since quitting: 19.0   Smokeless tobacco: Former    Types: Chew    Quit date: 03/11/2002  Vaping Use   Vaping Use: Never used  Substance and Sexual Activity   Alcohol use: No    Alcohol/week: 0.0 standard drinks   Drug use: No   Sexual  activity: Not on file  Other Topics Concern   Not on file  Social History Narrative   Married. 2 children. 2 stepchildren. 7 grandkids including step   Take care of daughter of law Edris Friedt- son left her with 40 year old.       Retired after  26 years [place] [employer]      Hobbies: Air cabin crew, gardening   Social Determinants of Radio broadcast assistant Strain: Low Risk    Difficulty of Paying Living Expenses: Not hard at all  Food Insecurity: No Food Insecurity   Worried About Estate manager/land agent of  Food in the Last Year: Never true   [place] in the Last Year: Never true  Transportation Needs: No Transportation Needs   Lack of Transportation (Medical): No   Lack of Transportation (Non-Medical): No  Physical Activity: Insufficiently Active   Days of Exercise per Week: 3 days   Minutes of Exercise per Session: 30 min  Stress: No Stress Concern Present   Feeling of Stress : Not at all  Social Connections: Moderately Integrated   Frequency of Communication with Friends and Family: More than three times a week   Frequency of Social Gatherings with Friends and Family: More than three times a week   Attends Religious Services: More than 4 times per year   Active Member of [employer] or Organizations: No   Attends Music therapist: Never   Marital Status: Married    Tobacco Counseling Counseling given: Not Answered   Clinical Intake:  Pre-visit preparation completed: Yes  Pain : No/denies pain     BMI - recorded: 25.4 Nutritional Status: BMI 25 -29 Overweight Nutritional Risks: None Diabetes: Yes CBG done?: Yes (110) CBG resulted in Enter/ Edit results?: No Did pt. bring in CBG monitor from home?: No  How often do you need to have someone help you when you read instructions, pamphlets, or other written materials from your doctor or pharmacy?: 1 - Never  Diabetic?Nutrition Risk Assessment:  Has the patient had any N/V/D within the last 2 months?  No  Does the  patient have any non-healing wounds?  No  Has the patient had any unintentional weight loss or weight gain?  No   Diabetes:  Is the patient diabetic?  Yes  If diabetic, was a CBG obtained today?  Yes  Did the patient bring in their glucometer from home?  No  How often do you monitor your CBG's? Daily.   Financial Strains and Diabetes Management:  Are you having any financial strains with the device, your supplies or your medication? No .  Does the patient want to be seen by Chronic Care Management for management of their diabetes?  No  Would the patient like to be referred to a Nutritionist or for Diabetic Management?  No   Diabetic Exams:  Diabetic Eye Exam: Overdue for diabetic eye exam. Pt has been advised about the importance in completing this exam. Patient advised to call and schedule an eye exam. Diabetic Foot Exam: Completed 02/25/20   Interpreter Needed?: No  Information entered by :: Charlott Rakes, LPN   Activities of Daily Living In your present state of health, do you have any difficulty performing the following activities: 12/29/2020  Hearing? Y  Comment HOH  Vision? N  Difficulty concentrating or making decisions? N  Walking or climbing stairs? N  Dressing or bathing? N  Doing errands, shopping? N  Preparing Food and eating ? N  Using the Toilet? N  In the past six months, have you accidently leaked urine? N  Do you have problems with loss of bowel control? N  Managing your Medications? N  Managing your Finances? N  Housekeeping or managing your Housekeeping? N  Some recent data might be hidden    Patient Care Team: Marin Olp, MD as PCP - General (Family Medicine) Satira Sark, MD as PCP - Cardiology (Cardiology) Unice Bailey, MD as Consulting Physician (Rheumatology) Bayard Hugger Danie Binder, MD as Consulting Physician (Infectious Diseases) Hennie Duos, MD as Consulting Physician (Rheumatology) Madelin Headings, DO as Consulting  Physician (  Optometry)  Indicate any recent Medical Services you may have received from other than [facility] providers in the past year (date may be approximate).     Assessment:   This is a routine wellness examination for Jessi.  Hearing/Vision screen Hearing Screening - Comments:: Pt stated HOH  Vision Screening - Comments:: Pt follows up with Dr Venetia Constable for annual eye exams   Dietary issues and exercise activities discussed: Current Exercise Habits: Home exercise routine, Type of exercise: walking;Other - see comments (house work), Time (Minutes): 30, Frequency (Times/Week): 3, Weekly Exercise (Minutes/Week): 90   Goals Addressed             This Visit's Progress    Patient Stated       To get back to walking        Depression Screen PHQ 2/9 Scores 12/29/2020 12/07/2020 02/25/2020 12/24/2019 08/24/2019 02/15/2019 11/23/2018  PHQ - 2 Score 0 0 0 0 0 1 0  PHQ- 9 Score - 3 0 - 0 7 -    Fall Risk Fall Risk  12/29/2020 12/07/2020 02/25/2020 12/24/2019 02/15/2019  Falls in the past year? 0 1 0 0 0  Number falls in past yr: 0 1 0 0 0  Injury with Fall? 0 0 0 0 0  Risk for fall due to : Impaired vision No Fall Risks - Impaired vision -  Follow up Falls prevention discussed Falls evaluation completed - Falls prevention discussed -    FALL RISK PREVENTION PERTAINING TO THE HOME:  Any stairs in or around the home? Yes  If so, are there any without handrails? No  Home free of loose throw rugs in walkways, pet beds, electrical cords, etc? Yes  Adequate lighting in your home to reduce risk of falls? Yes   ASSISTIVE DEVICES UTILIZED TO PREVENT FALLS:  Life alert? No  Use of a cane, walker or w/c? No  Grab bars in the bathroom? No  Shower chair or bench in shower? No  Elevated toilet seat or a handicapped toilet? No   TIMED UP AND GO:  Was the test performed? No .   Cognitive Function: MMSE - Mini Mental State Exam 11/20/2017 10/30/2016  Not completed: (No Data) (No Data)      6CIT Screen 12/29/2020 12/24/2019 11/23/2018  What Year? 0 points 0 points 0 points  What month? 0 points 0 points 0 points  What time? 0 points - 0 points  Count back from 20 0 points 0 points 0 points  Months in reverse 0 points 0 points 0 points  Repeat phrase 2 points 0 points 0 points  Total Score 2 - 0    Immunizations Immunization History  Administered Date(s) Administered   Fluad Quad(high Dose 65+) 11/23/2018, 12/07/2020   Influenza Split 01/23/2012, 11/19/2012   Influenza Whole 11/26/2007, 12/14/2009   Influenza, High Dose Seasonal PF 02/01/2014, 02/06/2015, 02/07/2016, 12/06/2016   Influenza, Seasonal, Injecte, Preservative Fre 01/05/2009   Influenza,inj,Quad PF,6+ Mos 11/19/2012, 12/30/2019   Influenza-Unspecified 01/05/2009, 02/01/2014, 02/06/2015, 02/07/2016, 12/06/2016, 11/27/2017, 11/23/2018   Moderna Sars-Covid-2 Vaccination 04/07/2019, 05/05/2019, 01/15/2020, 07/15/2020   Pneumococcal Conjugate-13 06/02/2013   Pneumococcal Polysaccharide-23 03/11/2004, 06/29/2009   Td 03/11/2002   Tetanus 03/10/2013   Zoster Recombinat (Shingrix) 06/10/2017, 10/01/2017    TDAP status: Up to date  Flu Vaccine status: Up to date  Pneumococcal vaccine status: Due, Education has been provided regarding the importance of this vaccine. Advised may receive this vaccine at local pharmacy or Health Dept. Aware to provide a copy of  the vaccination record if obtained from local pharmacy or Health Dept. Verbalized acceptance and understanding.  Covid-19 vaccine status: Completed vaccines  Qualifies for Shingles Vaccine? Yes   Zostavax completed Yes   Shingrix Completed?: Yes  Screening Tests Health Maintenance  Topic Date Due   Pneumonia Vaccine 61+ Years old (4 - PPSV23 if available, else PCV20) 06/30/2014   OPHTHALMOLOGY EXAM  04/08/2019   COVID-19 Vaccine (5 - Booster for Moderna series) 09/09/2020   COLONOSCOPY (Pts 45-62yrs Insurance coverage will need to be confirmed)   09/14/2020   URINE MICROALBUMIN  02/24/2021   FOOT EXAM  02/24/2021   HEMOGLOBIN A1C  06/06/2021   TETANUS/TDAP  03/11/2023   INFLUENZA VACCINE  Completed   Hepatitis C Screening  Completed   Zoster Vaccines- Shingrix  Completed   HPV VACCINES  Aged Out    Health Maintenance  Health Maintenance Due  Topic Date Due   Pneumonia Vaccine 12+ Years old (43 - PPSV23 if available, else PCV20) 06/30/2014   OPHTHALMOLOGY EXAM  04/08/2019   COVID-19 Vaccine (5 - Booster for Moderna series) 09/09/2020   COLONOSCOPY (Pts 45-88yrs Insurance coverage will need to be confirmed)  09/14/2020   URINE MICROALBUMIN  02/24/2021    Colorectal cancer screening: Referral to GI placed 12/29/20. Pt aware the office will call re: appt.  Additional Screening:  Hepatitis C Screening: Completed 02/07/16  Vision Screening: Recommended annual ophthalmology exams for early detection of glaucoma and other disorders of the eye. Is the patient up to date with their annual eye exam?  Yes  Who is the provider or what is the name of the office in which the patient attends annual eye exams? Dr Venetia Constable If pt is not established with a provider, would they like to be referred to a provider to establish care? No .   Dental Screening: Recommended annual dental exams for proper oral hygiene  Community Resource Referral / Chronic Care Management: CRR required this visit?  No   CCM required this visit?  No      Plan:     I have personally reviewed and noted the following in the patient's chart:   Medical and social history Use of alcohol, tobacco or illicit drugs  Current medications and supplements including opioid prescriptions. Patient is currently taking opioid prescriptions. Information provided to patient regarding non-opioid alternatives. Patient advised to discuss non-opioid treatment plan with their provider. Functional ability and status Nutritional status Physical activity Advanced directives List of  other physicians Hospitalizations, surgeries, and ER visits in previous 12 months Vitals Screenings to include cognitive, depression, and falls Referrals and appointments  In addition, I have reviewed and discussed with patient certain preventive protocols, quality metrics, and best practice recommendations. A written personalized care plan for preventive services as well as general preventive health recommendations were provided to patient.     Willette Brace, LPN   [id number]   Nurse Notes: None

## 2020-12-29 NOTE — Patient Instructions (Signed)
Chase Anderson , Thank you for taking time to come for your Medicare Wellness Visit. I appreciate your ongoing commitment to your health goals. Please review the following plan we discussed and let me know if I can assist you in the future.   Screening recommendations/referrals: Colonoscopy: Order placed 01/01/21 Recommended yearly ophthalmology/optometry visit for glaucoma screening and checkup Recommended yearly dental visit for hygiene and checkup  Vaccinations: Influenza vaccine: Done 12/07/20 repeat every year  Pneumococcal vaccine: Due  Tdap vaccine: Done 03/10/13 repeat every 10  years  Shingles vaccine: Completed 4/2 & 10/01/17   Covid-19: Completed 1/27, 2/24 & 01/15/20 & 07/15/20  Advanced directives: Advance directive discussed with you today. Even though you declined this today please call our office should you change your mind and we can give you the proper paperwork for you to fill out.  Conditions/risks identified: Get back to walking   Next appointment: Follow up in one year for your annual wellness visit.   Preventive Care 18 Years and Older, Male Preventive care refers to lifestyle choices and visits with your health care provider that can promote health and wellness. What does preventive care include? A yearly physical exam. This is also called an annual well check. Dental exams once or twice a year. Routine eye exams. Ask your health care provider how often you should have your eyes checked. Personal lifestyle choices, including: Daily care of your teeth and gums. Regular physical activity. Eating a healthy diet. Avoiding tobacco and drug use. Limiting alcohol use. Practicing safe sex. Taking low doses of aspirin every day. Taking vitamin and mineral supplements as recommended by your health care provider. What happens during an annual well check? The services and screenings done by your health care provider during your annual well check will depend on your age, overall  health, lifestyle risk factors, and family history of disease. Counseling  Your health care provider may ask you questions about your: Alcohol use. Tobacco use. Drug use. Emotional well-being. Home and relationship well-being. Sexual activity. Eating habits. History of falls. Memory and ability to understand (cognition). Work and work Statistician. Screening  You may have the following tests or measurements: Height, weight, and BMI. Blood pressure. Lipid and cholesterol levels. These may be checked every 5 years, or more frequently if you are over 74 years old. Skin check. Lung cancer screening. You may have this screening every year starting at age 82 if you have a 30-pack-year history of smoking and currently smoke or have quit within the past 15 years. Fecal occult blood test (FOBT) of the stool. You may have this test every year starting at age 104. Flexible sigmoidoscopy or colonoscopy. You may have a sigmoidoscopy every 5 years or a colonoscopy every 10 years starting at age 12. Prostate cancer screening. Recommendations will vary depending on your family history and other risks. Hepatitis C blood test. Hepatitis B blood test. Sexually transmitted disease (STD) testing. Diabetes screening. This is done by checking your blood sugar (glucose) after you have not eaten for a while (fasting). You may have this done every 1-3 years. Abdominal aortic aneurysm (AAA) screening. You may need this if you are a current or former smoker. Osteoporosis. You may be screened starting at age 72 if you are at high risk. Talk with your health care provider about your test results, treatment options, and if necessary, the need for more tests. Vaccines  Your health care provider may recommend certain vaccines, such as: Influenza vaccine. This is recommended every year.  Tetanus, diphtheria, and acellular pertussis (Tdap, Td) vaccine. You may need a Td booster every 10 years. Zoster vaccine. You may  need this after age 49. Pneumococcal 13-valent conjugate (PCV13) vaccine. One dose is recommended after age 40. Pneumococcal polysaccharide (PPSV23) vaccine. One dose is recommended after age 76. Talk to your health care provider about which screenings and vaccines you need and how often you need them. This information is not intended to replace advice given to you by your health care provider. Make sure you discuss any questions you have with your health care provider. Document Released: 03/24/2015 Document Revised: 11/15/2015 Document Reviewed: 12/27/2014 Elsevier Interactive Patient Education  2017 [employer] Prevention in the Home Falls can cause injuries. They can happen to people of all ages. There are many things you can do to make your home safe and to help prevent falls. What can I do on the outside of my home? Regularly fix the edges of walkways and driveways and fix any cracks. Remove anything that might make you trip as you walk through a door, such as a raised step or threshold. Trim any bushes or trees on the path to your home. Use bright outdoor lighting. Clear any walking paths of anything that might make someone trip, such as rocks or tools. Regularly check to see if handrails are loose or broken. Make sure that both sides of any steps have handrails. Any raised decks and porches should have guardrails on the edges. Have any leaves, snow, or ice cleared regularly. Use sand or salt on walking paths during winter. Clean up any spills in your garage right away. This includes oil or grease spills. What can I do in the bathroom? Use night lights. Install grab bars by the toilet and in the tub and shower. Do not use towel bars as grab bars. Use non-skid mats or decals in the tub or shower. If you need to sit down in the shower, use a plastic, non-slip stool. Keep the floor dry. Clean up any water that spills on the floor as soon as it happens. Remove soap buildup in  the tub or shower regularly. Attach bath mats securely with double-sided non-slip rug tape. Do not have throw rugs and other things on the floor that can make you trip. What can I do in the bedroom? Use night lights. Make sure that you have a light by your bed that is easy to reach. Do not use any sheets or blankets that are too big for your bed. They should not hang down onto the floor. Have a firm chair that has side arms. You can use this for support while you get dressed. Do not have throw rugs and other things on the floor that can make you trip. What can I do in the kitchen? Clean up any spills right away. Avoid walking on wet floors. Keep items that you use a lot in easy-to-reach places. If you need to reach something above you, use a strong step stool that has a grab bar. Keep electrical cords out of the way. Do not use floor polish or wax that makes floors slippery. If you must use wax, use non-skid floor wax. Do not have throw rugs and other things on the floor that can make you trip. What can I do with my stairs? Do not leave any items on the stairs. Make sure that there are handrails on both sides of the stairs and use them. Fix handrails that are broken or loose.  Make sure that handrails are as long as the stairways. Check any carpeting to make sure that it is firmly attached to the stairs. Fix any carpet that is loose or worn. Avoid having throw rugs at the top or bottom of the stairs. If you do have throw rugs, attach them to the floor with carpet tape. Make sure that you have a light switch at the top of the stairs and the bottom of the stairs. If you do not have them, ask someone to add them for you. What else can I do to help prevent falls? Wear shoes that: Do not have high heels. Have rubber bottoms. Are comfortable and fit you well. Are closed at the toe. Do not wear sandals. If you use a stepladder: Make sure that it is fully opened. Do not climb a closed  stepladder. Make sure that both sides of the stepladder are locked into place. Ask someone to hold it for you, if possible. Clearly mark and make sure that you can see: Any grab bars or handrails. First and last steps. Where the edge of each step is. Use tools that help you move around (mobility aids) if they are needed. These include: Canes. Walkers. Scooters. Crutches. Turn on the lights when you go into a dark area. Replace any light bulbs as soon as they burn out. Set up your furniture so you have a clear path. Avoid moving your furniture around. If any of your floors are uneven, fix them. If there are any pets around you, be aware of where they are. Review your medicines with your doctor. Some medicines can make you feel dizzy. This can increase your chance of falling. Ask your doctor what other things that you can do to help prevent falls. This information is not intended to replace advice given to you by your health care provider. Make sure you discuss any questions you have with your health care provider. Document Released: 12/22/2008 Document Revised: 08/03/2015 Document Reviewed: 04/01/2014 Elsevier Interactive Patient Education  2017 [employer].

## 2021-01-17 DIAGNOSIS — L4050 Arthropathic psoriasis, unspecified: Secondary | ICD-10-CM | POA: Diagnosis not present

## 2021-01-25 ENCOUNTER — Other Ambulatory Visit: Payer: Self-pay | Admitting: Family Medicine

## 2021-02-05 ENCOUNTER — Other Ambulatory Visit: Payer: Self-pay | Admitting: Family Medicine

## 2021-02-13 ENCOUNTER — Telehealth: Payer: Self-pay

## 2021-02-13 NOTE — Telephone Encounter (Signed)
Pt's wife called stating that she is concerned about Chase Anderson. She stated she believes that he is having hallucinations. She stated that he has been seeing bugs or animals in the house and there is nothing there. She stated that his B12 was low at the last visit with Dr Yong Channel. She stated that he has been taking B12 vitamins everyday but she doesn't believe they are helping. She stated that she is unsure of what to do and wants Dr Yong Channel to know. She would like a call back. Please Advise.

## 2021-02-13 NOTE — Telephone Encounter (Signed)
Can pt be worked in? Dr Ansel Bong next available is 03/21/21. Please Advise.

## 2021-02-13 NOTE — Telephone Encounter (Signed)
Please schedule visit to discuss.

## 2021-02-13 NOTE — Telephone Encounter (Signed)
SDA is fine.

## 2021-02-13 NOTE — Telephone Encounter (Signed)
Pt is scheduled °

## 2021-02-15 ENCOUNTER — Encounter: Payer: Self-pay | Admitting: Family Medicine

## 2021-02-15 ENCOUNTER — Other Ambulatory Visit: Payer: Self-pay

## 2021-02-15 ENCOUNTER — Ambulatory Visit: Payer: Medicare PPO | Admitting: Family Medicine

## 2021-02-15 VITALS — BP 130/60 | HR 75 | Temp 97.8°F | Ht 70.0 in | Wt 179.1 lb

## 2021-02-15 DIAGNOSIS — E785 Hyperlipidemia, unspecified: Secondary | ICD-10-CM

## 2021-02-15 DIAGNOSIS — E11319 Type 2 diabetes mellitus with unspecified diabetic retinopathy without macular edema: Secondary | ICD-10-CM | POA: Diagnosis not present

## 2021-02-15 DIAGNOSIS — L4050 Arthropathic psoriasis, unspecified: Secondary | ICD-10-CM

## 2021-02-15 DIAGNOSIS — R443 Hallucinations, unspecified: Secondary | ICD-10-CM

## 2021-02-15 DIAGNOSIS — E1159 Type 2 diabetes mellitus with other circulatory complications: Secondary | ICD-10-CM | POA: Diagnosis not present

## 2021-02-15 DIAGNOSIS — I152 Hypertension secondary to endocrine disorders: Secondary | ICD-10-CM

## 2021-02-15 DIAGNOSIS — F3342 Major depressive disorder, recurrent, in full remission: Secondary | ICD-10-CM

## 2021-02-15 DIAGNOSIS — N183 Chronic kidney disease, stage 3 unspecified: Secondary | ICD-10-CM | POA: Diagnosis not present

## 2021-02-15 DIAGNOSIS — Z79899 Other long term (current) drug therapy: Secondary | ICD-10-CM

## 2021-02-15 DIAGNOSIS — E1169 Type 2 diabetes mellitus with other specified complication: Secondary | ICD-10-CM

## 2021-02-15 DIAGNOSIS — D75839 Thrombocytosis, unspecified: Secondary | ICD-10-CM

## 2021-02-15 MED ORDER — FAMOTIDINE 20 MG PO TABS: 20.0000 mg | ORAL_TABLET | Freq: Two times a day (BID) | ORAL | 11 refills | Status: DC | PRN

## 2021-02-15 MED ORDER — BUPROPION HCL 75 MG PO TABS: 37.5000 mg | ORAL_TABLET | Freq: Two times a day (BID) | ORAL | 3 refills | Status: DC

## 2021-02-15 NOTE — Progress Notes (Signed)
Phone [phone] In person visit   Subjective:   Chase Anderson. is a 75 y.o. year old very pleasant male patient who presents for/with See problem oriented charting Chief Complaint  Patient presents with   Hallucinations    Started about 3 months ago Possibly due to B 12   Numbness    Numbness and tingling down arms and hands that started 3 months ago     This visit occurred during the SARS-CoV-2 public health emergency.  Safety protocols were in place, including screening questions prior to the visit, additional usage of staff PPE, and extensive cleaning of exam room while observing appropriate contact time as indicated for disinfecting solutions.   Past Medical History-  Patient Active Problem List   Diagnosis Date Noted   Thrombocytosis 04/03/2016    Priority: High   Depression 03/27/2015    Priority: High   Psoriatic arthritis ([place]) 01/25/2013    Priority: High   Mitral valve prolapse 12/25/2010    Priority: High   Leukocytopenia 11/24/2006    Priority: High   Controlled type 2 diabetes mellitus with ophthalmic complication ([place]) 30/86/5784    Priority: High   CAD (coronary artery disease) with history MI 2004- no intervention 10/06/2006    Priority: High   Mitral valve regurgitation 02/15/2019    Priority: Medium    Dyshidrotic eczema 02/07/2016    Priority: Medium    CKD (chronic kidney disease), stage III ([place]) 09/15/2015    Priority: Medium    History of BPH 12/14/2009    Priority: Medium    Hyperlipidemia associated with type 2 diabetes mellitus ([place]) 10/06/2006    Priority: Medium    Hypertension associated with diabetes ([place]) 10/06/2006    Priority: Medium    GERD (gastroesophageal reflux disease) 08/24/2019    Priority: Low   Diabetic retinopathy ([place]) 02/20/2017    Priority: Low   ERECTILE DYSFUNCTION 11/26/2007    Priority: Low   Candidiasis 11/25/2012   Immunodeficiency disorder with predominant T-cell defect ([employer]) 11/19/2012     Medications- reviewed and updated Current Outpatient Medications  Medication Sig Dispense Refill   ACCU-CHEK AVIVA PLUS test strip USE TO TEST BLOOD SUGAR EVERY DAY 100 strip 1   amLODipine (NORVASC) 10 MG tablet TAKE 1 TABLET BY MOUTH EVERY DAY 100 tablet 3   aspirin EC 81 MG tablet Take 81 mg by mouth daily.     buPROPion (WELLBUTRIN) 75 MG tablet Take 0.5 tablets (37.5 mg total) by mouth 2 (two) times daily. 90 tablet 3   clindamycin (CLEOCIN) 300 MG capsule Take 300 mg by mouth as needed.      fluconazole (DIFLUCAN) 100 MG tablet Take 100 mg by mouth daily as needed.      fluocinonide ointment (LIDEX) 0.05 % APPLY TOPICALLY ONCE DAILY AS NEEDED 60 g 3   glimepiride (AMARYL) 4 MG tablet Take 1 tablet (4 mg total) by mouth daily with breakfast. PLEASE SCHEDULE APPT WITH DR. Reshard Guillet FOR FURTHER REFILLS [phone] 90 tablet 0   glucose blood (ACCU-CHEK AVIVA PLUS) test strip      glucose blood (ACCU-CHEK AVIVA PLUS) test strip USE TO TEST BLOOD SUGAR ONCE DAILY     InFLIXimab (REMICADE IV) Inject into the vein every 8 (eight) weeks.      JANUVIA 50 MG tablet TAKE 1 TABLET BY MOUTH DAILY 30 tablet 0   metFORMIN (GLUCOPHAGE-XR) 500 MG 24 hr tablet TAKE 2 TABLETS BY MOUTH DAILY WITH BREAKFAST 180 tablet 3   metoprolol tartrate (  LOPRESSOR) 25 MG tablet TAKE 1 TABLET(25 MG) BY MOUTH TWICE DAILY 180 tablet 1   nitroGLYCERIN (NITROSTAT) 0.4 MG SL tablet Place 0.4 mg under the tongue every 5 (five) minutes as needed for chest pain. Up to three time and then call the doctor     simvastatin (ZOCOR) 20 MG tablet TAKE 1 TABLET(20 MG) BY MOUTH AT BEDTIME 90 tablet 1   sulfamethoxazole-trimethoprim (BACTRIM DS,SEPTRA DS) 800-160 MG tablet Take 1 tablet by mouth every Monday, Wednesday, and Friday.  5   tadalafil (CIALIS) 20 MG tablet Take 1 tablet (20 mg total) by mouth daily as needed for erectile dysfunction (no nitroglycerin within 72 hours). 10 tablet 5   traMADol (ULTRAM) 50 MG tablet TAKE 1  TABLET BY MOUTH TWICE DAILY AS NEEDED 60 tablet 5   valACYclovir (VALTREX) 500 MG tablet Take 1 tablet (500 mg total) by mouth as needed. (Patient taking differently: Take 500 mg by mouth 3 (three) times daily.) 60 tablet 3   Cyanocobalamin (B-12) 1000 MCG CAPS  (Patient not taking: Reported on 02/15/2021)     famotidine (PEPCID) 20 MG tablet Take 1 tablet (20 mg total) by mouth 2 (two) times daily as needed for heartburn or indigestion. 60 tablet 11   No current facility-administered medications for this visit.     Objective:  BP 130/60   Pulse 75   Temp 97.8 F (36.6 C) (Temporal)   Ht 5\' 10"  (1.778 m)   Wt 179 lb 2 oz (81.3 kg)   SpO2 98%   BMI 25.70 kg/m  Gen: NAD, resting comfortably CV: RRR no murmurs rubs or gallops Lungs: CTAB no crackles, wheeze, rhonchi Ext: no edema Skin: warm, dry  Neuro: CN II-XII intact, sensation and reflexes normal throughout, 5/5 muscle strength in bilateral upper and lower extremities. Normal finger to nose. Normal rapid alternating movements. No pronator drift. Normal romberg. Normal gait.   Mini cog normal with normal clock draw and 2 out of 3 recall No cogwheel rigidity or masked facies    Assessment and Plan   # Hallucinations primarily at night every few weeks S: Symptoms started 3 months ago.  Patient reports infrequent hallucinations mainly at night intermittent- people outside that are not there (more shadows), objects on curtain that are not there- sometimes shadow sometimes more specific images, but also has seen specific images like spiders or bobcat.  He woke wife up at 3 am saying "where did it go, the spider"?.  States has seen animals such as a bobcat- not just a shape. Sometimes has not even fallen asleep when this happens.   Started initially when having dental issues including 3 abscesses. . Dental issues started in august- was on antibiotics for a while and now off. He denies issues prior to dental issues. Dental issues have been  resolved though. Has also had 2 falls on same night but when dealing with dental issues but none since that time.   He is worried about b12 levels. B12 was 212 (potentially low range) He started b12 1000 mcg daily b12. Has also had 2 falls on same night but when dealing with dental issues.   Just had eyes examined and new glasses - has seen the spider image since then. Did not specifically mention to optho though.   Does not drink alcohol and does not do any drugs. No depression or history psychiatric disease. He is ok with drug screen.   Some changes to memory but not severe. No headaches around episodes  A/P: 75 year old male with multiple medical issues presenting with new onset intermittent hallucinations-some of these images appear complex-over the last 3 months started around the time of multiple dental procedures but persisting beyond the dental issues. - He is primarily concerned about low B12 and we will update a B12 level and if has not had sufficient elevations on oral B12 could consider injections - Also check CBC and CMP - Reassuring neurological examination today - Patient with complex medical history and has had some atypical disease such as psoriatic arthritis, unexplained leukocytopenia, thrombocytosis possibly related to psoriatic arthritis-I certainly have a lower threshold to investigate more aggressively in him and we opted to do an MRI of his brain as a result - Strongly considering neurology visit as well -Review of medication after visit and Medication with potential hallucination correlation would be Wellbutrin (trial down 75 mg half tablet twice daily and see how he does) and metoprolol (recommend half tablet twice daily in case dream related) and bactrim (will hold off on changing unless advised by ID Dr. Frutoso Chase  and tramadol and valtrex (does not take) -issues always around bedttime and usually after laying down- wonder if these could be dream related- reducing metoprolol  as result - no obvious signs of parkinsons and mini cog normal- doubt dementia -sundowning possibly? Mild intermittent delirium/confusion as result? That improves with rest   #Psoriatic arthritis-continues on Remicade gets labs every 16 weeks through Dr. Amil Amen.  Follows closely with rheumatology.  I did prescribe his tramadol as needed in past- now through rheumatology -Also [facility] by [facility] infectious disease for immunodeficiency-on Bactrim 3 days a week. As needed clindamycin as needed as well as Diflucan. Overall stable- continue current meds and [facility] follow up - he feels like not working as well- plans to discuss with rheumatology  #CAD #hyperlipidemia S: Medication:compliant with aspirin and simvastatin 20 mg - follows with cardiology -Nitroglycerin on hand but does not have to use  No CP or SOB  -Patient is on metoprolol as well. Will reduce dose Lab Results  Component Value Date   CHOL 90 02/25/2020   HDL 32 (L) 02/25/2020   LDLCALC 41 02/25/2020   TRIG 90 02/25/2020   CHOLHDL 2.8 02/25/2020   A/P: For CAD-asymptomatic-continue current medications For lipids-cholesterol well controlled-continue current medications  #hypertension S: medication:  Amlodipine 10 mg daily, metoprolol 25 mg twice daily BP Readings from Last 3 Encounters:  02/15/21 130/60  12/07/20 130/60  06/08/20 136/72  A/P: Blood pressure well controlled we will continue amlodipine and will reduce metoprolol to half tablet twice daily in case symptoms vivid dream related  #Chronic kidney disease stage III #hyperkalemia S: GFR is typically in the 40s range typically -Patient knows to avoid NSAIDs  -Has not tolerated ACE inhibitor in the past.  Would consider ARB if needed but dont think potassium would tolerate - Bactrim could have contributed but patient needs this - considered nephrology opinion if potassium gets high A/P: hopefully stable- update CMP   # Depression S: Medication:Bupropion 150 mg twice  daily  Depression screen [facility] 2/9 02/15/2021 12/29/2020 12/07/2020  Decreased Interest 0 0 0  Down, Depressed, Hopeless 0 0 0  PHQ - 2 Score 0 0 0  Altered sleeping 1 - 0  Tired, decreased energy 1 - 3  Change in appetite 0 - 0  Feeling bad or failure about yourself  0 - 0  Trouble concentrating 0 - 0  Moving slowly or fidgety/restless 0 - 0  Suicidal thoughts 0 -  0  PHQ-9 Score 2 - 3  Difficult doing work/chores Not difficult at all - Not difficult at all  Some recent data might be hidden  A/P: hopefully he will tolerate lower dose- could be related to hallucinations- will try 37.5 mg IR twice daily   # Diabetes- using A1c goal of 7.5 or even 8 S: Medication:Amaryl 4 mg daily, Januvia 50 mg, Metformin 500 mg twice daily   Lab Results  Component Value Date   HGBA1C 7.3 (H) 12/07/2020   HGBA1C 6.7 (H) 02/25/2020   HGBA1C 7.9 (H) 08/24/2019  A/P: mild poor control last check- too soon for recheck- continue current meds  # B12 deficiency S: Current treatment/medication (oral vs. IM): b12 oral 1000 mcg  Lab Results  Component Value Date   VITAMINB12 212 12/07/2020  A/P: check b12 - if low may need injections instead   Recommended follow up: No follow-ups on file. Future Appointments  Date Time Provider [place]  06/13/2021  8:20 AM Marin Olp, MD [facility]  01/11/2022  8:00 AM [facility] HEALTH COACH [facility]    Lab/Order associations:   ICD-10-CM   1. Hallucinations  R44.3 Vitamin B12    DRUG MONITORING, PANEL 8 WITH CONFIRMATION, URINE    MR Brain Wo Contrast    CBC with Differential/Platelet    Comprehensive metabolic panel    2. Hyperlipidemia associated with type 2 diabetes mellitus ([place])  E11.69    E78.5     3. Controlled type 2 diabetes mellitus with retinopathy, without long-term current use of insulin, macular edema presence unspecified, unspecified laterality, unspecified retinopathy severity ([place])  E11.319     4. Hypertension associated with  diabetes ([place])  E11.59    I15.2     5. Stage 3 chronic kidney disease, unspecified whether stage 3a or 3b CKD (H[facility])  N18.30     6. Recurrent major depressive disorder, in full remission ([place])  F33.42     7. Psoriatic arthritis ([place])  L40.50     8. Thrombocytosis  D75.839     9. High risk medication use  Z79.899 Vitamin B12      Meds ordered this encounter  Medications   famotidine (PEPCID) 20 MG tablet    Sig: Take 1 tablet (20 mg total) by mouth 2 (two) times daily as needed for heartburn or indigestion.    Dispense:  60 tablet    Refill:  11   buPROPion (WELLBUTRIN) 75 MG tablet    Sig: Take 0.5 tablets (37.5 mg total) by mouth 2 (two) times daily.    Dispense:  90 tablet    Refill:  3    Time Spent: 53 minutes of total time (4:10 PM- 5:03 PM) was spent on the date of the encounter performing the following actions: chart review prior to seeing the patient, obtaining history, performing a medically necessary exam, counseling on the treatment plan, placing orders, and documenting in our EHR.   I,Harris Phan,acting as a Education administrator for Garret Reddish, MD.,have documented all relevant documentation on the behalf of Garret Reddish, MD,as directed by  Garret Reddish, MD while in the presence of Garret Reddish, MD.    I, Garret Reddish, MD, have reviewed all documentation for this visit. The documentation on 02/15/21 for the exam, diagnosis, procedures, and orders are all accurate and complete.   Return precautions advised.  Garret Reddish, MD

## 2021-02-15 NOTE — Patient Instructions (Addendum)
Health Maintenance Due  Topic Date Due   OPHTHALMOLOGY EXAM  -Sign release of information at the check out desk for last diabetic eye exam.  04/08/2019   COVID-19 Vaccine (5 - Booster for Moderna series) - Recommend bivalent booster at pharmacy  09/09/2020   COLONOSCOPY   You still need to call to get colonoscopy done   09/14/2020   Please stop by lab before you go If you have mychart- we will send your results within 3 business days of [place] receiving them.  If you do not have mychart- we will call you about results within 5 business days of [place] receiving them.  *please also note that you will see labs on mychart as soon as they post. I will later go in and write notes on them- will say "notes from Dr. Yong Channel"  We will call you within two weeks about your referral to MRI. If you do not hear within 2 weeks, give [place] a call.   Please let me know if you experience any new or worsening symptoms in regards to your hallucinations - a Neurology referral may be the next step.  Recommended follow up: keep April visit or sooner if we do not find underlying cause with labs and mri- also considering neurology visit heavily

## 2021-02-16 LAB — DRUG MONITORING, PANEL 8 WITH CONFIRMATION, URINE
6 Acetylmorphine: NEGATIVE ng/mL (ref <10)
Alcohol Metabolites: NEGATIVE ng/mL (ref <500)
Amphetamines: NEGATIVE ng/mL (ref <500)
Benzodiazepines: NEGATIVE ng/mL (ref <100)
Buprenorphine, Urine: NEGATIVE ng/mL (ref <5)
Cocaine Metabolite: NEGATIVE ng/mL (ref <150)
Creatinine: 107.7 mg/dL (ref >=20.0)
MDMA: NEGATIVE ng/mL (ref <500)
Marijuana Metabolite: NEGATIVE ng/mL (ref <20)
Opiates: NEGATIVE ng/mL (ref <100)
Oxidant: NEGATIVE ug/mL (ref <200)
Oxycodone: NEGATIVE ng/mL (ref <100)
pH: 5.7 (ref 4.5–9.0)

## 2021-02-16 LAB — CBC WITH DIFFERENTIAL/PLATELET
Basophils Absolute: 0.1 10*3/uL (ref 0.0–0.1)
Basophils Relative: 1.2 % (ref 0.0–3.0)
Eosinophils Absolute: 0.4 10*3/uL (ref 0.0–0.7)
Eosinophils Relative: 4.5 % (ref 0.0–5.0)
HCT: 43 % (ref 39.0–52.0)
Hemoglobin: 14.3 g/dL (ref 13.0–17.0)
Lymphocytes Relative: 9.9 % — ABNORMAL LOW (ref 12.0–46.0)
Lymphs Abs: 1 10*3/uL (ref 0.7–4.0)
MCHC: 33.3 g/dL (ref 30.0–36.0)
MCV: 86.5 fl (ref 78.0–100.0)
Monocytes Absolute: 0.8 10*3/uL (ref 0.1–1.0)
Monocytes Relative: 7.8 % (ref 3.0–12.0)
Neutro Abs: 7.4 10*3/uL (ref 1.4–7.7)
Neutrophils Relative %: 76.6 % (ref 43.0–77.0)
Platelets: 1015 10*3/uL (ref 150.0–400.0)
RBC: 4.97 Mil/uL (ref 4.22–5.81)
RDW: 14.6 % (ref 11.5–15.5)
WBC: 9.7 10*3/uL (ref 4.0–10.5)

## 2021-02-16 LAB — COMPREHENSIVE METABOLIC PANEL
ALT: 23 U/L (ref 0–53)
AST: 24 U/L (ref 0–37)
Albumin: 4.5 g/dL (ref 3.5–5.2)
Alkaline Phosphatase: 45 U/L (ref 39–117)
BUN: 24 mg/dL — ABNORMAL HIGH (ref 6–23)
CO2: 24 mEq/L (ref 19–32)
Calcium: 9.7 mg/dL (ref 8.4–10.5)
Chloride: 104 mEq/L (ref 96–112)
Creatinine, Ser: 1.81 mg/dL — ABNORMAL HIGH (ref 0.40–1.50)
GFR: 36 mL/min — ABNORMAL LOW (ref >60.00)
Glucose, Bld: 179 mg/dL — ABNORMAL HIGH (ref 70–99)
Potassium: 5.2 mEq/L — ABNORMAL HIGH (ref 3.5–5.1)
Sodium: 135 mEq/L (ref 135–145)
Total Bilirubin: 0.3 mg/dL (ref 0.2–1.2)
Total Protein: 6.8 g/dL (ref 6.0–8.3)

## 2021-02-16 LAB — DM TEMPLATE

## 2021-02-16 LAB — VITAMIN B12: Vitamin B-12: 893 pg/mL (ref 211–911)

## 2021-02-24 ENCOUNTER — Other Ambulatory Visit: Payer: Self-pay | Admitting: Family Medicine

## 2021-03-15 DIAGNOSIS — L4050 Arthropathic psoriasis, unspecified: Secondary | ICD-10-CM | POA: Diagnosis not present

## 2021-03-19 ENCOUNTER — Other Ambulatory Visit: Payer: Self-pay | Admitting: Family Medicine

## 2021-03-19 DIAGNOSIS — E7800 Pure hypercholesterolemia, unspecified: Secondary | ICD-10-CM

## 2021-03-26 ENCOUNTER — Other Ambulatory Visit: Payer: Self-pay | Admitting: Family Medicine

## 2021-04-04 DIAGNOSIS — Z5181 Encounter for therapeutic drug level monitoring: Secondary | ICD-10-CM | POA: Diagnosis not present

## 2021-04-04 DIAGNOSIS — D75839 Thrombocytosis, unspecified: Secondary | ICD-10-CM | POA: Diagnosis not present

## 2021-04-04 DIAGNOSIS — Z9189 Other specified personal risk factors, not elsewhere classified: Secondary | ICD-10-CM | POA: Diagnosis not present

## 2021-04-04 DIAGNOSIS — D849 Immunodeficiency, unspecified: Secondary | ICD-10-CM | POA: Diagnosis not present

## 2021-04-04 DIAGNOSIS — Z880 Allergy status to penicillin: Secondary | ICD-10-CM | POA: Diagnosis not present

## 2021-04-28 ENCOUNTER — Other Ambulatory Visit: Payer: Self-pay | Admitting: Cardiology

## 2021-05-11 ENCOUNTER — Other Ambulatory Visit: Payer: Self-pay | Admitting: Family Medicine

## 2021-05-11 DIAGNOSIS — L4050 Arthropathic psoriasis, unspecified: Secondary | ICD-10-CM | POA: Diagnosis not present

## 2021-05-16 NOTE — Progress Notes (Incomplete)
Phone: [phone]   Subjective:  Patient presents today for their annual physical. Chief complaint-noted.   See problem oriented charting- ROS- full  review of systems was completed and negative  except for: ***  The following were reviewed and entered/updated in epic: Past Medical History:  Diagnosis Date   Allergic rhinitis    Anxiety    Atopic dermatitis    CAD (coronary artery disease)    Mild at cardiac catheterization 2004   CKD (chronic kidney disease) stage 3, GFR 30-59 ml/min (HCC)    Collagen vascular disease ([place])    Erectile dysfunction    Essential hypertension    GERD (gastroesophageal reflux disease)    Hyperlipidemia    Myocardial infarction ([place]) 2004   Suspected plaque rupture/aborted inferior infarct 2004, nonobstructive CAD managed medically   Osteoarthritis    Type 2 diabetes mellitus ([place])    Patient Active Problem List   Diagnosis Date Noted   GERD (gastroesophageal reflux disease) 08/24/2019   Mitral valve regurgitation 02/15/2019   Diabetic retinopathy ([place]) 02/20/2017   Thrombocytosis 04/03/2016   Dyshidrotic eczema 02/07/2016   CKD (chronic kidney disease), stage III ([place]) 09/15/2015   Depression 03/27/2015   Psoriatic arthritis ([place]) 01/25/2013   Candidiasis 11/25/2012   Immunodeficiency disorder with predominant T-cell defect ([place]) 11/19/2012   Mitral valve prolapse 12/25/2010   History of BPH 12/14/2009   ERECTILE DYSFUNCTION 11/26/2007   Leukocytopenia 11/24/2006   Controlled type 2 diabetes mellitus with ophthalmic complication ([place]) 50/53/9767   Hyperlipidemia associated with type 2 diabetes mellitus ([place]) 10/06/2006   Hypertension associated with diabetes ([place]) 10/06/2006   CAD (coronary artery disease) with history MI 2004- no intervention 10/06/2006   Past Surgical History:  Procedure Laterality Date   COLONOSCOPY     ESOPHAGOGASTRODUODENOSCOPY  04/23/2005   KNEE ARTHROSCOPY     right   POLYPECTOMY  2008   TA x 4     TONSILLECTOMY     TOOTH EXTRACTION     x 4 removed    UPPER GASTROINTESTINAL ENDOSCOPY      Family History  Problem Relation Age of Onset   Dementia Mother    Other Mother        abdominal issues led to death- apparently inoperable growth-  likely cancer   COPD Father        led to death   Colon polyps Father    Diabetes Brother    Colon cancer Neg Hx    Esophageal cancer Neg Hx    Rectal cancer Neg Hx    Stomach cancer Neg Hx     Medications- reviewed and updated Current Outpatient Medications  Medication Sig Dispense Refill   ACCU-CHEK AVIVA PLUS test strip USE TO TEST BLOOD SUGAR EVERY DAY 100 strip 1   amLODipine (NORVASC) 10 MG tablet TAKE 1 TABLET BY MOUTH EVERY DAY 100 tablet 3   aspirin EC 81 MG tablet Take 81 mg by mouth daily.     buPROPion (WELLBUTRIN) 75 MG tablet Take 0.5 tablets (37.5 mg total) by mouth 2 (two) times daily. 90 tablet 3   clindamycin (CLEOCIN) 300 MG capsule Take 300 mg by mouth as needed.      Cyanocobalamin (B-12) 1000 MCG CAPS  (Patient not taking: Reported on 02/15/2021)     famotidine (PEPCID) 20 MG tablet Take 1 tablet (20 mg total) by mouth 2 (two) times daily as needed for heartburn or indigestion. 60 tablet 11   fluconazole (DIFLUCAN) 100 MG tablet Take 100 mg  by mouth daily as needed.      fluocinonide ointment (LIDEX) 0.05 % APPLY TOPICALLY ONCE DAILY AS NEEDED 60 g 3   glimepiride (AMARYL) 4 MG tablet TAKE 1 TABLET(4 MG) BY MOUTH DAILY WITH BREAKFAST 90 tablet 0   glucose blood (ACCU-CHEK AVIVA PLUS) test strip      glucose blood (ACCU-CHEK AVIVA PLUS) test strip USE TO TEST BLOOD SUGAR ONCE DAILY     InFLIXimab (REMICADE IV) Inject into the vein every 8 (eight) weeks.      JANUVIA 50 MG tablet TAKE 1 TABLET BY MOUTH DAILY 30 tablet 0   metFORMIN (GLUCOPHAGE-XR) 500 MG 24 hr tablet TAKE 2 TABLETS BY MOUTH DAILY WITH BREAKFAST 180 tablet 3   metoprolol tartrate (LOPRESSOR) 25 MG tablet TAKE 1 TABLET(25 MG) BY MOUTH TWICE DAILY 180 tablet  1   nitroGLYCERIN (NITROSTAT) 0.4 MG SL tablet Place 0.4 mg under the tongue every 5 (five) minutes as needed for chest pain. Up to three time and then call the doctor     simvastatin (ZOCOR) 20 MG tablet TAKE 1 TABLET(20 MG) BY MOUTH AT BEDTIME 90 tablet 1   sulfamethoxazole-trimethoprim (BACTRIM DS,SEPTRA DS) 800-160 MG tablet Take 1 tablet by mouth every Monday, Wednesday, and Friday.  5   tadalafil (CIALIS) 20 MG tablet Take 1 tablet (20 mg total) by mouth daily as needed for erectile dysfunction (no nitroglycerin within 72 hours). 10 tablet 5   traMADol (ULTRAM) 50 MG tablet TAKE 1 TABLET BY MOUTH TWICE DAILY AS NEEDED 60 tablet 5   valACYclovir (VALTREX) 500 MG tablet Take 1 tablet (500 mg total) by mouth as needed. (Patient taking differently: Take 500 mg by mouth 3 (three) times daily.) 60 tablet 3   No current facility-administered medications for this visit.    Allergies-reviewed and updated Allergies  Allergen Reactions   Amoxicillin     Significant diffuse skin rash/irritation   Codeine     REACTION: itching and rash Other reaction(s): rash   Lisinopril    Methotrexate Other (See Comments)    Other reaction(s): mouth sores   Penicillin G     Other reaction(s): rash   Penicillins     REACTION: as a child   Pristiq [Desvenlafaxine Succinate Er]     "funny feeling"    Social History   Social History Narrative   Married. 2 children. 2 stepchildren. 7 grandkids including step   Take care of daughter of law Kyrie Fludd- son left her with 23 year old.       Retired after  9 years [employer] [employer]      Hobbies: golfer, gardening   Objective  Objective:  There were no vitals taken for this visit. Gen: NAD, resting comfortably HEENT: Mucous membranes are moist. Oropharynx normal Neck: no thyromegaly CV: RRR no murmurs rubs or gallops Lungs: CTAB no crackles, wheeze, rhonchi Abdomen: soft/nontender/nondistended/normal bowel sounds. No rebound or guarding.  Ext: no  edema Skin: warm, dry Neuro: grossly normal, moves all extremities, PERRLA ***   Assessment and Plan  76 y.o. male presenting for annual physical.  Health Maintenance counseling: 1. Anticipatory guidance: Patient counseled regarding regular dental exams ***q6 months, eye exams -yearly with known retinopathy***,  avoiding smoking and second hand smoke*** , limiting alcohol to 2 beverages per day -does not drink at all.  ***.   2. Risk factor reduction:  Advised patient of need for regular exercise and diet rich and fruits and vegetables to reduce risk of heart attack and  stroke.  Exercise-walking 2 miles daily with his border collie ***.  Diet/weight management-slightly looser with holidays- feels he could tighten diet some for his diabetes. ***.  Wt Readings from Last 3 Encounters:  02/15/21 179 lb 2 oz (81.3 kg)  12/07/20 182 lb (82.6 kg)  06/08/20 188 lb 3.2 oz (85.4 kg)   3. Immunizations/screenings/ancillary studies DISCUSSED:  -COVID booster vaccination #5- *** -Prevnar-20 vaccination - *** -Vision Exam- *** -Foot Exam - *** Immunization History  Administered Date(s) Administered   Fluad Quad(high Dose 65+) 11/23/2018, 12/07/2020   Influenza Split 01/23/2012, 11/19/2012   Influenza Whole 11/26/2007, 12/14/2009   Influenza, High Dose Seasonal PF 02/01/2014, 02/06/2015, 02/07/2016, 12/06/2016   Influenza, Seasonal, Injecte, Preservative Fre 01/05/2009   Influenza,inj,Quad PF,6+ Mos 11/19/2012, 12/30/2019   Influenza-Unspecified 01/05/2009, 02/01/2014, 02/06/2015, 02/07/2016, 12/06/2016, 11/27/2017, 11/23/2018   Moderna Sars-Covid-2 Vaccination 04/07/2019, 05/05/2019, 01/15/2020, 07/15/2020   Pneumococcal Conjugate-13 06/02/2013   Pneumococcal Polysaccharide-23 03/11/2004, 06/29/2009   Td 03/11/2002   Tetanus 03/10/2013   Zoster Recombinat (Shingrix) 06/10/2017, 10/01/2017   Health Maintenance Due  Topic Date Due   Pneumonia Vaccine 36+ Years old (3) 06/30/2014    OPHTHALMOLOGY EXAM  04/08/2019   COVID-19 Vaccine (5 - Booster for Moderna series) 09/09/2020   COLONOSCOPY (Pts 45-26yr Insurance coverage will need to be confirmed)  09/14/2020   FOOT EXAM  02/24/2021   URINE MICROALBUMIN  02/24/2021   4. Prostate cancer screening-  past age based screening recommendations.  Nocturia twice a night stable for years.***  Lab Results  Component Value Date   PSA 0.67 02/07/2016   PSA 0.54 02/06/2015   PSA 0.56 02/01/2014   5. Colon cancer screening - 09/15/19 with 1 year repeat planned*** 6. Skin cancer screening- no dermatologist- Dr. HNevada Cranein the past***advised regular sunscreen use. Denies worrisome, changing, or new skin lesions.  7. Smoking associated screening (lung cancer screening, AAA screen 65-75, UA)- former smoker- 60 pack years but quit in 2003-2005.  CT scan of abdomen without obvious AAA.  Will get urine with labs.  Quit over 15 years ago so will not qualify for lung cancer screening*** 8. STD screening - only active with wife so declines***  Status of chronic or acute concerns   ***12/24/2019 awv ***02/25/2020 CPE  2021***12 adenomas- 1 year colonoscopy repeat  call 04/04/21-Dr. FPhilis Kendall less worried about thrombocytosis over 1k. can do e consult for oncology if over 1k again -wants him to have prevnar 20***  -polypharmacy ? for hallucinations- better at that visit  # Hallucinations primarily at night every few weeks S: Symptoms started September 2022 Patient reportedinfrequent hallucinations mainly at night intermittent- people outside that are not there (more shadows), objects on curtain that are not there- sometimes shadow sometimes more specific images, but also had seen specific images like spiders or bobcat.  He woke wife up at 3 am saying "where did it go, the spider"?.  Stated had seen animals such as a bobcat- not just a shape. Sometimes had not even fallen asleep when this happens.    Started initially when having dental issues  including 3 abscesses. . Dental issues started in august- was on antibiotics for a while and now off. He denied issues prior to dental issues. Dental issues had been resolved though. Had also had 2 falls on same night but when dealing with dental issues but none since that time.    He was worried about b12 levels. B12 was 212 (potentially low range) He started b12 1000 mcg daily b12.  Had also had 2 falls on same night but when dealing with dental issues.    Just had eyes examined and new glasses - had seen the spider image since then. Did not specifically mention to optho though.    Does not drink alcohol and does not do any drugs. No depression or history psychiatric disease. He is ok with drug screen.    Some changes to memory but not severe. No headaches around episodes -we opted to do an MRI of his brain as a result - considered neurology visit as well.  --reduced metoprolol as result A/P: ***   #Psoriatic arthritis-continued on Remicade gets labs every 16 weeks through Dr. Amil Amen.  Followed closely with rheumatology.  I did prescribe his tramadol as needed in past- now through rheumatology -Also [facility] by [facility] infectious disease for immunodeficiency-on Bactrim 3 days a week. As needed clindamycin as needed as well as Diflucan. Overall stable- continue current meds and [facility] follow up - he felt like not working as well- planned to discuss with rheumatology   #CAD #hyperlipidemia S: Medication:compliant with aspirin and simvastatin 20 mg - follows with cardiology -Nitroglycerin on hand but does not have to use  No CP or SOB  -Patient is on metoprolol as well. Will reduce dose Lab Results  Component Value Date   CHOL 90 02/25/2020   HDL 32 (L) 02/25/2020   LDLCALC 41 02/25/2020   TRIG 90 02/25/2020   CHOLHDL 2.8 02/25/2020   A/P: ***   #hypertension S: medication:  Amlodipine 10 mg daily, metoprolol 25 mg twice daily Home readings #s: *** BP Readings from Last 3 Encounters:   02/15/21 130/60  12/07/20 130/60  06/08/20 136/72  A/P: ***   #Chronic kidney disease stage III #hyperkalemia S: GFR is typically in the 40s *** range typically -Patient knows to avoid NSAIDs  -Had not tolerated ACE inhibitor in the past. Would consider ARB if needed but dont think potassium would tolerate - Bactrim could had contributed but patient needed this - considered nephrology opinion if potassium gets high A/P: ***   # Depression S: Medication:Bupropion 150 mg twice daily  Depression screen [facility] 2/9 02/15/2021 12/29/2020 12/07/2020  Decreased Interest 0 0 0  Down, Depressed, Hopeless 0 0 0  PHQ - 2 Score 0 0 0  Altered sleeping 1 - 0  Tired, decreased energy 1 - 3  Change in appetite 0 - 0  Feeling bad or failure about yourself  0 - 0  Trouble concentrating 0 - 0  Moving slowly or fidgety/restless 0 - 0  Suicidal thoughts 0 - 0  PHQ-9 Score 2 - 3  Difficult doing work/chores Not difficult at all - Not difficult at all  Some recent data might be hidden   A/P: ***  # Diabetes- using A1c goal of 7.5 or even 8 S: Medication:Amaryl 4 mg daily, Januvia 50 mg, Metformin 500 mg twice daily  CBGs- *** Exercise and diet- *** Lab Results  Component Value Date   HGBA1C 7.3 (H) 12/07/2020   HGBA1C 6.7 (H) 02/25/2020   HGBA1C 7.9 (H) 08/24/2019    A/P: ***  # B12 deficiency S: Current treatment/medication (oral vs. IM): b12 oral 1000 mcg  Lab Results  Component Value Date   VITAMINB12 893 02/15/2021   A/P: ***   Recommended follow up: No follow-ups on file. Future Appointments  Date Time Provider [place]  06/13/2021  8:20 AM Marin Olp, MD [facility]  01/11/2022  8:00 AM [facility]  COACH [facility]    No chief complaint on file.  Lab/Order associations:*** fasting No diagnosis found.  No orders of the defined types were placed in this encounter.  I,Jada Bradford,acting as a scribe for Garret Reddish, MD.,have documented all relevant  documentation on the behalf of Garret Reddish, MD,as directed by  Garret Reddish, MD while in the presence of Garret Reddish, MD.  ***  Return precautions advised.  Burnett Corrente

## 2021-06-06 DIAGNOSIS — D75839 Thrombocytosis, unspecified: Secondary | ICD-10-CM | POA: Diagnosis not present

## 2021-06-06 DIAGNOSIS — L4050 Arthropathic psoriasis, unspecified: Secondary | ICD-10-CM | POA: Diagnosis not present

## 2021-06-06 DIAGNOSIS — M1991 Primary osteoarthritis, unspecified site: Secondary | ICD-10-CM | POA: Diagnosis not present

## 2021-06-06 DIAGNOSIS — Z6825 Body mass index (BMI) 25.0-25.9, adult: Secondary | ICD-10-CM | POA: Diagnosis not present

## 2021-06-06 DIAGNOSIS — N183 Chronic kidney disease, stage 3 unspecified: Secondary | ICD-10-CM | POA: Diagnosis not present

## 2021-06-06 DIAGNOSIS — E663 Overweight: Secondary | ICD-10-CM | POA: Diagnosis not present

## 2021-06-07 ENCOUNTER — Other Ambulatory Visit: Payer: Self-pay | Admitting: Family Medicine

## 2021-06-13 ENCOUNTER — Other Ambulatory Visit: Payer: Self-pay | Admitting: Family Medicine

## 2021-06-13 ENCOUNTER — Encounter: Payer: Self-pay | Admitting: Family Medicine

## 2021-06-13 ENCOUNTER — Ambulatory Visit (INDEPENDENT_AMBULATORY_CARE_PROVIDER_SITE_OTHER): Payer: Medicare PPO | Admitting: Family Medicine

## 2021-06-13 VITALS — BP 118/70 | HR 66 | Temp 98.2°F | Ht 70.0 in | Wt 180.6 lb

## 2021-06-13 DIAGNOSIS — I2510 Atherosclerotic heart disease of native coronary artery without angina pectoris: Secondary | ICD-10-CM

## 2021-06-13 DIAGNOSIS — E785 Hyperlipidemia, unspecified: Secondary | ICD-10-CM

## 2021-06-13 DIAGNOSIS — E538 Deficiency of other specified B group vitamins: Secondary | ICD-10-CM

## 2021-06-13 DIAGNOSIS — E1169 Type 2 diabetes mellitus with other specified complication: Secondary | ICD-10-CM

## 2021-06-13 DIAGNOSIS — I10 Essential (primary) hypertension: Secondary | ICD-10-CM

## 2021-06-13 DIAGNOSIS — E11319 Type 2 diabetes mellitus with unspecified diabetic retinopathy without macular edema: Secondary | ICD-10-CM

## 2021-06-13 DIAGNOSIS — Z0000 Encounter for general adult medical examination without abnormal findings: Secondary | ICD-10-CM

## 2021-06-13 DIAGNOSIS — F3342 Major depressive disorder, recurrent, in full remission: Secondary | ICD-10-CM

## 2021-06-13 DIAGNOSIS — D8489 Other immunodeficiencies: Secondary | ICD-10-CM

## 2021-06-13 DIAGNOSIS — Z87891 Personal history of nicotine dependence: Secondary | ICD-10-CM

## 2021-06-13 DIAGNOSIS — L4050 Arthropathic psoriasis, unspecified: Secondary | ICD-10-CM | POA: Diagnosis not present

## 2021-06-13 DIAGNOSIS — E875 Hyperkalemia: Secondary | ICD-10-CM

## 2021-06-13 DIAGNOSIS — R443 Hallucinations, unspecified: Secondary | ICD-10-CM

## 2021-06-13 DIAGNOSIS — N183 Chronic kidney disease, stage 3 unspecified: Secondary | ICD-10-CM | POA: Diagnosis not present

## 2021-06-13 LAB — CBC WITH DIFFERENTIAL/PLATELET
Basophils Absolute: 0.1 10*3/uL (ref 0.0–0.1)
Basophils Relative: 1 % (ref 0.0–3.0)
Eosinophils Absolute: 0.4 10*3/uL (ref 0.0–0.7)
Eosinophils Relative: 5.8 % — ABNORMAL HIGH (ref 0.0–5.0)
HCT: 43.5 % (ref 39.0–52.0)
Hemoglobin: 14.7 g/dL (ref 13.0–17.0)
Lymphocytes Relative: 10.5 % — ABNORMAL LOW (ref 12.0–46.0)
Lymphs Abs: 0.8 10*3/uL (ref 0.7–4.0)
MCHC: 33.8 g/dL (ref 30.0–36.0)
MCV: 85.8 fl (ref 78.0–100.0)
Monocytes Absolute: 0.6 10*3/uL (ref 0.1–1.0)
Monocytes Relative: 9 % (ref 3.0–12.0)
Neutro Abs: 5.3 10*3/uL (ref 1.4–7.7)
Neutrophils Relative %: 73.7 % (ref 43.0–77.0)
Platelets: 633 10*3/uL — ABNORMAL HIGH (ref 150.0–400.0)
RBC: 5.07 Mil/uL (ref 4.22–5.81)
RDW: 16.6 % — ABNORMAL HIGH (ref 11.5–15.5)
WBC: 7.2 10*3/uL (ref 4.0–10.5)

## 2021-06-13 LAB — HEMOGLOBIN A1C: Hgb A1c MFr Bld: 7.7 % — ABNORMAL HIGH (ref 4.6–6.5)

## 2021-06-13 LAB — POC URINALSYSI DIPSTICK (AUTOMATED)
Bilirubin, UA: NEGATIVE
Blood, UA: NEGATIVE
Glucose, UA: NEGATIVE
Ketones, UA: NEGATIVE
Leukocytes, UA: NEGATIVE
Nitrite, UA: NEGATIVE
Protein, UA: NEGATIVE
Spec Grav, UA: 1.015 (ref 1.010–1.025)
Urobilinogen, UA: 0.2 E.U./dL
pH, UA: 6 (ref 5.0–8.0)

## 2021-06-13 LAB — COMPREHENSIVE METABOLIC PANEL
ALT: 19 U/L (ref 0–53)
AST: 24 U/L (ref 0–37)
Albumin: 4.6 g/dL (ref 3.5–5.2)
Alkaline Phosphatase: 41 U/L (ref 39–117)
BUN: 18 mg/dL (ref 6–23)
CO2: 22 mEq/L (ref 19–32)
Calcium: 9.8 mg/dL (ref 8.4–10.5)
Chloride: 104 mEq/L (ref 96–112)
Creatinine, Ser: 1.77 mg/dL — ABNORMAL HIGH (ref 0.40–1.50)
GFR: 36.9 mL/min — ABNORMAL LOW (ref >60.00)
Glucose, Bld: 177 mg/dL — ABNORMAL HIGH (ref 70–99)
Potassium: 4.8 mEq/L (ref 3.5–5.1)
Sodium: 135 mEq/L (ref 135–145)
Total Bilirubin: 0.6 mg/dL (ref 0.2–1.2)
Total Protein: 6.8 g/dL (ref 6.0–8.3)

## 2021-06-13 LAB — VITAMIN B12: Vitamin B-12: 378 pg/mL (ref 211–911)

## 2021-06-13 LAB — LIPID PANEL
Cholesterol: 95 mg/dL (ref 0–200)
HDL: 33.6 mg/dL — ABNORMAL LOW (ref >39.00)
LDL Cholesterol: 36 mg/dL (ref 0–99)
NonHDL: 60.9
Total CHOL/HDL Ratio: 3
Triglycerides: 124 mg/dL (ref 0.0–149.0)
VLDL: 24.8 mg/dL (ref 0.0–40.0)

## 2021-06-13 LAB — MICROALBUMIN / CREATININE URINE RATIO
Creatinine,U: 101.5 mg/dL
Microalb Creat Ratio: 1.9 mg/g (ref 0.0–30.0)
Microalb, Ur: 1.9 mg/dL (ref 0.0–1.9)

## 2021-06-13 MED ORDER — FLUTICASONE PROPIONATE 50 MCG/ACT NA SUSP: 2.0000 | Freq: Every day | NASAL | 3 refills | Status: DC

## 2021-06-13 NOTE — Patient Instructions (Addendum)
Schedule diabetic eye exam and have it sent to [place] at [phone].  ? ?I recommended prevnar 20 as did your infectious disease doctor- you declined for now- recheck next visit ? ? ?#allergies- intermittently using zyrtec- I want him to try consistently at bedtime for 3 weeks and also add in flonase 2 sprays each nostril during the same time frame to see if we can calm symptoms down ? ?Please stop by lab before you go ?If you have mychart- we will send your results within 3 business days of [place] receiving them.  ?If you do not have mychart- we will call you about results within 5 business days of [place] receiving them.  ?*please also note that you will see labs on mychart as soon as they post. I will later go in and write notes on them- will say "notes from Dr. Yong Channel"  ? ?Recommended follow up: Return in about 6 months (around 12/13/2021) for followup or sooner if needed.Schedule b4 you leave.  ?

## 2021-07-06 ENCOUNTER — Telehealth: Payer: Self-pay | Admitting: *Deleted

## 2021-07-06 ENCOUNTER — Ambulatory Visit: Payer: Medicare PPO | Admitting: Gastroenterology

## 2021-07-06 ENCOUNTER — Encounter: Payer: Self-pay | Admitting: Gastroenterology

## 2021-07-06 VITALS — BP 126/56 | HR 68 | Ht 71.0 in | Wt 182.4 lb

## 2021-07-06 DIAGNOSIS — Z86010 Personal history of colonic polyps: Secondary | ICD-10-CM | POA: Diagnosis not present

## 2021-07-06 DIAGNOSIS — K219 Gastro-esophageal reflux disease without esophagitis: Secondary | ICD-10-CM | POA: Diagnosis not present

## 2021-07-06 DIAGNOSIS — I340 Nonrheumatic mitral (valve) insufficiency: Secondary | ICD-10-CM | POA: Diagnosis not present

## 2021-07-06 MED ORDER — PLENVU 140 G PO SOLR: 1.0000 | ORAL | 0 refills | Status: DC

## 2021-07-06 NOTE — Telephone Encounter (Signed)
Dr. Armbruster,  This pt is cleared for anesthetic care at [facility].  Thanks,  Colyn Miron 

## 2021-07-06 NOTE — Progress Notes (Signed)
? ?HPI :  ?76 year old male with a history of colon polyps, mitral valve regurgitation, GERD, here for follow-up visit to discuss surveillance colonoscopy. ? ?Recall he had a colonoscopy in July 2021, had 11 adenomas removed at that time, all subcentimeter in size.  I recommended a repeat colonoscopy in 1 year given the burden of polyps on that exam.  He is here to schedule that today.  He denies any problems with his bowel habits.  Describes regular bowel movements with no blood in his stools.  No abdominal pains.  He states his father had colon polyps removed but no colon cancer known.  He has 2 children in their mid 73s who have never had a colonoscopy. ? ?In regards to his mitral valve regurgitation this is listed as moderate to severe on his last echocardiogram in September 2022.  In looking back in his chart he has had moderate to severe mitral valve regurgitation since 2017 and has been stable over time.  He has no dyspnea, no chest pain, no exertional symptoms.  He takes a baby aspirin once daily. ? ?He does have a history of reflux for only the past year.  He uses Pepcid daily or twice daily and it controls it pretty well.  He previously was having nocturnal heartburn.  He states no dysphagia.  He has been having symptoms for only 1 year, and no longstanding symptoms.  No family history of esophageal cancer.  No prior EGD. ? ?Of note his medical history is remarkable for CD4 lymphopenia and thrombocytosis and psoriatic arthritis, he is followed by [facility] for these issues.  He is on Remicade and tolerating it well, states his psoriatic arthritis is well controlled. ? ? ?Colonoscopy 09/15/19: ?One 3 mm polyp in the cecum, removed with a cold snare. Resected and retrieved. ?- One 7 mm polyp in the ascending colon, removed with a cold snare. Resected and retrieved. ?- Two 3 mm polyps at the hepatic flexure, removed with a cold snare. Resected and retrieved. ?- Three 3 to 4 mm polyps in the transverse colon, removed  with a cold snare. Resected and ?retrieved. ?- Three 3 to 5 mm polyps at the splenic flexure, removed with a cold snare. Resected and ?retrieved. ?- One 3 mm polyp in the descending colon, removed with a cold snare. Resected and ?retrieved. ?- One 3 mm polyp in the rectum, removed with a cold snare. Resected and retrieved. ?- Diverticulosis in the sigmoid colon. ?- Internal hemorrhoids. ?- The examination was otherwise normal. ? ? ?Surgical [P], colon, rectum, ascending and cecum, hepatic flexure, transverse, splenic flexure, descending, polyp (11) ?- TUBULAR ADENOMA(S). ?- NO HIGH GRADE DYSPLASIA OR CARCINOMA. ? ?Recommended repeat in 1 year ? ? ?Echo 11/30/20: ?EF 55-60%, moderate to severe MR ? ? ?Past Medical History:  ?Diagnosis Date  ? Allergic rhinitis   ? Anxiety   ? Atopic dermatitis   ? CAD (coronary artery disease)   ? Mild at cardiac catheterization 2004  ? CKD (chronic kidney disease) stage 3, GFR 30-59 ml/min (HCC)   ? Collagen vascular disease ([place])   ? Erectile dysfunction   ? Essential hypertension   ? GERD (gastroesophageal reflux disease)   ? Hyperlipidemia   ? Myocardial infarction [facility]) 2004  ? Suspected plaque rupture/aborted inferior infarct 2004, nonobstructive CAD managed medically  ? Osteoarthritis   ? Psoriatic arthritis ([place])   ? Type 2 diabetes mellitus ([place])   ? ? ? ?Past Surgical History:  ?Procedure Laterality Date  ?  COLONOSCOPY    ? ESOPHAGOGASTRODUODENOSCOPY  04/23/2005  ? KNEE ARTHROSCOPY    ? right  ? POLYPECTOMY  2008  ? TA x 4   ? TONSILLECTOMY    ? TOOTH EXTRACTION    ? x 4 removed   ? UPPER GASTROINTESTINAL ENDOSCOPY    ? ?Family History  ?Problem Relation Age of Onset  ? Dementia Mother   ? Other Mother   ?     abdominal issues led to death- apparently inoperable growth-  likely cancer  ? COPD Father   ?     led to death  ? Colon polyps Father   ? Diabetes Brother   ? Colon cancer Neg Hx   ? Esophageal cancer Neg Hx   ? Rectal cancer Neg Hx   ? Stomach cancer Neg Hx    ? ?Social History  ? ?Tobacco Use  ? Smoking status: Former  ?  Packs/day: 2.00  ?  Years: 30.00  ?  Pack years: 60.00  ?  Types: Cigarettes  ?  Quit date: 12/09/2001  ?  Years since quitting: 19.5  ? Smokeless tobacco: Former  ?  Types: Chew  ?  Quit date: 03/11/2002  ?Vaping Use  ? Vaping Use: Never used  ?Substance Use Topics  ? Alcohol use: No  ?  Alcohol/week: 0.0 standard drinks  ? Drug use: No  ? ?Current Outpatient Medications  ?Medication Sig Dispense Refill  ? ACCU-CHEK AVIVA PLUS test strip USE TO TEST BLOOD SUGAR EVERY DAY 100 strip 1  ? amLODipine (NORVASC) 10 MG tablet TAKE 1 TABLET BY MOUTH EVERY DAY 100 tablet 3  ? aspirin EC 81 MG tablet Take 81 mg by mouth daily.    ? clindamycin (CLEOCIN) 300 MG capsule Take 300 mg by mouth as needed.     ? famotidine (PEPCID) 20 MG tablet Take 1 tablet (20 mg total) by mouth 2 (two) times daily as needed for heartburn or indigestion. 60 tablet 11  ? fluconazole (DIFLUCAN) 100 MG tablet Take 100 mg by mouth daily as needed.     ? fluocinonide ointment (LIDEX) 0.05 % APPLY TOPICALLY ONCE DAILY AS NEEDED 60 g 3  ? fluticasone (FLONASE) 50 MCG/ACT nasal spray Place 2 sprays into both nostrils daily. 16 g 3  ? glimepiride (AMARYL) 4 MG tablet TAKE 1 TABLET(4 MG) BY MOUTH DAILY WITH BREAKFAST 90 tablet 0  ? glucose blood (ACCU-CHEK AVIVA PLUS) test strip     ? glucose blood (ACCU-CHEK AVIVA PLUS) test strip USE TO TEST BLOOD SUGAR ONCE DAILY    ? InFLIXimab (REMICADE IV) Inject into the vein every 8 (eight) weeks.     ? JANUVIA 50 MG tablet TAKE 1 TABLET BY MOUTH DAILY 30 tablet 0  ? metFORMIN (GLUCOPHAGE-XR) 500 MG 24 hr tablet TAKE 2 TABLETS BY MOUTH DAILY WITH BREAKFAST 180 tablet 3  ? nitroGLYCERIN (NITROSTAT) 0.4 MG SL tablet Place 0.4 mg under the tongue every 5 (five) minutes as needed for chest pain. Up to three time and then call the doctor    ? simvastatin (ZOCOR) 20 MG tablet TAKE 1 TABLET(20 MG) BY MOUTH AT BEDTIME 90 tablet 1  ? sulfamethoxazole-trimethoprim  (BACTRIM DS,SEPTRA DS) 800-160 MG tablet Take 1 tablet by mouth every Monday, Wednesday, and Friday.  5  ? tadalafil (CIALIS) 20 MG tablet Take 1 tablet (20 mg total) by mouth daily as needed for erectile dysfunction (no nitroglycerin within 72 hours). 10 tablet 5  ? traMADol (ULTRAM) 50  MG tablet TAKE 1 TABLET BY MOUTH TWICE DAILY AS NEEDED 60 tablet 5  ? valACYclovir (VALTREX) 500 MG tablet Take 1 tablet (500 mg total) by mouth as needed. (Patient taking differently: Take 500 mg by mouth 3 (three) times daily.) 60 tablet 3  ? ?No current facility-administered medications for this visit.  ? ?Allergies  ?Allergen Reactions  ? Amoxicillin   ?  Significant diffuse skin rash/irritation  ? Codeine   ?  REACTION: itching and rash ?Other reaction(s): rash  ? Desvenlafaxine   ?  "funny feeling"  ? Lisinopril   ? Methotrexate Other (See Comments)  ?  Other reaction(s): mouth sores  ? Penicillin G   ?  Other reaction(s): rash  ? Penicillins   ?  REACTION: as a child  ? Pristiq [Desvenlafaxine Succinate Er]   ?  "funny feeling"  ? ? ? ?Review of Systems: ?All systems reviewed and negative except where noted in HPI.  ? ? ?Lab Results  ?Component Value Date  ? WBC 7.2 06/13/2021  ? HGB 14.7 06/13/2021  ? HCT 43.5 06/13/2021  ? MCV 85.8 06/13/2021  ? PLT 633.0 (H) 06/13/2021  ? ? ?Lab Results  ?Component Value Date  ? CREATININE 1.77 (H) 06/13/2021  ? BUN 18 06/13/2021  ? NA 135 06/13/2021  ? K 4.8 06/13/2021  ? CL 104 06/13/2021  ? CO2 22 06/13/2021  ? ? ?Lab Results  ?Component Value Date  ? ALT 19 06/13/2021  ? AST 24 06/13/2021  ? ALKPHOS 41 06/13/2021  ? BILITOT 0.6 06/13/2021  ? ? ? ?Physical Exam: ?BP (!) 126/56   Pulse 68   Ht '5\' 11"'$  (1.803 m)   Wt 182 lb 6 oz (82.7 kg)   SpO2 98%   BMI 25.44 kg/m?  ?Constitutional: Pleasant,well-developed, male in no acute distress. ?Cardiovascular: Normal rate, regular rhythm.  ?Pulmonary/chest: Effort normal and breath sounds normal.  ?Abdominal: Soft, nondistended, nontender.   ?Neurological: Alert and oriented to person place and time. ?Psychiatric: Normal mood and affect. Behavior is normal. ? ? ?ASSESSMENT AND PLAN: ?76 year old male here for reassessment of the following: ? ?Histor

## 2021-07-06 NOTE — Patient Instructions (Signed)
You have been scheduled for a colonoscopy. Please follow written instructions given to you at your visit today.  ?Please pick up your prep supplies at the pharmacy within the next 1-3 days. ?If you use inhalers (even only as needed), please bring them with you on the day of your procedure. ? ?If you are age 77 or older, your body mass index should be between 23-30. Your Body mass index is 25.44 kg/m?Marland Kitchen If this is out of the aforementioned range listed, please consider follow up with your Primary Care Provider. ? ?If you are age 54 or younger, your body mass index should be between 19-25. Your Body mass index is 25.44 kg/m?Marland Kitchen If this is out of the aformentioned range listed, please consider follow up with your Primary Care Provider.  ? ?________________________________________________________ ? ?The [facility] providers would like to encourage you to use [facility] to communicate with providers for non-urgent requests or questions.  Due to long hold times on the telephone, sending your provider a message by [facility] may be a faster and more efficient way to get a response.  Please allow 48 business hours for a response.  Please remember that this is for non-urgent requests.  ?_______________________________________________________ ? ?Due to recent changes in healthcare laws, you may see the results of your imaging and laboratory studies on MyChart before your provider has had a chance to review them.  We understand that in some cases there may be results that are confusing or concerning to you. Not all laboratory results come back in the same time frame and the provider may be waiting for multiple results in order to interpret others.  Please give [place] 48 hours in order for your provider to thoroughly review all the results before contacting the office for clarification of your results.  ? ?

## 2021-07-11 DIAGNOSIS — L4050 Arthropathic psoriasis, unspecified: Secondary | ICD-10-CM | POA: Diagnosis not present

## 2021-07-13 ENCOUNTER — Other Ambulatory Visit: Payer: Self-pay | Admitting: Family Medicine

## 2021-07-13 DIAGNOSIS — I10 Essential (primary) hypertension: Secondary | ICD-10-CM

## 2021-08-12 ENCOUNTER — Other Ambulatory Visit: Payer: Self-pay | Admitting: Family Medicine

## 2021-08-16 ENCOUNTER — Encounter: Payer: Self-pay | Admitting: Gastroenterology

## 2021-08-23 ENCOUNTER — Ambulatory Visit (AMBULATORY_SURGERY_CENTER): Payer: Medicare PPO | Admitting: Gastroenterology

## 2021-08-23 ENCOUNTER — Encounter: Payer: Self-pay | Admitting: Gastroenterology

## 2021-08-23 VITALS — BP 112/56 | HR 62 | Temp 98.6°F | Resp 17 | Ht 71.0 in | Wt 182.0 lb

## 2021-08-23 DIAGNOSIS — I10 Essential (primary) hypertension: Secondary | ICD-10-CM | POA: Diagnosis not present

## 2021-08-23 DIAGNOSIS — Z86010 Personal history of colonic polyps: Secondary | ICD-10-CM

## 2021-08-23 DIAGNOSIS — Z09 Encounter for follow-up examination after completed treatment for conditions other than malignant neoplasm: Secondary | ICD-10-CM

## 2021-08-23 DIAGNOSIS — I2510 Atherosclerotic heart disease of native coronary artery without angina pectoris: Secondary | ICD-10-CM | POA: Diagnosis not present

## 2021-08-23 DIAGNOSIS — E119 Type 2 diabetes mellitus without complications: Secondary | ICD-10-CM | POA: Diagnosis not present

## 2021-08-23 MED ORDER — SODIUM CHLORIDE 0.9 % IV SOLN: 500.0000 mL | Freq: Once | INTRAVENOUS | Status: DC

## 2021-08-23 NOTE — Patient Instructions (Signed)
Handouts provided on diverticulosis and hemorrhoids.   YOU HAD AN ENDOSCOPIC PROCEDURE TODAY AT [facility]:   Refer to the procedure report that was given to you for any specific questions about what was found during the examination.  If the procedure report does not answer your questions, please call your gastroenterologist to clarify.  If you requested that your care partner not be given the details of your procedure findings, then the procedure report has been included in a sealed envelope for you to review at your convenience later.  YOU SHOULD EXPECT: Some feelings of bloating in the abdomen. Passage of more gas than usual.  Walking can help get rid of the air that was put into your GI tract during the procedure and reduce the bloating. If you had a lower endoscopy (such as a colonoscopy or flexible sigmoidoscopy) you may notice spotting of blood in your stool or on the toilet paper. If you underwent a bowel prep for your procedure, you may not have a normal bowel movement for a few days.  Please Note:  You might notice some irritation and congestion in your nose or some drainage.  This is from the oxygen used during your procedure.  There is no need for concern and it should clear up in a day or so.  SYMPTOMS TO REPORT IMMEDIATELY:  Following lower endoscopy (colonoscopy or flexible sigmoidoscopy):  Excessive amounts of blood in the stool  Significant tenderness or worsening of abdominal pains  Swelling of the abdomen that is new, acute  Fever of 100F or higher  For urgent or emergent issues, a gastroenterologist can be reached at any hour by calling [phone]. Do not use MyChart messaging for urgent concerns.    DIET:  We do recommend a small meal at first, but then you may proceed to your regular diet.  Drink plenty of fluids but you should avoid alcoholic beverages for 24 hours.  ACTIVITY:  You should plan to take it easy for the rest of today and you should NOT  DRIVE or use heavy machinery until tomorrow (because of the sedation medicines used during the test).    FOLLOW UP: Our staff will call the number listed on your records 24-72 hours following your procedure to check on you and address any questions or concerns that you may have regarding the information given to you following your procedure. If we do not reach you, we will leave a message.  We will attempt to reach you two times.  During this call, we will ask if you have developed any symptoms of COVID 19. If you develop any symptoms (ie: fever, flu-like symptoms, shortness of breath, cough etc.) before then, please call [phone].  If you test positive for Covid 19 in the 2 weeks post procedure, please call and report this information to [place].    If any biopsies were taken you will be contacted by phone or by letter within the next 1-3 weeks.  Please call us at [phone] if you have not heard about the biopsies in 3 weeks.    SIGNATURES/CONFIDENTIALITY: You and/or your care partner have signed paperwork which will be entered into your electronic medical record.  These signatures attest to the fact that that the information above on your After Visit Summary has been reviewed and is understood.  Full responsibility of the confidentiality of this discharge information lies with you and/or your care-partner.

## 2021-08-23 NOTE — Progress Notes (Signed)
[facility] History and Physical   Primary Care Physician:  Marin Olp, MD   Reason for Procedure:   History of colon polyps  Plan:    colonoscopy     HPI: Chase Anderson. is a 76 y.o. male  here for colonoscopy surveillance - 11 adenomas removed 7/021. Patient denies any bowel symptoms at this time. No family history of colon cancer known. Otherwise feels well without any cardiopulmonary symptoms. I have discussed risks / benefits and he wants to proceed.   Past Medical History:  Diagnosis Date   Abnormality of immune system    Allergic rhinitis    Allergy    Anxiety    Atopic dermatitis    CAD (coronary artery disease)    Mild at cardiac catheterization 2004   CKD (chronic kidney disease) stage 3, GFR 30-59 ml/min (HCC)    Collagen vascular disease (HCC)    Erectile dysfunction    Essential hypertension    GERD (gastroesophageal reflux disease)    Hyperlipidemia    Myocardial infarction ([place]) 2004   Suspected plaque rupture/aborted inferior infarct 2004, nonobstructive CAD managed medically   Osteoarthritis    Psoriatic arthritis ([place])    Shingles    Type 2 diabetes mellitus ([place])     Past Surgical History:  Procedure Laterality Date   COLONOSCOPY     ESOPHAGOGASTRODUODENOSCOPY  04/23/2005   KNEE ARTHROSCOPY     right   POLYPECTOMY  2008   TA x 4    TONSILLECTOMY     TOOTH EXTRACTION     x 4 removed    UPPER GASTROINTESTINAL ENDOSCOPY      Prior to Admission medications   Medication Sig Start Date End Date Taking? Authorizing Provider  ACCU-CHEK AVIVA PLUS test strip USE TO TEST BLOOD SUGAR EVERY DAY 02/07/20  Yes Marin Olp, MD  amLODipine (NORVASC) 10 MG tablet TAKE 1 TABLET BY MOUTH EVERY DAY 07/13/21  Yes Marin Olp, MD  aspirin EC 81 MG tablet Take 81 mg by mouth daily.   Yes [provider]  famotidine (PEPCID) 20 MG tablet Take 1 tablet (20 mg total) by mouth 2 (two) times daily as needed for heartburn or  indigestion. 02/15/21  Yes Marin Olp, MD  glimepiride (AMARYL) 4 MG tablet TAKE 1 TABLET(4 MG) BY MOUTH DAILY WITH BREAKFAST 06/07/21  Yes Marin Olp, MD  glucose blood (ACCU-CHEK AVIVA PLUS) test strip  04/12/13  Yes [provider]  glucose blood (ACCU-CHEK AVIVA PLUS) test strip USE TO TEST BLOOD SUGAR ONCE DAILY 10/20/18  Yes [provider]  JANUVIA 50 MG tablet TAKE 1 TABLET BY MOUTH DAILY 08/13/21  Yes Marin Olp, MD  metFORMIN (GLUCOPHAGE-XR) 500 MG 24 hr tablet TAKE 2 TABLETS BY MOUTH DAILY WITH BREAKFAST 01/25/21  Yes Marin Olp, MD  metoprolol tartrate (LOPRESSOR) 25 MG tablet Take 25 mg by mouth 2 (two) times daily. 07/14/21  Yes [provider]  sulfamethoxazole-trimethoprim (BACTRIM DS,SEPTRA DS) 800-160 MG tablet Take 1 tablet by mouth every Monday, Wednesday, and Friday. 06/30/15  Yes [provider]  traMADol (ULTRAM) 50 MG tablet TAKE 1 TABLET BY MOUTH TWICE DAILY AS NEEDED 07/15/17  Yes Marin Olp, MD  clindamycin (CLEOCIN) 300 MG capsule Take 300 mg by mouth as needed.  08/02/15   [provider]  fluconazole (DIFLUCAN) 100 MG tablet Take 100 mg by mouth daily as needed.     [provider]  fluocinonide ointment (  LIDEX) 0.05 % APPLY TOPICALLY ONCE DAILY AS NEEDED 02/07/16   Marin Olp, MD  fluticasone [facility]) 50 MCG/ACT nasal spray Place 2 sprays into both nostrils daily. 06/13/21   Marin Olp, MD  InFLIXimab (REMICADE IV) Inject into the vein every 8 (eight) weeks.     [provider]  nitroGLYCERIN (NITROSTAT) 0.4 MG SL tablet Place 0.4 mg under the tongue every 5 (five) minutes as needed for chest pain. Up to three time and then call the doctor Patient not taking: Reported on 08/23/2021    [provider]  simvastatin (ZOCOR) 20 MG tablet TAKE 1 TABLET(20 MG) BY MOUTH AT BEDTIME 03/19/21   Marin Olp, MD  tadalafil (CIALIS) 20 MG tablet Take 1 tablet (20 mg total)  by mouth daily as needed for erectile dysfunction (no nitroglycerin within 72 hours). 02/15/19   Marin Olp, MD  valACYclovir (VALTREX) 500 MG tablet Take 1 tablet (500 mg total) by mouth as needed. Patient taking differently: Take 500 mg by mouth 3 (three) times daily. 02/06/15   Dorena Cookey, MD    Current Outpatient Medications  Medication Sig Dispense Refill   ACCU-CHEK AVIVA PLUS test strip USE TO TEST BLOOD SUGAR EVERY DAY 100 strip 1   amLODipine (NORVASC) 10 MG tablet TAKE 1 TABLET BY MOUTH EVERY DAY 100 tablet 3   aspirin EC 81 MG tablet Take 81 mg by mouth daily.     famotidine (PEPCID) 20 MG tablet Take 1 tablet (20 mg total) by mouth 2 (two) times daily as needed for heartburn or indigestion. 60 tablet 11   glimepiride (AMARYL) 4 MG tablet TAKE 1 TABLET(4 MG) BY MOUTH DAILY WITH BREAKFAST 90 tablet 0   glucose blood (ACCU-CHEK AVIVA PLUS) test strip      glucose blood (ACCU-CHEK AVIVA PLUS) test strip USE TO TEST BLOOD SUGAR ONCE DAILY     JANUVIA 50 MG tablet TAKE 1 TABLET BY MOUTH DAILY 30 tablet 0   metFORMIN (GLUCOPHAGE-XR) 500 MG 24 hr tablet TAKE 2 TABLETS BY MOUTH DAILY WITH BREAKFAST 180 tablet 3   metoprolol tartrate (LOPRESSOR) 25 MG tablet Take 25 mg by mouth 2 (two) times daily.     sulfamethoxazole-trimethoprim (BACTRIM DS,SEPTRA DS) 800-160 MG tablet Take 1 tablet by mouth every Monday, Wednesday, and Friday.  5   traMADol (ULTRAM) 50 MG tablet TAKE 1 TABLET BY MOUTH TWICE DAILY AS NEEDED 60 tablet 5   clindamycin (CLEOCIN) 300 MG capsule Take 300 mg by mouth as needed.      fluconazole (DIFLUCAN) 100 MG tablet Take 100 mg by mouth daily as needed.      fluocinonide ointment (LIDEX) 0.05 % APPLY TOPICALLY ONCE DAILY AS NEEDED 60 g 3   fluticasone (FLONASE) 50 MCG/ACT nasal spray Place 2 sprays into both nostrils daily. 16 g 3   InFLIXimab (REMICADE IV) Inject into the vein every 8 (eight) weeks.      nitroGLYCERIN (NITROSTAT) 0.4 MG SL tablet Place 0.4 mg  under the tongue every 5 (five) minutes as needed for chest pain. Up to three time and then call the doctor (Patient not taking: Reported on 08/23/2021)     simvastatin (ZOCOR) 20 MG tablet TAKE 1 TABLET(20 MG) BY MOUTH AT BEDTIME 90 tablet 1   tadalafil (CIALIS) 20 MG tablet Take 1 tablet (20 mg total) by mouth daily as needed for erectile dysfunction (no nitroglycerin within 72 hours). 10 tablet 5   valACYclovir (VALTREX) 500 MG tablet Take  1 tablet (500 mg total) by mouth as needed. (Patient taking differently: Take 500 mg by mouth 3 (three) times daily.) 60 tablet 3   Current Facility-Administered Medications  Medication Dose Route Frequency Provider Last Rate Last Admin   0.9 %  sodium chloride infusion  500 mL Intravenous Once Dontrail Blackwell, Carlota Raspberry, MD        Allergies as of 08/23/2021 - Review Complete 08/23/2021  Allergen Reaction Noted   Amoxicillin  12/07/2020   Codeine  07/14/2008   Desvenlafaxine  02/15/2019   Lisinopril  02/06/2011   Methotrexate Other (See Comments) 11/22/2020   Penicillin g  01/17/2021   Penicillins  11/24/2006   Pristiq [desvenlafaxine succinate er]  02/15/2019    Family History  Problem Relation Age of Onset   Dementia Mother    Other Mother        abdominal issues led to death- apparently inoperable growth-  likely cancer   COPD Father        led to death   Colon polyps Father    Diabetes Brother    Colon cancer Neg Hx    Esophageal cancer Neg Hx    Rectal cancer Neg Hx    Stomach cancer Neg Hx     Social History   Socioeconomic History   Marital status: Married    Spouse name: Not on file   Number of children: Not on file   Years of education: Not on file   Highest education level: Not on file  Occupational History   Not on file  Tobacco Use   Smoking status: Former    Packs/day: 2.00    Years: 30.00    Total pack years: 60.00    Types: Cigarettes    Quit date: 12/09/2001    Years since quitting: 19.7   Smokeless tobacco: Former     Types: Chew    Quit date: 03/11/2002  Vaping Use   Vaping Use: Never used  Substance and Sexual Activity   Alcohol use: No    Alcohol/week: 0.0 standard drinks of alcohol   Drug use: No   Sexual activity: Not on file  Other Topics Concern   Not on file  Social History Narrative   Married. 2 children. 2 stepchildren. 7 grandkids including step   Take care of daughter of law Dashton Czerwinski- son left her with 85 year old.       Retired after  34 years [place] [employer]      Hobbies: golfer, gardening   Social Determinants of Health   Financial Resource Strain: Low Risk  (12/29/2020)   Overall Financial Resource Strain (CARDIA)    Difficulty of Paying Living Expenses: Not hard at all  Food Insecurity: No Food Insecurity (12/29/2020)   Hunger Vital Sign    Worried About Running Out of Food in the Last Year: Never true    Ran Out of Food in the Last Year: Never true  Transportation Needs: No Transportation Needs (12/29/2020)   PRAPARE - Hydrologist (Medical): No    Lack of Transportation (Non-Medical): No  Physical Activity: Insufficiently Active (12/29/2020)   Exercise Vital Sign    Days of Exercise per Week: 3 days    Minutes of Exercise per Session: 30 min  Stress: No Stress Concern Present (12/29/2020)   [place]    Feeling of Stress : Not at all  Social Connections: Moderately Integrated (12/29/2020)   Social Connection and  Isolation Panel [NHANES]    Frequency of Communication with Friends and Family: More than three times a week    Frequency of Social Gatherings with Friends and Family: More than three times a week    Attends Religious Services: More than 4 times per year    Active Member of [employer] or Organizations: No    Attends Archivist Meetings: Never    Marital Status: Married  Human resources officer Violence: Not At Risk (12/29/2020)   Humiliation, Afraid, Rape, and Kick  questionnaire    Fear of Current or Ex-Partner: No    Emotionally Abused: No    Physically Abused: No    Sexually Abused: No    Review of Systems: All other review of systems negative except as mentioned in the HPI.  Physical Exam: Vital signs BP 121/68   Pulse 70   Temp 98.6 F (37 C)   Ht '5\' 11"'$  (1.803 m)   Wt 182 lb (82.6 kg)   SpO2 98%   BMI 25.38 kg/m   General:   Alert,  Well-developed, pleasant and cooperative in NAD Lungs:  Clear throughout to auscultation.   Heart:  Regular rate and rhythm Abdomen:  Soft, nontender and nondistended.   Neuro/Psych:  Alert and cooperative. Normal mood and affect. A and O x 3  Jolly Mango, MD [facility]

## 2021-08-23 NOTE — Progress Notes (Signed)
Sedate, gd SR, tolerated procedure well, VSS, report to RN 

## 2021-08-23 NOTE — Op Note (Signed)
[facility] Patient Name: Chase Anderson Procedure Date: 08/23/2021 10:43 AM MRN: [id number] Endoscopist: Remo Lipps P. Havery Moros , MD Age: 76 Referring MD:  Date of Birth: June 28, 1945 Gender: Male Account #: [id number] Procedure:                Colonoscopy Indications:              High risk colon cancer surveillance: Personal                            history of colonic polyps - 11 adenomas removed                            09/2019 Medicines:                Monitored Anesthesia Care Procedure:                Pre-Anesthesia Assessment:                           - Prior to the procedure, a History and Physical                            was performed, and patient medications and                            allergies were reviewed. The patient's tolerance of                            previous anesthesia was also reviewed. The risks                            and benefits of the procedure and the sedation                            options and risks were discussed with the patient.                            All questions were answered, and informed consent                            was obtained. Prior Anticoagulants: The patient has                            taken no previous anticoagulant or antiplatelet                            agents. ASA Grade Assessment: III - A patient with                            severe systemic disease. After reviewing the risks                            and benefits, the patient was deemed in  satisfactory condition to undergo the procedure.                           After obtaining informed consent, the colonoscope                            was passed under direct vision. Throughout the                            procedure, the patient's blood pressure, pulse, and                            oxygen saturations were monitored continuously. The                            PCF-HQ190L Colonoscope was introduced through the                             anus and advanced to the the cecum, identified by                            appendiceal orifice and ileocecal valve. The                            colonoscopy was performed without difficulty. The                            patient tolerated the procedure well. The quality                            of the bowel preparation was good. The ileocecal                            valve, appendiceal orifice, and rectum were                            photographed. Scope In: 10:57:16 AM Scope Out: 11:13:48 AM Scope Withdrawal Time: 0 hours 12 minutes 49 seconds  Total Procedure Duration: 0 hours 16 minutes 32 seconds  Findings:                 The perianal and digital rectal examinations were                            normal.                           Multiple small-mouthed diverticula were found in                            the sigmoid colon.                           Internal hemorrhoids were found during retroflexion.  The exam was otherwise without abnormality. Complications:            No immediate complications. Estimated blood loss:                            None. Estimated Blood Loss:     Estimated blood loss: none. Impression:               - Diverticulosis in the sigmoid colon.                           - Internal hemorrhoids.                           - The examination was otherwise normal.                           - No polyps Recommendation:           - Patient has a contact number available for                            emergencies. The signs and symptoms of potential                            delayed complications were discussed with the                            patient. Return to normal activities tomorrow.                            Written discharge instructions were provided to the                            patient.                           - Resume previous diet.                           - Continue present  medications.                           - Consider repeat colonoscopy in 3 years for                            surveillance given burden of polyps on the last                            exam, will discuss with patient if he wishes to                            have any further surveillance exams or not given age Carlota Raspberry. Aydan Levitz, MD 08/23/2021 11:19:18 AM This report has been signed electronically.

## 2021-08-24 ENCOUNTER — Telehealth: Payer: Self-pay

## 2021-08-24 NOTE — Telephone Encounter (Signed)
  Follow up Call-     08/23/2021   10:12 AM 09/15/2019    7:06 AM  Call back number  Post procedure Call Back phone  # [phone] cell [phone]  Permission to leave phone message Yes Yes     Patient questions:  Do you have a fever, pain , or abdominal swelling? No. Pain Score  0 *  Have you tolerated food without any problems? Yes.    Have you been able to return to your normal activities? Yes.    Do you have any questions about your discharge instructions: Diet   No. Medications  No. Follow up visit  No.  Do you have questions or concerns about your Care? No.  Actions: * If pain score is 4 or above: No action needed, pain <4.

## 2021-09-05 DIAGNOSIS — Z79899 Other long term (current) drug therapy: Secondary | ICD-10-CM | POA: Diagnosis not present

## 2021-09-05 DIAGNOSIS — L4050 Arthropathic psoriasis, unspecified: Secondary | ICD-10-CM | POA: Diagnosis not present

## 2021-09-10 ENCOUNTER — Other Ambulatory Visit: Payer: Self-pay | Admitting: Family Medicine

## 2021-09-10 DIAGNOSIS — E7800 Pure hypercholesterolemia, unspecified: Secondary | ICD-10-CM

## 2021-09-11 ENCOUNTER — Other Ambulatory Visit: Payer: Self-pay | Admitting: Family Medicine

## 2021-10-08 ENCOUNTER — Other Ambulatory Visit: Payer: Self-pay | Admitting: Family Medicine

## 2021-10-08 ENCOUNTER — Other Ambulatory Visit: Payer: Self-pay

## 2021-10-08 MED ORDER — METOPROLOL TARTRATE 25 MG PO TABS: 25.0000 mg | ORAL_TABLET | Freq: Two times a day (BID) | ORAL | 0 refills | Status: DC

## 2021-10-31 DIAGNOSIS — Z79899 Other long term (current) drug therapy: Secondary | ICD-10-CM | POA: Diagnosis not present

## 2021-10-31 DIAGNOSIS — Z111 Encounter for screening for respiratory tuberculosis: Secondary | ICD-10-CM | POA: Diagnosis not present

## 2021-10-31 DIAGNOSIS — R5383 Other fatigue: Secondary | ICD-10-CM | POA: Diagnosis not present

## 2021-10-31 DIAGNOSIS — L4050 Arthropathic psoriasis, unspecified: Secondary | ICD-10-CM | POA: Diagnosis not present

## 2021-11-07 ENCOUNTER — Other Ambulatory Visit: Payer: Self-pay | Admitting: Family Medicine

## 2021-11-07 ENCOUNTER — Other Ambulatory Visit: Payer: Self-pay | Admitting: Cardiology

## 2021-11-08 ENCOUNTER — Other Ambulatory Visit: Payer: Self-pay | Admitting: *Deleted

## 2021-11-08 MED ORDER — METOPROLOL TARTRATE 25 MG PO TABS: 25.0000 mg | ORAL_TABLET | Freq: Two times a day (BID) | ORAL | 0 refills | Status: DC

## 2021-12-03 ENCOUNTER — Encounter: Payer: Self-pay | Admitting: *Deleted

## 2021-12-05 DIAGNOSIS — Z5181 Encounter for therapeutic drug level monitoring: Secondary | ICD-10-CM | POA: Diagnosis not present

## 2021-12-05 DIAGNOSIS — D849 Immunodeficiency, unspecified: Secondary | ICD-10-CM | POA: Diagnosis not present

## 2021-12-05 DIAGNOSIS — Z23 Encounter for immunization: Secondary | ICD-10-CM | POA: Diagnosis not present

## 2021-12-05 DIAGNOSIS — Z9189 Other specified personal risk factors, not elsewhere classified: Secondary | ICD-10-CM | POA: Diagnosis not present

## 2021-12-06 DIAGNOSIS — M1991 Primary osteoarthritis, unspecified site: Secondary | ICD-10-CM | POA: Diagnosis not present

## 2021-12-06 DIAGNOSIS — N183 Chronic kidney disease, stage 3 unspecified: Secondary | ICD-10-CM | POA: Diagnosis not present

## 2021-12-06 DIAGNOSIS — M79645 Pain in left finger(s): Secondary | ICD-10-CM | POA: Diagnosis not present

## 2021-12-06 DIAGNOSIS — Z6824 Body mass index (BMI) 24.0-24.9, adult: Secondary | ICD-10-CM | POA: Diagnosis not present

## 2021-12-06 DIAGNOSIS — L4050 Arthropathic psoriasis, unspecified: Secondary | ICD-10-CM | POA: Diagnosis not present

## 2021-12-06 DIAGNOSIS — D75839 Thrombocytosis, unspecified: Secondary | ICD-10-CM | POA: Diagnosis not present

## 2021-12-07 ENCOUNTER — Other Ambulatory Visit: Payer: Self-pay | Admitting: Family Medicine

## 2021-12-26 DIAGNOSIS — L4050 Arthropathic psoriasis, unspecified: Secondary | ICD-10-CM | POA: Diagnosis not present

## 2022-01-06 ENCOUNTER — Telehealth: Payer: Self-pay | Admitting: Family Medicine

## 2022-01-10 ENCOUNTER — Telehealth: Payer: Self-pay | Admitting: Cardiology

## 2022-01-10 MED ORDER — METOPROLOL TARTRATE 25 MG PO TABS: 25.0000 mg | ORAL_TABLET | Freq: Two times a day (BID) | ORAL | 6 refills | Status: DC

## 2022-01-10 NOTE — Telephone Encounter (Signed)
Error. Disregard addendum.

## 2022-01-10 NOTE — Telephone Encounter (Signed)
*  STAT* If patient is at the pharmacy, call can be transferred to refill team.   1. Which medications need to be refilled? (please list name of each medication and dose if known)   metoprolol tartrate (LOPRESSOR) 25 MG tablet  2. Which pharmacy/location (including street and city if local pharmacy) is medication to be sent to? [id number] - [place], [place] [address]   3. Do they need a 30 day or 90 day supply?   90 day supply Patient is scheduled for 07/26/22 with Dr. Domenic Polite.

## 2022-01-11 ENCOUNTER — Ambulatory Visit (INDEPENDENT_AMBULATORY_CARE_PROVIDER_SITE_OTHER): Payer: Medicare PPO

## 2022-01-11 VITALS — Wt 185.0 lb

## 2022-01-11 DIAGNOSIS — Z0000 Encounter for general adult medical examination without abnormal findings: Secondary | ICD-10-CM

## 2022-01-11 NOTE — Patient Instructions (Signed)
Chase Anderson , Thank you for taking time to come for your Medicare Wellness Visit. I appreciate your ongoing commitment to your health goals. Please review the following plan we discussed and let me know if I can assist you in the future.   These are the goals we discussed:  Goals      Exercise 150 minutes per week (moderate activity)     Will continue to exercise x 4 days a week   Patient recently stopped walking after death of dog- plans to restart      Patient Stated     To continue walking      Patient Stated     To get back to walking      Patient Stated     Maintain  health And activity         This is a list of the screening recommended for you and due dates:  Health Maintenance  Topic Date Due   Eye exam for diabetics  04/08/2019   COVID-19 Vaccine (5 - Moderna risk series) 09/09/2020   Hemoglobin A1C  12/13/2021   Pneumonia Vaccine (3 - PPSV23 or PCV20) 06/14/2022*   Yearly kidney function blood test for diabetes  06/14/2022   Yearly kidney health urinalysis for diabetes  06/14/2022   Complete foot exam   06/14/2022   Medicare Annual Wellness Visit  01/12/2023   Tetanus Vaccine  03/11/2023   Colon Cancer Screening  08/23/2024   Flu Shot  Completed   Hepatitis C Screening: USPSTF Recommendation to screen - Ages 18-79 yo.  Completed   Zoster (Shingles) Vaccine  Completed   HPV Vaccine  Aged Out  *Topic was postponed. The date shown is not the original due date.    Advanced directives: Advance directive discussed with you today. Even though you declined this today please call our office should you change your mind and we can give you the proper paperwork for you to fill out.  Conditions/risks identified: maintain health and activity   Next appointment: Follow up in one year for your annual wellness visit.   Preventive Care 71 Years and Older, Male  Preventive care refers to lifestyle choices and visits with your health care provider that can promote health and  wellness. What does preventive care include? A yearly physical exam. This is also called an annual well check. Dental exams once or twice a year. Routine eye exams. Ask your health care provider how often you should have your eyes checked. Personal lifestyle choices, including: Daily care of your teeth and gums. Regular physical activity. Eating a healthy diet. Avoiding tobacco and drug use. Limiting alcohol use. Practicing safe sex. Taking low doses of aspirin every day. Taking vitamin and mineral supplements as recommended by your health care provider. What happens during an annual well check? The services and screenings done by your health care provider during your annual well check will depend on your age, overall health, lifestyle risk factors, and family history of disease. Counseling  Your health care provider may ask you questions about your: Alcohol use. Tobacco use. Drug use. Emotional well-being. Home and relationship well-being. Sexual activity. Eating habits. History of falls. Memory and ability to understand (cognition). Work and work Statistician. Screening  You may have the following tests or measurements: Height, weight, and BMI. Blood pressure. Lipid and cholesterol levels. These may be checked every 5 years, or more frequently if you are over 31 years old. Skin check. Lung cancer screening. You may have this screening  every year starting at age 17 if you have a 30-pack-year history of smoking and currently smoke or have quit within the past 15 years. Fecal occult blood test (FOBT) of the stool. You may have this test every year starting at age 8. Flexible sigmoidoscopy or colonoscopy. You may have a sigmoidoscopy every 5 years or a colonoscopy every 10 years starting at age 74. Prostate cancer screening. Recommendations will vary depending on your family history and other risks. Hepatitis C blood test. Hepatitis B blood test. Sexually transmitted disease  (STD) testing. Diabetes screening. This is done by checking your blood sugar (glucose) after you have not eaten for a while (fasting). You may have this done every 1-3 years. Abdominal aortic aneurysm (AAA) screening. You may need this if you are a current or former smoker. Osteoporosis. You may be screened starting at age 38 if you are at high risk. Talk with your health care provider about your test results, treatment options, and if necessary, the need for more tests. Vaccines  Your health care provider may recommend certain vaccines, such as: Influenza vaccine. This is recommended every year. Tetanus, diphtheria, and acellular pertussis (Tdap, Td) vaccine. You may need a Td booster every 10 years. Zoster vaccine. You may need this after age 9. Pneumococcal 13-valent conjugate (PCV13) vaccine. One dose is recommended after age 33. Pneumococcal polysaccharide (PPSV23) vaccine. One dose is recommended after age 35. Talk to your health care provider about which screenings and vaccines you need and how often you need them. This information is not intended to replace advice given to you by your health care provider. Make sure you discuss any questions you have with your health care provider. Document Released: 03/24/2015 Document Revised: 11/15/2015 Document Reviewed: 12/27/2014 Elsevier Interactive Patient Education  2017 [employer] Prevention in the Home Falls can cause injuries. They can happen to people of all ages. There are many things you can do to make your home safe and to help prevent falls. What can I do on the outside of my home? Regularly fix the edges of walkways and driveways and fix any cracks. Remove anything that might make you trip as you walk through a door, such as a raised step or threshold. Trim any bushes or trees on the path to your home. Use bright outdoor lighting. Clear any walking paths of anything that might make someone trip, such as rocks or  tools. Regularly check to see if handrails are loose or broken. Make sure that both sides of any steps have handrails. Any raised decks and porches should have guardrails on the edges. Have any leaves, snow, or ice cleared regularly. Use sand or salt on walking paths during winter. Clean up any spills in your garage right away. This includes oil or grease spills. What can I do in the bathroom? Use night lights. Install grab bars by the toilet and in the tub and shower. Do not use towel bars as grab bars. Use non-skid mats or decals in the tub or shower. If you need to sit down in the shower, use a plastic, non-slip stool. Keep the floor dry. Clean up any water that spills on the floor as soon as it happens. Remove soap buildup in the tub or shower regularly. Attach bath mats securely with double-sided non-slip rug tape. Do not have throw rugs and other things on the floor that can make you trip. What can I do in the bedroom? Use night lights. Make sure that you have  a light by your bed that is easy to reach. Do not use any sheets or blankets that are too big for your bed. They should not hang down onto the floor. Have a firm chair that has side arms. You can use this for support while you get dressed. Do not have throw rugs and other things on the floor that can make you trip. What can I do in the kitchen? Clean up any spills right away. Avoid walking on wet floors. Keep items that you use a lot in easy-to-reach places. If you need to reach something above you, use a strong step stool that has a grab bar. Keep electrical cords out of the way. Do not use floor polish or wax that makes floors slippery. If you must use wax, use non-skid floor wax. Do not have throw rugs and other things on the floor that can make you trip. What can I do with my stairs? Do not leave any items on the stairs. Make sure that there are handrails on both sides of the stairs and use them. Fix handrails that are  broken or loose. Make sure that handrails are as long as the stairways. Check any carpeting to make sure that it is firmly attached to the stairs. Fix any carpet that is loose or worn. Avoid having throw rugs at the top or bottom of the stairs. If you do have throw rugs, attach them to the floor with carpet tape. Make sure that you have a light switch at the top of the stairs and the bottom of the stairs. If you do not have them, ask someone to add them for you. What else can I do to help prevent falls? Wear shoes that: Do not have high heels. Have rubber bottoms. Are comfortable and fit you well. Are closed at the toe. Do not wear sandals. If you use a stepladder: Make sure that it is fully opened. Do not climb a closed stepladder. Make sure that both sides of the stepladder are locked into place. Ask someone to hold it for you, if possible. Clearly mark and make sure that you can see: Any grab bars or handrails. First and last steps. Where the edge of each step is. Use tools that help you move around (mobility aids) if they are needed. These include: Canes. Walkers. Scooters. Crutches. Turn on the lights when you go into a dark area. Replace any light bulbs as soon as they burn out. Set up your furniture so you have a clear path. Avoid moving your furniture around. If any of your floors are uneven, fix them. If there are any pets around you, be aware of where they are. Review your medicines with your doctor. Some medicines can make you feel dizzy. This can increase your chance of falling. Ask your doctor what other things that you can do to help prevent falls. This information is not intended to replace advice given to you by your health care provider. Make sure you discuss any questions you have with your health care provider. Document Released: 12/22/2008 Document Revised: 08/03/2015 Document Reviewed: 04/01/2014 Elsevier Interactive Patient Education  2017 [employer].

## 2022-01-11 NOTE — Progress Notes (Signed)
I connected with  Chase Anderson. on 01/11/22 by a audio enabled telemedicine application and verified that I am speaking with the correct person using two identifiers.  Patient Location: Home  Provider Location: Office/Clinic  I discussed the limitations of evaluation and management by telemedicine. The patient expressed understanding and agreed to proceed.   Subjective:   Chase Anderson. is a 76 y.o. male who presents for Medicare Annual/Subsequent preventive examination.  Review of Systems     Cardiac Risk Factors include: advanced age (>79mn, >>40women);hypertension;male gender;dyslipidemia;diabetes mellitus     Objective:    Today's Vitals   01/11/22 0744  Weight: 185 lb (83.9 kg)   Body mass index is 25.8 kg/m.     01/11/2022    7:51 AM 12/29/2020    8:10 AM 12/24/2019    8:10 AM 11/23/2018   10:51 AM 11/20/2017   10:48 AM 10/30/2016    9:38 AM 08/03/2015   10:51 AM  Advanced Directives  Does Patient Have a Medical Advance Directive? No No No No No No No  Would patient like information on creating a medical advance directive? No - Patient declined No - Patient declined No - Patient declined Yes (MAU/Ambulatory/Procedural Areas - Information given)   No - patient declined information    Current Medications (verified) Outpatient Encounter Medications as of 01/11/2022  Medication Sig   ACCU-CHEK AVIVA PLUS test strip USE TO TEST BLOOD SUGAR EVERY DAY   amLODipine (NORVASC) 10 MG tablet TAKE 1 TABLET BY MOUTH EVERY DAY   aspirin EC 81 MG tablet Take 81 mg by mouth daily.   clindamycin (CLEOCIN) 300 MG capsule Take 300 mg by mouth as needed.    famotidine (PEPCID) 20 MG tablet Take 1 tablet (20 mg total) by mouth 2 (two) times daily as needed for heartburn or indigestion.   fluconazole (DIFLUCAN) 100 MG tablet Take 100 mg by mouth daily as needed.    fluocinonide ointment (LIDEX) 0.05 % APPLY TOPICALLY ONCE DAILY AS NEEDED   fluticasone (FLONASE) 50 MCG/ACT nasal  spray Place 2 sprays into both nostrils daily.   glimepiride (AMARYL) 4 MG tablet TAKE 1 TABLET(4 MG) BY MOUTH DAILY WITH BREAKFAST   glucose blood (ACCU-CHEK AVIVA PLUS) test strip    glucose blood (ACCU-CHEK AVIVA PLUS) test strip USE TO TEST BLOOD SUGAR ONCE DAILY   InFLIXimab (REMICADE IV) Inject into the vein every 8 (eight) weeks.    JANUVIA 50 MG tablet TAKE 1 TABLET BY MOUTH DAILY   metFORMIN (GLUCOPHAGE-XR) 500 MG 24 hr tablet TAKE 2 TABLETS BY MOUTH DAILY WITH BREAKFAST   metoprolol tartrate (LOPRESSOR) 25 MG tablet Take 1 tablet (25 mg total) by mouth 2 (two) times daily.   nitroGLYCERIN (NITROSTAT) 0.4 MG SL tablet Place 0.4 mg under the tongue every 5 (five) minutes as needed for chest pain. Up to three time and then call the doctor   simvastatin (ZOCOR) 20 MG tablet TAKE 1 TABLET(20 MG) BY MOUTH AT BEDTIME   sulfamethoxazole-trimethoprim (BACTRIM DS,SEPTRA DS) 800-160 MG tablet Take 1 tablet by mouth every Monday, Wednesday, and Friday.   tadalafil (CIALIS) 20 MG tablet Take 1 tablet (20 mg total) by mouth daily as needed for erectile dysfunction (no nitroglycerin within 72 hours).   traMADol (ULTRAM) 50 MG tablet TAKE 1 TABLET BY MOUTH TWICE DAILY AS NEEDED   valACYclovir (VALTREX) 500 MG tablet Take 1 tablet (500 mg total) by mouth as needed. (Patient taking differently: Take 500 mg by mouth  3 (three) times daily.)   No facility-administered encounter medications on file as of 01/11/2022.    Allergies (verified) Amoxicillin, Codeine, Desvenlafaxine, Lisinopril, Methotrexate, Penicillin g, Penicillins, and Pristiq [desvenlafaxine succinate er]   History: Past Medical History:  Diagnosis Date   Abnormality of immune system    Allergic rhinitis    Allergy    Anxiety    Atopic dermatitis    CAD (coronary artery disease)    Mild at cardiac catheterization 2004   CKD (chronic kidney disease) stage 3, GFR 30-59 ml/min (HCC)    Collagen vascular disease (HCC)    Erectile  dysfunction    Essential hypertension    GERD (gastroesophageal reflux disease)    Hyperlipidemia    Myocardial infarction ([place]) 2004   Suspected plaque rupture/aborted inferior infarct 2004, nonobstructive CAD managed medically   Osteoarthritis    Psoriatic arthritis ([place])    Shingles    Type 2 diabetes mellitus ([place])    Past Surgical History:  Procedure Laterality Date   COLONOSCOPY     ESOPHAGOGASTRODUODENOSCOPY  04/23/2005   KNEE ARTHROSCOPY     right   POLYPECTOMY  2008   TA x 4    TONSILLECTOMY     TOOTH EXTRACTION     x 4 removed    UPPER GASTROINTESTINAL ENDOSCOPY     Family History  Problem Relation Age of Onset   Dementia Mother    Other Mother        abdominal issues led to death- apparently inoperable growth-  likely cancer   COPD Father        led to death   Colon polyps Father    Diabetes Brother    Colon cancer Neg Hx    Esophageal cancer Neg Hx    Rectal cancer Neg Hx    Stomach cancer Neg Hx    Social History   Socioeconomic History   Marital status: Married    Spouse name: Not on file   Number of children: Not on file   Years of education: Not on file   Highest education level: Not on file  Occupational History   Not on file  Tobacco Use   Smoking status: Former    Packs/day: 2.00    Years: 30.00    Total pack years: 60.00    Types: Cigarettes    Quit date: 12/09/2001    Years since quitting: 20.1   Smokeless tobacco: Former    Types: Chew    Quit date: 03/11/2002  Vaping Use   Vaping Use: Never used  Substance and Sexual Activity   Alcohol use: No    Alcohol/week: 0.0 standard drinks of alcohol   Drug use: No   Sexual activity: Not on file  Other Topics Concern   Not on file  Social History Narrative   Married. 2 children. 2 stepchildren. 7 grandkids including step   Take care of daughter of law Chase Anderson- son left her with 90 year old.       Retired after  47 years [employer]      Hobbies: golfer, gardening   Social  Determinants of Health   Financial Resource Strain: Low Risk  (01/11/2022)   Overall Financial Resource Strain (CARDIA)    Difficulty of Paying Living Expenses: Not hard at all  Food Insecurity: No Food Insecurity (01/11/2022)   Hunger Vital Sign    Worried About Running Out of Food in the Last Year: Never true    Ran Out of Food in  the Last Year: Never true  Transportation Needs: No Transportation Needs (01/11/2022)   PRAPARE - Hydrologist (Medical): No    Lack of Transportation (Non-Medical): No  Physical Activity: Inactive (01/11/2022)   Exercise Vital Sign    Days of Exercise per Week: 0 days    Minutes of Exercise per Session: 0 min  Stress: No Stress Concern Present (01/11/2022)   [place]    Feeling of Stress : Not at all  Social Connections: Moderately Integrated (01/11/2022)   Social Connection and Isolation Panel [NHANES]    Frequency of Communication with Friends and Family: Twice a week    Frequency of Social Gatherings with Friends and Family: More than three times a week    Attends Religious Services: More than 4 times per year    Active Member of [employer] or Organizations: No    Attends Music therapist: Never    Marital Status: Married    Tobacco Counseling Counseling given: Not Answered   Clinical Intake:  Pre-visit preparation completed: Yes  Pain : No/denies pain     BMI - recorded: 25.8 Nutritional Status: BMI 25 -29 Overweight Nutritional Risks: None Diabetes: Yes CBG done?: Yes (180 per pt) CBG resulted in Enter/ Edit results?: No Did pt. bring in CBG monitor from home?: No  How often do you need to have someone help you when you read instructions, pamphlets, or other written materials from your doctor or pharmacy?: 1 - Never  Diabetic?Nutrition Risk Assessment:  Has the patient had any N/V/D within the last 2 months?  No  Does the patient  have any non-healing wounds?  No  Has the patient had any unintentional weight loss or weight gain?  No   Diabetes:  Is the patient diabetic?  Yes  If diabetic, was a CBG obtained today?  Yes  Did the patient bring in their glucometer from home?  No  How often do you monitor your CBG's? Daily .   Financial Strains and Diabetes Management:  Are you having any financial strains with the device, your supplies or your medication? No .  Does the patient want to be seen by Chronic Care Management for management of their diabetes?  No  Would the patient like to be referred to a Nutritionist or for Diabetic Management?  No   Diabetic Exams:  Diabetic Eye Exam: Overdue for diabetic eye exam. Pt has been advised about the importance in completing this exam. Patient advised to call and schedule an eye exam. Diabetic Foot Exam: Completed 06/13/21   Interpreter Needed?: No  Information entered by :: Charlott Rakes, LPN   Activities of Daily Living    01/11/2022    7:52 AM 01/08/2022    7:50 PM  In your present state of health, do you have any difficulty performing the following activities:  Hearing? 0 0  Vision? 0 0  Difficulty concentrating or making decisions? 0 0  Walking or climbing stairs? 0 0  Dressing or bathing? 0 0  Doing errands, shopping? 0 0  Preparing Food and eating ? N N  Using the Toilet? N N  In the past six months, have you accidently leaked urine? N N  Do you have problems with loss of bowel control? N N  Managing your Medications? N N  Managing your Finances? N N  Housekeeping or managing your Housekeeping? N N    Patient Care Team: Marin Olp,  MD as PCP - General (Family Medicine) Satira Sark, MD as PCP - Cardiology (Cardiology) Unice Bailey, MD as Consulting Physician (Rheumatology) Alfonzo Beers, MD as Consulting Physician (Infectious Diseases) Hennie Duos, MD as Consulting Physician (Rheumatology) Madelin Headings, DO as  Consulting Physician (Optometry)  Indicate any recent Medical Services you may have received from other than [facility] providers in the past year (date may be approximate).     Assessment:   This is a routine wellness examination for [place].  Hearing/Vision screen Hearing Screening - Comments:: Pt stated HOH  Vision Screening - Comments:: Pt follows up with Dr Jorja Loa for annual eye exams   Dietary issues and exercise activities discussed: Current Exercise Habits: The patient does not participate in regular exercise at present   Goals Addressed             This Visit's Progress    Patient Stated       Maintain  health And activity        Depression Screen    01/11/2022    7:50 AM 06/13/2021    8:05 AM 02/15/2021    4:53 PM 12/29/2020    8:09 AM 12/07/2020    9:33 AM 02/25/2020    9:14 AM 12/24/2019    8:08 AM  PHQ 2/9 Scores  PHQ - 2 Score 0 0 0 0 0 0 0  PHQ- 9 Score  0 2  3 0     Fall Risk    01/11/2022    7:52 AM 01/08/2022    7:50 PM 12/29/2020    8:11 AM 12/07/2020    9:32 AM 02/25/2020    9:14 AM  Fall Risk   Falls in the past year? 0 0 0 1 0  Number falls in past yr: 0  0 1 0  Injury with Fall? 0  0 0 0  Risk for fall due to : Impaired vision  Impaired vision No Fall Risks   Follow up Falls prevention discussed  Falls prevention discussed Falls evaluation completed     FALL RISK PREVENTION PERTAINING TO THE HOME:  Any stairs in or around the home? No  If so, are there any without handrails? No  Home free of loose throw rugs in walkways, pet beds, electrical cords, etc? Yes  Adequate lighting in your home to reduce risk of falls? Yes   ASSISTIVE DEVICES UTILIZED TO PREVENT FALLS:  Life alert? No  Use of a cane, walker or w/c? No  Grab bars in the bathroom? No  Shower chair or bench in shower? No  Elevated toilet seat or a handicapped toilet? No   TIMED UP AND GO:  Was the test performed? No .  Cognitive Function:        01/11/2022    7:52 AM  12/29/2020    8:13 AM 12/24/2019    8:15 AM 11/23/2018   10:52 AM  6CIT Screen  What Year? 0 points 0 points 0 points 0 points  What month? 0 points 0 points 0 points 0 points  What time? 0 points 0 points  0 points  Count back from 20 0 points 0 points 0 points 0 points  Months in reverse 0 points 0 points 0 points 0 points  Repeat phrase 0 points 2 points 0 points 0 points  Total Score 0 points 2 points  0 points    Immunizations Immunization History  Administered Date(s) Administered   Fluad Quad(high Dose 65+) 11/23/2018, 12/07/2020   Influenza  Split 01/23/2012, 11/19/2012   Influenza Whole 11/26/2007, 12/14/2009   Influenza, High Dose Seasonal PF 02/01/2014, 02/06/2015, 02/07/2016, 12/06/2016   Influenza, Seasonal, Injecte, Preservative Fre 01/05/2009   Influenza,inj,Quad PF,6+ Mos 11/19/2012, 12/30/2019   Influenza-Unspecified 01/05/2009, 02/01/2014, 02/06/2015, 02/07/2016, 12/06/2016, 11/27/2017, 11/23/2018   Moderna Sars-Covid-2 Vaccination 04/07/2019, 05/05/2019, 01/15/2020, 07/15/2020   Pneumococcal Conjugate-13 06/02/2013   Pneumococcal Polysaccharide-23 03/11/2004, 06/29/2009   Td 03/11/2002   Tetanus 03/10/2013   Zoster Recombinat (Shingrix) 06/10/2017, 10/01/2017    TDAP status: Up to date  Flu Vaccine status: Up to date  Pneumococcal vaccine status: Up to date  Covid-19 vaccine status: Completed vaccines  Qualifies for Shingles Vaccine? Yes   Zostavax completed Yes   Shingrix Completed?: Yes  Screening Tests Health Maintenance  Topic Date Due   OPHTHALMOLOGY EXAM  04/08/2019   COVID-19 Vaccine (5 - Moderna risk series) 09/09/2020   HEMOGLOBIN A1C  12/13/2021   Pneumonia Vaccine 66+ Years old (3 - PPSV23 or PCV20) 06/14/2022 (Originally 06/30/2014)   Diabetic kidney evaluation - GFR measurement  06/14/2022   Diabetic kidney evaluation - Urine ACR  06/14/2022   FOOT EXAM  06/14/2022   Medicare Annual Wellness (AWV)  01/12/2023   TETANUS/TDAP   03/11/2023   COLONOSCOPY (Pts 45-66yr Insurance coverage will need to be confirmed)  08/23/2024   INFLUENZA VACCINE  Completed   Hepatitis C Screening  Completed   Zoster Vaccines- Shingrix  Completed   HPV VACCINES  Aged Out    Health Maintenance  Health Maintenance Due  Topic Date Due   OPHTHALMOLOGY EXAM  04/08/2019   COVID-19 Vaccine (5 - Moderna risk series) 09/09/2020   HEMOGLOBIN A1C  12/13/2021    Colorectal cancer screening: Type of screening: Colonoscopy. Completed 08/23/21. Repeat every 3 years    Additional Screening:  Hepatitis C Screening:  Completed 02/07/16  Vision Screening: Recommended annual ophthalmology exams for early detection of glaucoma and other disorders of the eye. Is the patient up to date with their annual eye exam?  No  Who is the provider or what is the name of the office in which the patient attends annual eye exams? Dr CJorja Loa If pt is not established with a provider, would they like to be referred to a provider to establish care? No .   Dental Screening: Recommended annual dental exams for proper oral hygiene  Community Resource Referral / Chronic Care Management: CRR required this visit?  No   CCM required this visit?  No      Plan:     I have personally reviewed and noted the following in the patient's chart:   Medical and social history Use of alcohol, tobacco or illicit drugs  Current medications and supplements including opioid prescriptions. Patient is currently taking opioid prescriptions. Information provided to patient regarding non-opioid alternatives. Patient advised to discuss non-opioid treatment plan with their provider. Functional ability and status Nutritional status Physical activity Advanced directives List of other physicians Hospitalizations, surgeries, and ER visits in previous 12 months Vitals Screenings to include cognitive, depression, and falls Referrals and appointments  In addition, I have reviewed  and discussed with patient certain preventive protocols, quality metrics, and best practice recommendations. A written personalized care plan for preventive services as well as general preventive health recommendations were provided to patient.     TWillette Brace LPN   [id number]  Nurse Notes: none

## 2022-01-25 ENCOUNTER — Telehealth: Payer: Self-pay | Admitting: *Deleted

## 2022-01-25 NOTE — Patient Outreach (Signed)
  Care Coordination   01/25/2022 Name: Chase Anderson. MRN: [id number] DOB: Jul 24, 1945   Care Coordination Outreach Attempts:  An unsuccessful telephone outreach was attempted today to offer the patient information about available care coordination services as a benefit of their health plan.   Follow Up Plan:  Additional outreach attempts will be made to offer the patient care coordination information and services.   Encounter Outcome:  No Answer  Care Coordination Interventions Activated:  No   Care Coordination Interventions:  No, not indicated    Raina Mina, RN Care Management Coordinator [place] Office [phone]

## 2022-02-05 ENCOUNTER — Other Ambulatory Visit: Payer: Self-pay | Admitting: Family Medicine

## 2022-02-20 DIAGNOSIS — L4050 Arthropathic psoriasis, unspecified: Secondary | ICD-10-CM | POA: Diagnosis not present

## 2022-03-07 ENCOUNTER — Other Ambulatory Visit: Payer: Self-pay | Admitting: Family Medicine

## 2022-03-07 DIAGNOSIS — E113293 Type 2 diabetes mellitus with mild nonproliferative diabetic retinopathy without macular edema, bilateral: Secondary | ICD-10-CM | POA: Diagnosis not present

## 2022-03-07 LAB — HM DIABETES EYE EXAM

## 2022-03-12 ENCOUNTER — Other Ambulatory Visit: Payer: Self-pay | Admitting: Family Medicine

## 2022-03-19 ENCOUNTER — Encounter: Payer: Self-pay | Admitting: Family Medicine

## 2022-03-19 ENCOUNTER — Ambulatory Visit (INDEPENDENT_AMBULATORY_CARE_PROVIDER_SITE_OTHER): Payer: Medicare PPO | Admitting: Family Medicine

## 2022-03-19 ENCOUNTER — Telehealth: Payer: Self-pay

## 2022-03-19 VITALS — BP 124/72 | HR 89 | Temp 98.3°F | Ht 71.0 in | Wt 179.8 lb

## 2022-03-19 DIAGNOSIS — F3342 Major depressive disorder, recurrent, in full remission: Secondary | ICD-10-CM

## 2022-03-19 DIAGNOSIS — L4050 Arthropathic psoriasis, unspecified: Secondary | ICD-10-CM | POA: Diagnosis not present

## 2022-03-19 DIAGNOSIS — E538 Deficiency of other specified B group vitamins: Secondary | ICD-10-CM | POA: Diagnosis not present

## 2022-03-19 DIAGNOSIS — D8489 Other immunodeficiencies: Secondary | ICD-10-CM

## 2022-03-19 DIAGNOSIS — E11319 Type 2 diabetes mellitus with unspecified diabetic retinopathy without macular edema: Secondary | ICD-10-CM | POA: Diagnosis not present

## 2022-03-19 LAB — CBC WITH DIFFERENTIAL/PLATELET
Basophils Absolute: 0.1 10*3/uL (ref 0.0–0.1)
Basophils Relative: 1.3 % (ref 0.0–3.0)
Eosinophils Absolute: 0.4 10*3/uL (ref 0.0–0.7)
Eosinophils Relative: 5.4 % — ABNORMAL HIGH (ref 0.0–5.0)
HCT: 45.4 % (ref 39.0–52.0)
Hemoglobin: 15.4 g/dL (ref 13.0–17.0)
Lymphocytes Relative: 11.6 % — ABNORMAL LOW (ref 12.0–46.0)
Lymphs Abs: 0.9 10*3/uL (ref 0.7–4.0)
MCHC: 34 g/dL (ref 30.0–36.0)
MCV: 84 fl (ref 78.0–100.0)
Monocytes Absolute: 0.7 10*3/uL (ref 0.1–1.0)
Monocytes Relative: 8.9 % (ref 3.0–12.0)
Neutro Abs: 5.9 10*3/uL (ref 1.4–7.7)
Neutrophils Relative %: 72.8 % (ref 43.0–77.0)
Platelets: 853 10*3/uL — ABNORMAL HIGH (ref 150.0–400.0)
RBC: 5.4 Mil/uL (ref 4.22–5.81)
RDW: 15.3 % (ref 11.5–15.5)
WBC: 8.2 10*3/uL (ref 4.0–10.5)

## 2022-03-19 LAB — COMPREHENSIVE METABOLIC PANEL
ALT: 17 U/L (ref 0–53)
AST: 19 U/L (ref 0–37)
Albumin: 4.6 g/dL (ref 3.5–5.2)
Alkaline Phosphatase: 44 U/L (ref 39–117)
BUN: 23 mg/dL (ref 6–23)
CO2: 25 mEq/L (ref 19–32)
Calcium: 10.1 mg/dL (ref 8.4–10.5)
Chloride: 102 mEq/L (ref 96–112)
Creatinine, Ser: 1.72 mg/dL — ABNORMAL HIGH (ref 0.40–1.50)
GFR: 37.98 mL/min — ABNORMAL LOW (ref >60.00)
Glucose, Bld: 133 mg/dL — ABNORMAL HIGH (ref 70–99)
Potassium: 6.6 mEq/L (ref 3.5–5.1)
Sodium: 136 mEq/L (ref 135–145)
Total Bilirubin: 0.6 mg/dL (ref 0.2–1.2)
Total Protein: 6.8 g/dL (ref 6.0–8.3)

## 2022-03-19 LAB — HEMOGLOBIN A1C: Hgb A1c MFr Bld: 7.7 % — ABNORMAL HIGH (ref 4.6–6.5)

## 2022-03-19 LAB — VITAMIN B12: Vitamin B-12: 236 pg/mL (ref 211–911)

## 2022-03-19 MED ORDER — TRAMADOL HCL 50 MG PO TABS: 50.0000 mg | ORAL_TABLET | Freq: Two times a day (BID) | ORAL | 5 refills | Status: DC | PRN

## 2022-03-19 NOTE — Telephone Encounter (Signed)
See result note on this-possible lab error but also could be related to Bactrim

## 2022-03-19 NOTE — Progress Notes (Signed)
Phone [phone] In person visit   Subjective:   Chase Anderson. is a 77 y.o. year old very pleasant male patient who presents for/with See problem oriented charting Chief Complaint  Patient presents with   Follow-up   Hyperlipidemia   Hypertension   Diabetes   tramadol    Pt would like to know if you will take over his tramadol.   Past Medical History-  Patient Active Problem List   Diagnosis Date Noted   Thrombocytosis 04/03/2016    Priority: High   Depression 03/27/2015    Priority: High   Psoriatic arthritis ([place]) 01/25/2013    Priority: High   Mitral valve prolapse 12/25/2010    Priority: High   Leukocytopenia 11/24/2006    Priority: High   Controlled type 2 diabetes mellitus with ophthalmic complication ([place]) 62/83/6629    Priority: High   CAD (coronary artery disease) with history MI 2004- no intervention 10/06/2006    Priority: High   Mitral valve regurgitation 02/15/2019    Priority: Medium    Dyshidrotic eczema 02/07/2016    Priority: Medium    CKD (chronic kidney disease), stage III ([place]) 09/15/2015    Priority: Medium    History of BPH 12/14/2009    Priority: Medium    Hyperlipidemia associated with type 2 diabetes mellitus ([place]) 10/06/2006    Priority: Medium    Hypertension associated with diabetes ([place]) 10/06/2006    Priority: Medium    GERD (gastroesophageal reflux disease) 08/24/2019    Priority: Low   Diabetic retinopathy ([place]) 02/20/2017    Priority: Low   ERECTILE DYSFUNCTION 11/26/2007    Priority: Low   Candidiasis 11/25/2012   Immunodeficiency disorder with predominant T-cell defect ([place]) 11/19/2012    Medications- reviewed and updated Current Outpatient Medications  Medication Sig Dispense Refill   ACCU-CHEK AVIVA PLUS test strip USE TO TEST BLOOD SUGAR EVERY DAY 100 strip 1   amLODipine (NORVASC) 10 MG tablet TAKE 1 TABLET BY MOUTH EVERY DAY 100 tablet 3   aspirin EC 81 MG tablet Take 81 mg by mouth daily.     clindamycin  (CLEOCIN) 300 MG capsule Take 300 mg by mouth as needed.      famotidine (PEPCID) 20 MG tablet TAKE 1 TABLET(20 MG) BY MOUTH TWICE DAILY AS NEEDED FOR HEARTBURN OR INDIGESTION 60 tablet 11   fluconazole (DIFLUCAN) 100 MG tablet Take 100 mg by mouth daily as needed.      fluocinonide ointment (LIDEX) 0.05 % APPLY TOPICALLY ONCE DAILY AS NEEDED 60 g 3   fluticasone (FLONASE) 50 MCG/ACT nasal spray Place 2 sprays into both nostrils daily. 16 g 3   glimepiride (AMARYL) 4 MG tablet TAKE 1 TABLET(4 MG) BY MOUTH DAILY WITH BREAKFAST 90 tablet 0   glucose blood (ACCU-CHEK AVIVA PLUS) test strip USE TO TEST BLOOD SUGAR ONCE DAILY     InFLIXimab (REMICADE IV) Inject into the vein every 8 (eight) weeks.      JANUVIA 50 MG tablet TAKE 1 TABLET BY MOUTH DAILY 30 tablet 0   metFORMIN (GLUCOPHAGE-XR) 500 MG 24 hr tablet TAKE 2 TABLETS BY MOUTH DAILY WITH BREAKFAST 180 tablet 3   metoprolol tartrate (LOPRESSOR) 25 MG tablet Take 1 tablet (25 mg total) by mouth 2 (two) times daily. 60 tablet 6   nitroGLYCERIN (NITROSTAT) 0.4 MG SL tablet Place 0.4 mg under the tongue every 5 (five) minutes as needed for chest pain. Up to three time and then call the doctor  simvastatin (ZOCOR) 20 MG tablet TAKE 1 TABLET(20 MG) BY MOUTH AT BEDTIME 90 tablet 1   sulfamethoxazole-trimethoprim (BACTRIM DS,SEPTRA DS) 800-160 MG tablet Take 1 tablet by mouth every Monday, Wednesday, and Friday.  5   tadalafil (CIALIS) 20 MG tablet Take 1 tablet (20 mg total) by mouth daily as needed for erectile dysfunction (no nitroglycerin within 72 hours). 10 tablet 5   valACYclovir (VALTREX) 500 MG tablet Take 1 tablet (500 mg total) by mouth as needed. (Patient taking differently: Take 500 mg by mouth 3 (three) times daily.) 60 tablet 3   traMADol (ULTRAM) 50 MG tablet Take 1 tablet (50 mg total) by mouth every 12 (twelve) hours as needed for moderate pain or severe pain (from psoriatic arthritis. do not drive for 8 hours after taking). 60 tablet 5    No current facility-administered medications for this visit.     Objective:  BP 124/72   Pulse 89   Temp 98.3 F (36.8 C)   Ht '5\' 11"'$  (1.803 m)   Wt 179 lb 12.8 oz (81.6 kg)   SpO2 99%   BMI 25.08 kg/m  Gen: NAD, resting comfortably CV: RRR no murmurs rubs or gallops Lungs: CTAB no crackles, wheeze, rhonchi Ext: no edema Skin: warm, dry     Assessment and Plan   # Prior hallucinations-resolved after stopping metoprolol.  He was also concerned B12 could have been contributing.  Had also been on Wellbutrin which could have contributed  # Psoriatic arthritis-follows with rheumatology Dr. Amil Amen # Immunodeficiency #chronic pain S: Medication: Remicade with labs every 16 weeks  - tramadol prior through rheumatology- primary care to start in 2024* -Also followed by [facility] infectious disease for immunodeficiency on Bactrim 3 days a week and as needed clindamycin as well as Diflucan -he also used clindamycin for abscessed tooth- went down but has broken off tooth and will refill through [facility] and schedule with dentist A/P: psoriatic arthritis stable but with ongoing chronic pain. Continue remicade but I will take over tramadol prescription -for immunodeficiency issues- continue follow up with [facility]- last seen 04/04/21 now due- encouraged him to call to schedule   # CAD-follows with cardiology #hyperlipidemia S: Medication:Aspirin 81 mg, simvastatin 20 mg, nitroglycerin on hand but has not had to use Symptoms:no chest pain or shortness of breath   Lab Results  Component Value Date   CHOL 95 06/13/2021   HDL 33.60 (L) 06/13/2021   LDLCALC 36 06/13/2021   TRIG 124.0 06/13/2021   CHOLHDL 3 06/13/2021   A/P: lipids have been at goal- not quite due for repeat- recheck next visit  # Diabetes- a1c goal 7.5 or even 8 S: Medication: glimepiride 4 mg, januvia 50 mg, metformin '1000mg'$  ER in AM CBGs- 121 this am Exercise and diet- not exercising regularly- encouraged  A/P: hopefully  stable- update a1c today. Continue current meds for now  #hypertension # Chronic kidney disease stage III with history of hyperkalemia S: medication: Amlodipine 10 mg -GFR typically in 30s or 40s -Has not tolerated ACE inhibitor in the past with hyperkalemia issues  Home readings #s: usually 120s/70s A/P: both issues stable- continue current medicines   # Depression S: Medication: wellbutrin in past- now off A/P: doing well off meds- continue to monitor  # B12 deficiency S: Current treatment/medication (oral vs. IM):  he stopped worried could cause hallucinations (more strongly suspect metoprolol) A/P: hopefully stable with no b12-  update b12 today. Continue current meds for now   #allergies- intermittent zyrtec/flonase in  past- states taking benadryl seems really helpful but warned about oversedation risk and retry zyrtec. Some upper chest congestion- one again hopeful that zyrtec will help- he also states already improving  #thrombocytosis- chronic- we previously called 04/04/21-Dr. Philis Kendall- less worried about thrombocytosis unless regularly over 1k. can do e consult for oncology if over 1k again -wants him to have prevnar 20- he declined 06/13/21 and again today 03/19/22 -opts out of covid despite risks    Recommended follow up: No follow-ups on file. Future Appointments  Date Time Provider [place]  07/26/2022 10:20 AM Satira Sark, MD [facility]  01/17/2023  7:45 AM [facility] HEALTH COACH [facility]    Lab/Order associations:   ICD-10-CM   1. Controlled type 2 diabetes mellitus with retinopathy, without long-term current use of insulin, macular edema presence unspecified, unspecified laterality, unspecified retinopathy severity (HCC)  E11.319 Hemoglobin A1c    2. Psoriatic arthritis ([place])  L40.50     3. B12 deficiency  E53.8     4. 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) Chronic D84.89     5. Recurrent major depressive  disorder, in full remission ([place]) Chronic F33.42       Meds ordered this encounter  Medications   traMADol (ULTRAM) 50 MG tablet    Sig: Take 1 tablet (50 mg total) by mouth every 12 (twelve) hours as needed for moderate pain or severe pain (from psoriatic arthritis. do not drive for 8 hours after taking).    Dispense:  60 tablet    Refill:  5    Return precautions advised.  Garret Reddish, MD

## 2022-03-19 NOTE — Telephone Encounter (Signed)
High potassium of 6.5 (was not hemolyzed).

## 2022-03-19 NOTE — Addendum Note (Signed)
Addended by: Clyde Lundborg A on: 03/19/2022 03:23 PM   Modules accepted: Orders

## 2022-03-19 NOTE — Patient Instructions (Addendum)
Please stop by lab before you go If you have mychart- we will send your results within 3 business days of [place] receiving them.  If you do not have mychart- we will call you about results within 5 business days of [place] receiving them.  *please also note that you will see labs on mychart as soon as they post. I will later go in and write notes on them- will say "notes from Dr. Yong Channel"   Recommended follow up: Return in about 4 months (around 07/18/2022) for physical or sooner if needed.Schedule b4 you leave.

## 2022-03-20 ENCOUNTER — Other Ambulatory Visit (INDEPENDENT_AMBULATORY_CARE_PROVIDER_SITE_OTHER): Payer: Medicare PPO

## 2022-03-20 ENCOUNTER — Other Ambulatory Visit: Payer: Self-pay

## 2022-03-20 DIAGNOSIS — E875 Hyperkalemia: Secondary | ICD-10-CM

## 2022-03-20 LAB — BASIC METABOLIC PANEL
BUN: 22 mg/dL (ref 6–23)
CO2: 24 mEq/L (ref 19–32)
Calcium: 9.5 mg/dL (ref 8.4–10.5)
Chloride: 103 mEq/L (ref 96–112)
Creatinine, Ser: 1.66 mg/dL — ABNORMAL HIGH (ref 0.40–1.50)
GFR: 39.64 mL/min — ABNORMAL LOW (ref >60.00)
Glucose, Bld: 199 mg/dL — ABNORMAL HIGH (ref 70–99)
Potassium: 4.9 mEq/L (ref 3.5–5.1)
Sodium: 137 mEq/L (ref 135–145)

## 2022-03-20 LAB — MICROALBUMIN / CREATININE URINE RATIO
Creatinine,U: 59 mg/dL
Microalb Creat Ratio: 2.8 mg/g (ref 0.0–30.0)
Microalb, Ur: 1.7 mg/dL (ref 0.0–1.9)

## 2022-03-20 NOTE — Telephone Encounter (Signed)
Called and spoke with pt, labs reviewed,. Pt going to [facility] for redraw.

## 2022-03-22 ENCOUNTER — Other Ambulatory Visit: Payer: Self-pay | Admitting: Family Medicine

## 2022-03-22 DIAGNOSIS — E7800 Pure hypercholesterolemia, unspecified: Secondary | ICD-10-CM

## 2022-04-06 ENCOUNTER — Other Ambulatory Visit: Payer: Self-pay | Admitting: Family Medicine

## 2022-04-10 DIAGNOSIS — H25093 Other age-related incipient cataract, bilateral: Secondary | ICD-10-CM | POA: Diagnosis not present

## 2022-04-10 DIAGNOSIS — H401131 Primary open-angle glaucoma, bilateral, mild stage: Secondary | ICD-10-CM | POA: Diagnosis not present

## 2022-04-10 DIAGNOSIS — E113293 Type 2 diabetes mellitus with mild nonproliferative diabetic retinopathy without macular edema, bilateral: Secondary | ICD-10-CM | POA: Diagnosis not present

## 2022-04-17 DIAGNOSIS — Z79899 Other long term (current) drug therapy: Secondary | ICD-10-CM | POA: Diagnosis not present

## 2022-04-17 DIAGNOSIS — R5383 Other fatigue: Secondary | ICD-10-CM | POA: Diagnosis not present

## 2022-04-17 DIAGNOSIS — L4050 Arthropathic psoriasis, unspecified: Secondary | ICD-10-CM | POA: Diagnosis not present

## 2022-05-02 ENCOUNTER — Telehealth: Payer: Self-pay

## 2022-05-02 NOTE — Patient Outreach (Signed)
  Care Coordination   Initial Visit Note   05/02/2022 Name: Mikhal Wickwire. MRN: [id number] DOB: Oct 03, 1945  Orson Ape Yandriel Behm. is a 77 y.o. year old male who sees Hunter, Brayton Mars, MD for primary care. I spoke with  [name] by phone today.  What matters to the patients health and wellness today?  none    Goals Addressed             This Visit's Progress    COMPLETED: Care Coordination Activities-No follow up required       Interventions Today    Flowsheet Row Most Recent Value  Chronic Disease   Chronic disease during today's visit Diabetes  General Interventions   General Interventions Discussed/Reviewed General Interventions Discussed, Health Screening, Doctor Visits  Doctor Visits Discussed/Reviewed Doctor Visits Discussed  Education Interventions   Education Provided Provided Education  Provided Verbal Education On Nutrition  Nutrition Interventions   Nutrition Discussed/Reviewed Nutrition Discussed, Decreasing sugar intake, Carbohydrate meal planning              SDOH assessments and interventions completed:  Yes  SDOH Interventions Today    Flowsheet Row Most Recent Value  SDOH Interventions   Food Insecurity Interventions Intervention Not Indicated  Housing Interventions Intervention Not Indicated        Care Coordination Interventions:  Yes, provided   Follow up plan: No further intervention required.   Encounter Outcome:  Pt. Visit Completed   Jone Baseman, RN, MSN [place] [facility] Care Management Coordinator Direct Line [phone]

## 2022-05-02 NOTE — Patient Instructions (Signed)
Visit Information  Thank you for taking time to visit with me today. Please don't hesitate to contact me if I can be of assistance to you.   Following are the goals we discussed today:   Goals Addressed             This Visit's Progress    COMPLETED: Care Coordination Activities-No follow up required       Interventions Today    Flowsheet Row Most Recent Value  Chronic Disease   Chronic disease during today's visit Diabetes  General Interventions   General Interventions Discussed/Reviewed General Interventions Discussed, Health Screening, Doctor Visits  Doctor Visits Discussed/Reviewed Doctor Visits Discussed  Education Interventions   Education Provided Provided Education  Provided Verbal Education On Nutrition  Nutrition Interventions   Nutrition Discussed/Reviewed Nutrition Discussed, Decreasing sugar intake, Carbohydrate meal planning               If you are experiencing a Mental Health or [place] or need someone to talk to, please call the Suicide and Crisis Lifeline: 988   Patient verbalizes understanding of instructions and care plan provided today and agrees to view in [place]. Active MyChart status and patient understanding of how to access instructions and care plan via MyChart confirmed with patient.     No further follow up required: decline  Jone Baseman, RN, MSN [place] [facility] Care Management Coordinator Direct Line [phone]

## 2022-05-06 ENCOUNTER — Other Ambulatory Visit: Payer: Self-pay | Admitting: Family Medicine

## 2022-05-22 DIAGNOSIS — E113293 Type 2 diabetes mellitus with mild nonproliferative diabetic retinopathy without macular edema, bilateral: Secondary | ICD-10-CM | POA: Diagnosis not present

## 2022-05-22 DIAGNOSIS — H401131 Primary open-angle glaucoma, bilateral, mild stage: Secondary | ICD-10-CM | POA: Diagnosis not present

## 2022-05-22 DIAGNOSIS — H25093 Other age-related incipient cataract, bilateral: Secondary | ICD-10-CM | POA: Diagnosis not present

## 2022-06-12 DIAGNOSIS — L4050 Arthropathic psoriasis, unspecified: Secondary | ICD-10-CM | POA: Diagnosis not present

## 2022-06-12 DIAGNOSIS — Z79899 Other long term (current) drug therapy: Secondary | ICD-10-CM | POA: Diagnosis not present

## 2022-06-14 ENCOUNTER — Other Ambulatory Visit: Payer: Self-pay

## 2022-06-14 MED ORDER — FLUTICASONE PROPIONATE 50 MCG/ACT NA SUSP: 2.0000 | Freq: Every day | NASAL | 3 refills | Status: DC

## 2022-06-17 ENCOUNTER — Other Ambulatory Visit: Payer: Self-pay | Admitting: Family Medicine

## 2022-06-18 MED ORDER — SITAGLIPTIN PHOSPHATE 50 MG PO TABS: 50.0000 mg | ORAL_TABLET | Freq: Every day | ORAL | 0 refills | Status: DC

## 2022-06-18 MED ORDER — GLIMEPIRIDE 4 MG PO TABS: 4.0000 mg | ORAL_TABLET | Freq: Every day | ORAL | 0 refills | Status: DC

## 2022-07-17 ENCOUNTER — Encounter: Payer: Self-pay | Admitting: Family Medicine

## 2022-07-17 ENCOUNTER — Ambulatory Visit (INDEPENDENT_AMBULATORY_CARE_PROVIDER_SITE_OTHER): Payer: Medicare PPO | Admitting: Family Medicine

## 2022-07-17 VITALS — BP 138/78 | HR 66 | Temp 97.7°F | Ht 71.0 in | Wt 175.0 lb

## 2022-07-17 DIAGNOSIS — E785 Hyperlipidemia, unspecified: Secondary | ICD-10-CM | POA: Diagnosis not present

## 2022-07-17 DIAGNOSIS — Z0000 Encounter for general adult medical examination without abnormal findings: Secondary | ICD-10-CM | POA: Diagnosis not present

## 2022-07-17 DIAGNOSIS — N183 Chronic kidney disease, stage 3 unspecified: Secondary | ICD-10-CM | POA: Diagnosis not present

## 2022-07-17 DIAGNOSIS — E1169 Type 2 diabetes mellitus with other specified complication: Secondary | ICD-10-CM | POA: Diagnosis not present

## 2022-07-17 DIAGNOSIS — L4050 Arthropathic psoriasis, unspecified: Secondary | ICD-10-CM | POA: Diagnosis not present

## 2022-07-17 DIAGNOSIS — D75839 Thrombocytosis, unspecified: Secondary | ICD-10-CM | POA: Diagnosis not present

## 2022-07-17 DIAGNOSIS — I152 Hypertension secondary to endocrine disorders: Secondary | ICD-10-CM

## 2022-07-17 DIAGNOSIS — F3342 Major depressive disorder, recurrent, in full remission: Secondary | ICD-10-CM | POA: Diagnosis not present

## 2022-07-17 DIAGNOSIS — E1159 Type 2 diabetes mellitus with other circulatory complications: Secondary | ICD-10-CM | POA: Diagnosis not present

## 2022-07-17 DIAGNOSIS — E11319 Type 2 diabetes mellitus with unspecified diabetic retinopathy without macular edema: Secondary | ICD-10-CM

## 2022-07-17 DIAGNOSIS — Z87891 Personal history of nicotine dependence: Secondary | ICD-10-CM

## 2022-07-17 LAB — CBC WITH DIFFERENTIAL/PLATELET
Basophils Absolute: 0.1 10*3/uL (ref 0.0–0.1)
Basophils Relative: 1.3 % (ref 0.0–3.0)
Eosinophils Absolute: 0.3 10*3/uL (ref 0.0–0.7)
Eosinophils Relative: 3.8 % (ref 0.0–5.0)
HCT: 46.6 % (ref 39.0–52.0)
Hemoglobin: 15.9 g/dL (ref 13.0–17.0)
Lymphocytes Relative: 8.6 % — ABNORMAL LOW (ref 12.0–46.0)
Lymphs Abs: 0.7 10*3/uL (ref 0.7–4.0)
MCHC: 34.2 g/dL (ref 30.0–36.0)
MCV: 83.2 fl (ref 78.0–100.0)
Monocytes Absolute: 0.6 10*3/uL (ref 0.1–1.0)
Monocytes Relative: 7.8 % (ref 3.0–12.0)
Neutro Abs: 6.4 10*3/uL (ref 1.4–7.7)
Neutrophils Relative %: 78.5 % — ABNORMAL HIGH (ref 43.0–77.0)
Platelets: 963 10*3/uL — ABNORMAL HIGH (ref 150.0–400.0)
RBC: 5.59 Mil/uL (ref 4.22–5.81)
RDW: 14.7 % (ref 11.5–15.5)
WBC: 8.2 10*3/uL (ref 4.0–10.5)

## 2022-07-17 LAB — URINALYSIS, ROUTINE W REFLEX MICROSCOPIC
Bilirubin Urine: NEGATIVE
Hgb urine dipstick: NEGATIVE
Ketones, ur: NEGATIVE
Leukocytes,Ua: NEGATIVE
Nitrite: NEGATIVE
RBC / HPF: NONE SEEN (ref 0–?)
Specific Gravity, Urine: 1.01 (ref 1.000–1.030)
Total Protein, Urine: NEGATIVE
Urine Glucose: NEGATIVE
Urobilinogen, UA: 0.2 (ref 0.0–1.0)
pH: 6 (ref 5.0–8.0)

## 2022-07-17 LAB — LIPID PANEL
Cholesterol: 85 mg/dL (ref 0–200)
HDL: 29.3 mg/dL — ABNORMAL LOW (ref >39.00)
LDL Cholesterol: 33 mg/dL (ref 0–99)
NonHDL: 56.07
Total CHOL/HDL Ratio: 3
Triglycerides: 117 mg/dL (ref 0.0–149.0)
VLDL: 23.4 mg/dL (ref 0.0–40.0)

## 2022-07-17 LAB — COMPREHENSIVE METABOLIC PANEL
ALT: 16 U/L (ref 0–53)
AST: 17 U/L (ref 0–37)
Albumin: 4.4 g/dL (ref 3.5–5.2)
Alkaline Phosphatase: 49 U/L (ref 39–117)
BUN: 20 mg/dL (ref 6–23)
CO2: 25 mEq/L (ref 19–32)
Calcium: 10 mg/dL (ref 8.4–10.5)
Chloride: 100 mEq/L (ref 96–112)
Creatinine, Ser: 1.73 mg/dL — ABNORMAL HIGH (ref 0.40–1.50)
GFR: 37.63 mL/min — ABNORMAL LOW (ref >60.00)
Glucose, Bld: 217 mg/dL — ABNORMAL HIGH (ref 70–99)
Potassium: 5.7 mEq/L — ABNORMAL HIGH (ref 3.5–5.1)
Sodium: 133 mEq/L — ABNORMAL LOW (ref 135–145)
Total Bilirubin: 0.7 mg/dL (ref 0.2–1.2)
Total Protein: 6.7 g/dL (ref 6.0–8.3)

## 2022-07-17 LAB — HEMOGLOBIN A1C: Hgb A1c MFr Bld: 8.3 % — ABNORMAL HIGH (ref 4.6–6.5)

## 2022-07-17 MED ORDER — SITAGLIPTIN PHOSPHATE 50 MG PO TABS: 50.0000 mg | ORAL_TABLET | Freq: Every day | ORAL | 3 refills | Status: DC

## 2022-07-17 MED ORDER — NITROGLYCERIN 0.4 MG SL SUBL: 0.4000 mg | SUBLINGUAL_TABLET | SUBLINGUAL | 5 refills | Status: AC | PRN

## 2022-07-17 NOTE — Progress Notes (Signed)
Phone: [phone]   Subjective:  Patient presents today for their annual physical. Chief complaint-noted.   See problem oriented charting- ROS- full  review of systems was completed and negative  except for: ongoing fatigue, shoulder pain and othe rjoint pain including hands, allergies- not too bad this year with OTC (available over the counter without a prescription) medicines  The following were reviewed and entered/updated in epic: Past Medical History:  Diagnosis Date   Abnormality of immune system    Allergic rhinitis    Allergy    Anxiety    Atopic dermatitis    CAD (coronary artery disease)    Mild at cardiac catheterization 2004   CKD (chronic kidney disease) stage 3, GFR 30-59 ml/min (HCC)    Collagen vascular disease (HCC)    Erectile dysfunction    Essential hypertension    GERD (gastroesophageal reflux disease)    Hyperlipidemia    Myocardial infarction (HCC) 2004   Suspected plaque rupture/aborted inferior infarct 2004, nonobstructive CAD managed medically   Osteoarthritis    Psoriatic arthritis (HCC)    Shingles    Type 2 diabetes mellitus (HCC)    Patient Active Problem List   Diagnosis Date Noted   Thrombocytosis 04/03/2016    Priority: High   Depression 03/27/2015    Priority: High   Psoriatic arthritis (HCC) 01/25/2013    Priority: High   Mitral valve prolapse 12/25/2010    Priority: High   Leukocytopenia 11/24/2006    Priority: High   Controlled type 2 diabetes mellitus with ophthalmic complication (HCC) 10/06/2006    Priority: High   CAD (coronary artery disease) with history MI 2004- no intervention 10/06/2006    Priority: High   Mitral valve regurgitation 02/15/2019    Priority: Medium    Dyshidrotic eczema 02/07/2016    Priority: Medium    CKD (chronic kidney disease), stage III (HCC) 09/15/2015    Priority: Medium    History of BPH 12/14/2009    Priority: Medium    Hyperlipidemia associated with type 2 diabetes mellitus (HCC)  10/06/2006    Priority: Medium    Hypertension associated with diabetes (HCC) 10/06/2006    Priority: Medium    GERD (gastroesophageal reflux disease) 08/24/2019    Priority: Low   Diabetic retinopathy (HCC) 02/20/2017    Priority: Low   ERECTILE DYSFUNCTION 11/26/2007    Priority: Low   Candidiasis 11/25/2012   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) 11/19/2012   Past Surgical History:  Procedure Laterality Date   COLONOSCOPY     ESOPHAGOGASTRODUODENOSCOPY  04/23/2005   KNEE ARTHROSCOPY     right   POLYPECTOMY  2008   TA x 4    TONSILLECTOMY     TOOTH EXTRACTION     x 4 removed    UPPER GASTROINTESTINAL ENDOSCOPY      Family History  Problem Relation Age of Onset   Dementia Mother    Other Mother        abdominal issues led to death- apparently inoperable growth-  likely cancer   COPD Father        led to death   Colon polyps Father    Diabetes Brother    Colon cancer Neg Hx    Esophageal cancer Neg Hx    Rectal cancer Neg Hx    Stomach cancer Neg Hx     Medications- reviewed and updated Current Outpatient Medications  Medication Sig Dispense Refill   ACCU-CHEK AVIVA PLUS test strip USE TO TEST BLOOD  SUGAR EVERY DAY 100 strip 1   amLODipine (NORVASC) 10 MG tablet TAKE 1 TABLET BY MOUTH EVERY DAY 100 tablet 3   aspirin EC 81 MG tablet Take 81 mg by mouth daily.     clindamycin (CLEOCIN) 300 MG capsule Take 300 mg by mouth as needed.      famotidine (PEPCID) 20 MG tablet TAKE 1 TABLET(20 MG) BY MOUTH TWICE DAILY AS NEEDED FOR HEARTBURN OR INDIGESTION 60 tablet 11   fluocinonide ointment (LIDEX) 0.05 % APPLY TOPICALLY ONCE DAILY AS NEEDED 60 g 3   fluticasone (FLONASE) 50 MCG/ACT nasal spray Place 2 sprays into both nostrils daily. 16 g 3   glimepiride (AMARYL) 4 MG tablet Take 1 tablet (4 mg total) by mouth daily with breakfast. 90 tablet 0   glucose blood (ACCU-CHEK AVIVA PLUS) test strip USE TO TEST BLOOD SUGAR ONCE DAILY     InFLIXimab  (REMICADE IV) Inject into the vein every 8 (eight) weeks.      latanoprost (XALATAN) 0.005 % ophthalmic solution Place 1 drop into both eyes at bedtime.     metFORMIN (GLUCOPHAGE-XR) 500 MG 24 hr tablet TAKE 2 TABLETS BY MOUTH DAILY WITH BREAKFAST 180 tablet 3   nitroGLYCERIN (NITROSTAT) 0.4 MG SL tablet Place 0.4 mg under the tongue every 5 (five) minutes as needed for chest pain. Up to three time and then call the doctor     simvastatin (ZOCOR) 20 MG tablet TAKE 1 TABLET(20 MG) BY MOUTH AT BEDTIME 90 tablet 1   sitaGLIPtin (JANUVIA) 50 MG tablet Take 1 tablet (50 mg total) by mouth daily. 30 tablet 0   sulfamethoxazole-trimethoprim (BACTRIM DS,SEPTRA DS) 800-160 MG tablet Take 1 tablet by mouth every Monday, Wednesday, and Friday.  5   tadalafil (CIALIS) 20 MG tablet Take 1 tablet (20 mg total) by mouth daily as needed for erectile dysfunction (no nitroglycerin within 72 hours). 10 tablet 5   traMADol (ULTRAM) 50 MG tablet Take 1 tablet (50 mg total) by mouth every 12 (twelve) hours as needed for moderate pain or severe pain (from psoriatic arthritis. do not drive for 8 hours after taking). 60 tablet 5   valACYclovir (VALTREX) 500 MG tablet Take 1 tablet (500 mg total) by mouth as needed. (Patient taking differently: Take 500 mg by mouth 3 (three) times daily.) 60 tablet 3   fluconazole (DIFLUCAN) 100 MG tablet Take 100 mg by mouth daily as needed.  (Patient not taking: Reported on 07/17/2022)     No current facility-administered medications for this visit.    Allergies-reviewed and updated Allergies  Allergen Reactions   Amoxicillin     Significant diffuse skin rash/irritation   Codeine     REACTION: itching and rash Other reaction(s): rash   Desvenlafaxine     "funny feeling"   Lisinopril    Methotrexate Other (See Comments)    Other reaction(s): mouth sores   Penicillin G     Other reaction(s): rash   Penicillins     REACTION: as a child   Pristiq [Desvenlafaxine Succinate Er]      "funny feeling"    Social History   Social History Narrative   Married. 2 children. 2 stepchildren. 7 grandkids including step   Take care of daughter of law Devansh Kegel- son left her with 64 year old.       Retired after  35 years [employer]      Hobbies: golfer, gardening   Objective  Objective:  BP 138/78   Pulse  66   Temp 97.7 F (36.5 C)   Ht 5\' 11"  (1.803 m)   Wt 175 lb (79.4 kg)   SpO2 98%   BMI 24.41 kg/m  Gen: NAD, resting comfortably HEENT: Mucous membranes are moist. Oropharynx normal Neck: no thyromegaly CV: RRR holosystolic murmur heard best at left lower chest Lungs: CTAB no crackles, wheeze, rhonchi Abdomen: soft/nontender/nondistended/normal bowel sounds. No rebound or guarding.  Ext: no edema Skin: warm, dry Neuro: grossly normal, moves all extremities, PERRLA  Diabetic Foot Exam - Simple   Simple Foot Form Diabetic Foot exam was performed with the following findings: Yes 07/17/2022  8:43 AM  Visual Inspection See comments: Yes Sensation Testing See comments: Yes Pulse Check Posterior Tibialis and Dorsalis pulse intact bilaterally: Yes Comments Hyperkeratotic diffuse plaque beneath MTP joints bilaterally that and limited sensation related to this but otherwise normal- he declines doing anything different about this        Assessment and Plan  77 y.o. male presenting for annual physical.  Health Maintenance counseling: 1. Anticipatory guidance: Patient counseled regarding regular dental exams -q6 months, eye exams - q6 months glaucoma- now on drops in addition to having known retinopathy,  avoiding smoking and second hand smoke, limiting alcohol to 2 beverages per day - doesn't drink, no illicit drugs .   2. Risk factor reduction:  Advised patient of need for regular exercise and diet rich and fruits and vegetables to reduce risk of heart attack and stroke.  Exercise- walking dog regularly 6 days a week.  Diet/weight management-reasonably healthy  weight- down 5 lbs from last year and even more from November when he peaked at 185- reasonably healthy diet.  Wt Readings from Last 3 Encounters:  07/17/22 175 lb (79.4 kg)  03/19/22 179 lb 12.8 oz (81.6 kg)  01/11/22 185 lb (83.9 kg)  3. Immunizations/screenings/ancillary studies- recommend prevnar 20- declines today, declines COVID shot Immunization History  Administered Date(s) Administered   Fluad Quad(high Dose 65+) 11/23/2018, 12/07/2020   Influenza Split 01/23/2012, 11/19/2012   Influenza Whole 11/26/2007, 12/14/2009   Influenza, High Dose Seasonal PF 02/01/2014, 02/06/2015, 02/07/2016, 12/06/2016   Influenza, Seasonal, Injecte, Preservative Fre 01/05/2009   Influenza,inj,Quad PF,6+ Mos 11/19/2012, 12/30/2019, 12/05/2021   Influenza-Unspecified 01/05/2009, 02/01/2014, 02/06/2015, 02/07/2016, 12/06/2016, 11/27/2017, 11/23/2018   Moderna Sars-Covid-2 Vaccination 04/07/2019, 05/05/2019, 01/15/2020, 07/15/2020   Pneumococcal Conjugate-13 06/02/2013   Pneumococcal Polysaccharide-23 03/11/2004, 06/29/2009   Td 03/11/2002   Tetanus 03/10/2013   Zoster Recombinat (Shingrix) 06/10/2017, 10/01/2017  4. Prostate cancer screening- low risk prior trend and at his age we have discontinued checks unless urinary issues- nothing bothering him  Lab Results  Component Value Date   PSA 0.67 02/07/2016   PSA 0.54 02/06/2015   PSA 0.56 02/01/2014   5. Colon cancer screening - 08/23/2021 with plan for 3-year repeat 6. Skin cancer screening-no recent dermatology visits-has seen Dr. Margo Aye in the past.advised regular sunscreen use. Denies worrisome, changing, or new skin lesions.  7. Smoking associated screening (lung cancer screening, AAA screen 65-75, UA)-former smoker- 60 pack years but quit in 2003-2005.  CT scan of the abdomen without obvious AAA.  Will get urinalysis with labs.  Quit over 15 years ago so does not qualify for lung cancer screening 8. STD screening -only active with wife  Status of  chronic or acute concerns   # Prior hallucinations-resolved after stopping metoprolol.  He was also concerned taking B12 could have been contributing so he stopped.  Had also been on Wellbutrin which could have  contributed Lab Results  Component Value Date   VITAMINB12 236 03/19/2022  - low normal B12 but he wants to hold off on treatment  # Psoriatic arthritis-follows with rheumatology Dr. Dierdre Forth # Immunodeficiency S: Medication: Remicade every 8 weeks with labs every 16 weeks  -Also followed by [facility] infectious disease for immunodeficiency on Bactrim 3 days a week and as needed clindamycin as well as Diflucan A/P: psoriatic athritis with ongoing pain despite remicade- some thought of doing 6 weeks instead of 8 weeks. Continue tramadol for pain  # CAD-follows with cardiology #hyperlipidemia S: Medication:Aspirin 81 mg simvastatin 20 mg, nitroglycerin on hand but has not had to use Symptoms:no chest pain or shortness of breath    A/P: coronary artery disease asymptomatic - continue current medications   # Diabetes- a1c goal 7.5 or even 8 S: Medication: glimepiride 4 mg, januvia 50 mg, metformin 1000mg  ER in AM CBGs- occasionally checks-a round 140 Lab Results  Component Value Date   HGBA1C 7.7 (H) 03/19/2022   HGBA1C 7.7 (H) 06/13/2021   HGBA1C 7.3 (H) 12/07/2020  A/P: we are tolerating slightly high a1c- he prefers to stay off insulin and wants to work odiet/exercise- hopefully improved exercise is helping- check a1c   #hypertension # Chronic kidney disease stage III with history of hyperkalemia S: medication: Amlodipine 10 mg -GFR typically in 30s or 40s -Has not tolerated ACE inhibitor in the past with hyperkalemia issues  BP Readings from Last 3 Encounters:  07/17/22 138/78  03/19/22 124/72  08/23/21 (!) 112/56  A/P: high acceptable range today but usually runs better we opted not to change medicine  # Depression S: Medication: wellbutrin in past- now off. Phq9 of 2  today A/P: full remission despite being off medications- continue to monitor   # B12 deficiency S: Current treatment/medication (oral vs. IM):  he stopped worried could cause hallucinations (more strongly suspect metoprolol) A/P: hold of fon checking as doesn't tolerate treatment   #allergies- intermittent zyrtec/flonase*  #thrombocytosis- chronic- we previously called 04/04/21-Dr. Jossie Ng- less worried about thrombocytosis unless regularly over 1k. can do e consult for oncology if over 1k again  #medication(s) interaction- discussed if used Cialis within 72 hours not to use nitroglycerin (he has not used anytime recently though)  #mitral valve regurgitatoin- murmur heard today but has upcoming visit with cradiology on 17th and will be checked on echocardiogram in follow up with them  Recommended follow up: Return in about 4 months (around 11/17/2022) for followup or sooner if needed.Schedule b4 you leave. Future Appointments  Date Time Provider Department Center  07/26/2022 10:20 AM Jonelle Sidle, MD [facility]  01/17/2023  7:45 AM [facility] ANNUAL WELLNESS VISIT 1 [facility]   Lab/Order associations: fasting   ICD-10-CM   1. Preventative health care  Z00.00     2. Stage 3 chronic kidney disease, unspecified whether stage 3a or 3b CKD (HCC) Chronic N18.30     3. Thrombocytosis  D75.839     4. Psoriatic arthritis (HCC)  L40.50     5. Recurrent major depressive disorder, in full remission (HCC)  F33.42     6. Hyperlipidemia associated with type 2 diabetes mellitus (HCC)  E11.69    E78.5     7. Hypertension associated with diabetes (HCC)  E11.59    I15.2     8. Controlled type 2 diabetes mellitus with retinopathy, without long-term current use of insulin, macular edema presence unspecified, unspecified laterality, unspecified retinopathy severity (HCC)  E11.319  9. Former smoker  Z87.891       No orders of the defined types were placed in this  encounter.   Return precautions advised.  Tana Conch, MD

## 2022-07-17 NOTE — Patient Instructions (Addendum)
Let us know if you get any other COVID or pneumonia vaccines at your pharmacy.  #medication(s) interaction- discussed if used Cialis within 72 hours not to use nitroglycerin (he has not used anytime recently though)  Please stop by lab before you go If you have mychart- we will send your results within 3 business days of [place] receiving them.  If you do not have mychart- we will call you about results within 5 business days of [place] receiving them.  *please also note that you will see labs on mychart as soon as they post. I will later go in and write notes on them- will say "notes from Dr. Durene Cal"

## 2022-07-18 DIAGNOSIS — D75839 Thrombocytosis, unspecified: Secondary | ICD-10-CM | POA: Diagnosis not present

## 2022-07-18 DIAGNOSIS — M79645 Pain in left finger(s): Secondary | ICD-10-CM | POA: Diagnosis not present

## 2022-07-18 DIAGNOSIS — M1991 Primary osteoarthritis, unspecified site: Secondary | ICD-10-CM | POA: Diagnosis not present

## 2022-07-18 DIAGNOSIS — Z6824 Body mass index (BMI) 24.0-24.9, adult: Secondary | ICD-10-CM | POA: Diagnosis not present

## 2022-07-18 DIAGNOSIS — N183 Chronic kidney disease, stage 3 unspecified: Secondary | ICD-10-CM | POA: Diagnosis not present

## 2022-07-18 DIAGNOSIS — L4050 Arthropathic psoriasis, unspecified: Secondary | ICD-10-CM | POA: Diagnosis not present

## 2022-07-23 ENCOUNTER — Other Ambulatory Visit: Payer: Self-pay

## 2022-07-23 DIAGNOSIS — E875 Hyperkalemia: Secondary | ICD-10-CM

## 2022-07-25 ENCOUNTER — Telehealth: Payer: Self-pay | Admitting: Family Medicine

## 2022-07-25 ENCOUNTER — Other Ambulatory Visit (INDEPENDENT_AMBULATORY_CARE_PROVIDER_SITE_OTHER): Payer: Medicare PPO

## 2022-07-25 DIAGNOSIS — I10 Essential (primary) hypertension: Secondary | ICD-10-CM

## 2022-07-25 DIAGNOSIS — E875 Hyperkalemia: Secondary | ICD-10-CM

## 2022-07-25 LAB — BASIC METABOLIC PANEL
BUN: 21 mg/dL (ref 6–23)
CO2: 25 mEq/L (ref 19–32)
Calcium: 9.6 mg/dL (ref 8.4–10.5)
Chloride: 101 mEq/L (ref 96–112)
Creatinine, Ser: 1.71 mg/dL — ABNORMAL HIGH (ref 0.40–1.50)
GFR: 38.16 mL/min — ABNORMAL LOW (ref >60.00)
Glucose, Bld: 154 mg/dL — ABNORMAL HIGH (ref 70–99)
Potassium: 4.5 mEq/L (ref 3.5–5.1)
Sodium: 134 mEq/L — ABNORMAL LOW (ref 135–145)

## 2022-07-25 MED ORDER — AMLODIPINE BESYLATE 10 MG PO TABS
10.0000 mg | ORAL_TABLET | Freq: Every day | ORAL | 3 refills | Status: DC
Start: 2022-07-25 — End: 2023-05-05

## 2022-07-25 NOTE — Telephone Encounter (Signed)
Prescription Request  07/25/2022  LOV: 07/17/2022  What is the name of the medication or equipment?  amLODipine (NORVASC) 10 MG tablet  Have you contacted your pharmacy to request a refill? Yes   Which pharmacy would you like this sent to?  [id number] - [place], [place] - [address] [place] [address] Phone: [phone] Fax: [phone]    Patient notified that their request is being sent to the clinical staff for review and that they should receive a response within 2 business days.   Please advise at Mobile [phone] (mobile)

## 2022-07-25 NOTE — Telephone Encounter (Signed)
Refill sent to pharmacy.   

## 2022-07-25 NOTE — Progress Notes (Signed)
    Cardiology Office Note  Date: 07/26/2022   ID: Chase Calamity., DOB Jul 04, 1945, MRN [id number]  History of Present Illness: Chase Knuckles. is a 77 y.o. male last seen in March 2022 by Mr. Vincenza Hews NP.  He presents overdue for follow-up.  He states that he has been doing fairly well, no change in stamina, no exertional chest pain, NYHA class II dyspnea with typical activities.  He walks 2 miles at a time, 6 days a week.  No palpitations or syncope.  I reviewed his medications.  He continues to follow with Dr. Durene Cal for primary care.  ECG today shows sinus rhythm with prolonged PR interval and low voltage, leftward axis.  His last echocardiogram was in 2022, we discussed getting an updated study for reevaluation of his mitral regurgitation.  I reviewed his most recent lab work.  Physical Exam: VS:  BP 132/62   Pulse 64   Ht 5\' 11"  (1.803 m)   Wt 173 lb 12.8 oz (78.8 kg)   SpO2 98%   BMI 24.24 kg/m , BMI Body mass index is 24.24 kg/m.  Wt Readings from Last 3 Encounters:  07/26/22 173 lb 12.8 oz (78.8 kg)  07/17/22 175 lb (79.4 kg)  03/19/22 179 lb 12.8 oz (81.6 kg)    General: Patient appears comfortable at rest. HEENT: Conjunctiva and lids normal. Neck: Supple, no elevated JVP or carotid bruits. Lungs: Clear to auscultation, nonlabored breathing at rest. Cardiac: Regular rate and rhythm, no S3, 3/6 mid-late systolic murmur consistent with mitral regurgitation. Abdomen: Soft, nontender, bowel sounds present. Extremities: No pitting edema.  ECG:  An ECG dated 11/30/2019 was personally reviewed today and demonstrated:  Sinus rhythm with prolonged PR interval, nonspecific T wave changes.  Labwork: 07/17/2022: ALT 16; AST 17; Hemoglobin 15.9; Platelets 963.0 07/25/2022: BUN 21; Creatinine, Ser 1.71; Potassium 4.5; Sodium 134     Component Value Date/Time   CHOL 85 07/17/2022 0908   TRIG 117.0 07/17/2022 0908   HDL 29.30 (L) 07/17/2022 0908   CHOLHDL 3 07/17/2022 0908    VLDL 23.4 07/17/2022 0908   LDLCALC 33 07/17/2022 0908   LDLCALC 41 02/25/2020 0957   Other Studies Reviewed Today:  No interval cardiac testing for review today.  Assessment and Plan:  1.  CAD, mild nonobstructive disease documented at cardiac catheterization in 2004 in the setting of suspected plaque rupture event versus aborted inferior infarct.  Follow-up exercise Myoview in 2017 suggested inferior infarct scar but no active ischemia.  Echocardiogram in September 2022 revealed LVEF 55 to 60%.  He does not report any active angina at this time.  Continue aspirin and Zocor.  2.  Bileaflet mitral valve prolapse associated with moderate to severe mitral regurgitation as of echocardiogram in September 2022.  Heart murmur consistent with this.  We will obtain an updated echocardiogram for further evaluation.  3.  Essential hypertension.  He is on Norvasc, systolic in the 130s today.  No changes were made.  4.  CKD stage IIIb.  Recent creatinine 1.71.  5.  Mixed hyperlipidemia.  Continue Zocor, recent LDL 33.  Disposition:  Follow up  6 months.  Signed, Jonelle Sidle, M.D., F.A.C.C. [facility]

## 2022-07-26 ENCOUNTER — Encounter: Payer: Self-pay | Admitting: Cardiology

## 2022-07-26 ENCOUNTER — Ambulatory Visit: Payer: Medicare PPO | Attending: Cardiology | Admitting: Cardiology

## 2022-07-26 VITALS — BP 132/62 | HR 64 | Ht 71.0 in | Wt 173.8 lb

## 2022-07-26 DIAGNOSIS — E782 Mixed hyperlipidemia: Secondary | ICD-10-CM | POA: Diagnosis not present

## 2022-07-26 DIAGNOSIS — I25119 Atherosclerotic heart disease of native coronary artery with unspecified angina pectoris: Secondary | ICD-10-CM | POA: Diagnosis not present

## 2022-07-26 DIAGNOSIS — I10 Essential (primary) hypertension: Secondary | ICD-10-CM

## 2022-07-26 DIAGNOSIS — I341 Nonrheumatic mitral (valve) prolapse: Secondary | ICD-10-CM

## 2022-07-26 DIAGNOSIS — I340 Nonrheumatic mitral (valve) insufficiency: Secondary | ICD-10-CM | POA: Diagnosis not present

## 2022-07-26 NOTE — Patient Instructions (Addendum)
Medication Instructions:  Your physician recommends that you continue on your current medications as directed. Please refer to the Current Medication list given to you today.  Labwork: none  Testing/Procedures: Your physician has requested that you have an echocardiogram. Echocardiography is a painless test that uses sound waves to create images of your heart. It provides your doctor with information about the size and shape of your heart and how well your heart's chambers and valves are working. This procedure takes approximately one hour. There are no restrictions for this procedure. Please do NOT wear cologne, perfume, aftershave, or lotions (deodorant is allowed). Please arrive 15 minutes prior to your appointment time.  Follow-Up: Your physician recommends that you schedule a follow-up appointment in: 6 months  Any Other Special Instructions Will Be Listed Below (If Applicable).  If you need a refill on your cardiac medications before your next appointment, please call your pharmacy. 

## 2022-08-07 DIAGNOSIS — L4050 Arthropathic psoriasis, unspecified: Secondary | ICD-10-CM | POA: Diagnosis not present

## 2022-08-07 DIAGNOSIS — Z79899 Other long term (current) drug therapy: Secondary | ICD-10-CM | POA: Diagnosis not present

## 2022-08-29 ENCOUNTER — Other Ambulatory Visit: Payer: Self-pay | Admitting: Family Medicine

## 2022-08-30 ENCOUNTER — Telehealth: Payer: Self-pay | Admitting: Family Medicine

## 2022-08-30 MED ORDER — ACCU-CHEK AVIVA PLUS VI STRP: ORAL_STRIP | 3 refills | Status: DC

## 2022-08-30 NOTE — Telephone Encounter (Signed)
Caller states they received Accu chek test strips but there were no instructions included. States they also need rx for lancets and monitor.

## 2022-08-30 NOTE — Addendum Note (Signed)
Addended by: Judd Gaudier on: 08/30/2022 02:43 PM   Modules accepted: Orders

## 2022-08-30 NOTE — Telephone Encounter (Signed)
Left message for patient to call back to clarify how often he is testing and if he needs lancets and monitor.

## 2022-08-30 NOTE — Telephone Encounter (Signed)
Spoke with patient.  He only needs test strips.  He only tests once a day.  Rx sent to [facility].  Patient aware.

## 2022-09-04 ENCOUNTER — Other Ambulatory Visit: Payer: Self-pay

## 2022-09-04 MED ORDER — ACCU-CHEK AVIVA PLUS VI STRP: ORAL_STRIP | 3 refills | Status: AC

## 2022-09-09 ENCOUNTER — Other Ambulatory Visit: Payer: Self-pay

## 2022-09-09 MED ORDER — GLIMEPIRIDE 4 MG PO TABS: 4.0000 mg | ORAL_TABLET | Freq: Every day | ORAL | 3 refills | Status: DC

## 2022-09-11 ENCOUNTER — Ambulatory Visit (HOSPITAL_COMMUNITY)
Admission: RE | Admit: 2022-09-11 | Discharge: 2022-09-11 | Disposition: A | Discharge status: Home / Self Care | Payer: Medicare PPO | Source: Ambulatory Visit | Attending: Cardiology | Admitting: Cardiology

## 2022-09-11 DIAGNOSIS — I340 Nonrheumatic mitral (valve) insufficiency: Secondary | ICD-10-CM | POA: Insufficient documentation

## 2022-09-11 LAB — ECHOCARDIOGRAM COMPLETE
AR max vel: 1.65 cm2
AV Area VTI: 1.78 cm2
AV Area mean vel: 1.73 cm2
AV Mean grad: 4.7 mmHg
AV Peak grad: 10.6 mmHg
Ao pk vel: 1.63 m/s
Area-P 1/2: 3.17 cm2
MV M vel: 5.33 m/s
MV Peak grad: 113.6 mmHg
MV VTI: 1.95 cm2
P 1/2 time: 513 msec
Radius: 0.2 cm
S' Lateral: 3.4 cm

## 2022-09-11 NOTE — Progress Notes (Signed)
  Echocardiogram 2D Echocardiogram has been performed.  Maren Reamer 09/11/2022, 12:11 PM

## 2022-09-13 ENCOUNTER — Other Ambulatory Visit: Payer: Self-pay | Admitting: Family Medicine

## 2022-09-20 ENCOUNTER — Other Ambulatory Visit: Payer: Self-pay | Admitting: Family Medicine

## 2022-09-20 DIAGNOSIS — E7800 Pure hypercholesterolemia, unspecified: Secondary | ICD-10-CM

## 2022-09-24 DIAGNOSIS — D75839 Thrombocytosis, unspecified: Secondary | ICD-10-CM | POA: Diagnosis not present

## 2022-09-24 DIAGNOSIS — N183 Chronic kidney disease, stage 3 unspecified: Secondary | ICD-10-CM | POA: Diagnosis not present

## 2022-09-24 DIAGNOSIS — M1991 Primary osteoarthritis, unspecified site: Secondary | ICD-10-CM | POA: Diagnosis not present

## 2022-09-24 DIAGNOSIS — L4050 Arthropathic psoriasis, unspecified: Secondary | ICD-10-CM | POA: Diagnosis not present

## 2022-09-24 DIAGNOSIS — M79645 Pain in left finger(s): Secondary | ICD-10-CM | POA: Diagnosis not present

## 2022-09-24 DIAGNOSIS — Z6824 Body mass index (BMI) 24.0-24.9, adult: Secondary | ICD-10-CM | POA: Diagnosis not present

## 2022-10-02 DIAGNOSIS — L4050 Arthropathic psoriasis, unspecified: Secondary | ICD-10-CM | POA: Diagnosis not present

## 2022-10-02 DIAGNOSIS — Z79899 Other long term (current) drug therapy: Secondary | ICD-10-CM | POA: Diagnosis not present

## 2022-10-02 DIAGNOSIS — R5383 Other fatigue: Secondary | ICD-10-CM | POA: Diagnosis not present

## 2022-10-03 ENCOUNTER — Other Ambulatory Visit: Payer: Self-pay | Admitting: Family Medicine

## 2022-10-06 ENCOUNTER — Other Ambulatory Visit: Payer: Self-pay | Admitting: Cardiology

## 2022-10-10 ENCOUNTER — Telehealth: Payer: Self-pay | Admitting: Cardiology

## 2022-10-10 NOTE — Telephone Encounter (Signed)
*  STAT* If patient is at the pharmacy, call can be transferred to refill team.   1. Which medications need to be refilled? (please list name of each medication and dose if known) Metoprolol   2. Would you like to learn more about the convenience, safety, & potential cost savings by using the [facility]?     3. Are you open to using the [facility] (Type [facility]. 4. Which pharmacy/location (including street and city if local pharmacy) is medication to be sent to?[facility] [address] [place], [place]   5. Do they need a 30 day or 90 day supply? 90 days and refills- please call in today- he is out of his medicine

## 2022-10-11 ENCOUNTER — Telehealth: Payer: Self-pay | Admitting: Cardiology

## 2022-10-11 ENCOUNTER — Other Ambulatory Visit: Payer: Self-pay | Admitting: Cardiology

## 2022-10-11 NOTE — Telephone Encounter (Signed)
Pt c/o medication issue:  1. Name of Medication:  Metoprolol   2. How are you currently taking this medication (dosage and times per day)?   3. Are you having a reaction (difficulty breathing--STAT)? No  4. What is your medication issue? Patient has been taking this medication and was wanting a refilled. The refill requested was denied due to the medication being discontinued. Patient is requesting we call him with answer to why the medication was discontinued. Please advise.

## 2022-10-11 NOTE — Telephone Encounter (Signed)
Refill not appropriate medication discontinued

## 2022-10-11 NOTE — Telephone Encounter (Signed)
Advised that metoprolol was removed by PCP 07/17/2022 due to resolving of hallucinations after stopping it. Reports he has been taking metoprolol tartrate 25 mg BID for 3-4 days and has never stopped it. Advised message would be sent to provider to approve adding it back to regimen. Verbalized understanding.

## 2022-10-11 NOTE — Telephone Encounter (Signed)
Per office 07/17/2022 OV with PCP Weston Brass hallucinations-resolved after stopping metoprolol

## 2022-10-14 ENCOUNTER — Telehealth: Payer: Self-pay | Admitting: Family Medicine

## 2022-10-14 NOTE — Telephone Encounter (Signed)
Patient's wife Dewayne Hatch requests to be called at ph# [phone] re: Heart Medication

## 2022-10-14 NOTE — Telephone Encounter (Signed)
Patient informed and verbalized understanding of plan. 

## 2022-10-14 NOTE — Telephone Encounter (Signed)
I never specifically set for him to stop the medicine-I instructed him to reduce the dose but he decided to stop the medicine completely  I am opening to him retrialing metoprolol if needed through cardiology but should let us know if recurrent symptoms

## 2022-10-14 NOTE — Telephone Encounter (Signed)
Pt wife called and states that Metoprolol had been taken off pt list and Dr. Diona Browner informed them that you had stopped pt metoprolol due to hallucinations (in the last ov note it only states that he had no more hallucinations after stopping Metoprolol). Pt wife states Dr. Diona Browner was going to send you a message to see what the reason was for you stopping it and he feels it ok for pt to be back on it.

## 2022-10-15 MED ORDER — METOPROLOL TARTRATE 25 MG PO TABS: 25.0000 mg | ORAL_TABLET | Freq: Two times a day (BID) | ORAL | 3 refills | Status: DC

## 2022-10-15 NOTE — Telephone Encounter (Addendum)
From visit 06/13/21 "hallucinations resolved since last visit- he came off metoprolol though plan was to only go to half tablet or 12.5 mg twice daily. He thinks it may be from stopping b12 being helpful- but he was having b12 issues being low when this started. Regardless resolved- im ok staying off metoprolol and b12 for now- will check b12 and make sure not low again. Also came off wellbutrin which may have helped "  Patient stopped the metoprolol supposedly on his own as of April 2023 but today he is reporting that he in fact never stopped the medication-he states he has a bottle and has been on 25 mg twice daily for as long as he can remember.  His heart rate has been normal on the medication and he has been hallucination free-for that reason I went ahead and restarted the medicine on his medication list to reflect what he is taking- obviously we can reassess if recurrent issues in the future-I asked him to let me know if hallucinations recur

## 2022-10-15 NOTE — Telephone Encounter (Signed)
I messaged cardiology on their thread  From visit 06/13/21 "hallucinations resolved since last visit- he came off metoprolol though plan was to only go to half tablet or 12.5 mg twice daily. He thinks it may be from stopping b12 being helpful- but he was having b12 issues being low when this started. Regardless resolved- im ok staying off metoprolol and b12 for now- will check b12 and make sure not low again. Also came off wellbutrin which may have helped "  Patient stopped the metoprolol supposedly on his own as of April 2023 but today he is reporting that he in fact never stopped the medication-he states he has a bottle and has been on 25 mg twice daily for as long as he can remember.  His heart rate has been normal on the medication and he has been hallucination free-for that reason I went ahead and restarted the medicine on his medication list to reflect what he is taking- obviously we can reassess if recurrent issues in the future-I asked him to let me know if hallucinations recur

## 2022-10-15 NOTE — Addendum Note (Signed)
Addended by: Shelva Majestic on: 10/15/2022 12:58 PM   Modules accepted: Orders

## 2022-10-15 NOTE — Telephone Encounter (Signed)
Called and lm for pt tcb. 

## 2022-10-15 NOTE — Telephone Encounter (Signed)
Called and spoke with pt and he states the cardiologist is ok with him restarting it but directed you to be the one to send in the Rx. Pt states he never had any symptoms on this medication and does not know where you/cardiology got this information from. Pt would like for you talk to his cardiologist directly and get a plan regarding this because the pt is now confused and getting the run around. Pt does not want a cb he just wants his medication sent in after you and the cardiologist have discussed this.

## 2022-10-24 ENCOUNTER — Encounter (INDEPENDENT_AMBULATORY_CARE_PROVIDER_SITE_OTHER): Payer: Self-pay

## 2022-10-30 DIAGNOSIS — N1832 Chronic kidney disease, stage 3b: Secondary | ICD-10-CM | POA: Diagnosis not present

## 2022-10-30 DIAGNOSIS — Z9189 Other specified personal risk factors, not elsewhere classified: Secondary | ICD-10-CM | POA: Diagnosis not present

## 2022-10-30 DIAGNOSIS — D849 Immunodeficiency, unspecified: Secondary | ICD-10-CM | POA: Diagnosis not present

## 2022-10-30 DIAGNOSIS — Z5181 Encounter for therapeutic drug level monitoring: Secondary | ICD-10-CM | POA: Diagnosis not present

## 2022-10-30 DIAGNOSIS — D75839 Thrombocytosis, unspecified: Secondary | ICD-10-CM | POA: Diagnosis not present

## 2022-11-05 DIAGNOSIS — Z6824 Body mass index (BMI) 24.0-24.9, adult: Secondary | ICD-10-CM | POA: Diagnosis not present

## 2022-11-05 DIAGNOSIS — L4050 Arthropathic psoriasis, unspecified: Secondary | ICD-10-CM | POA: Diagnosis not present

## 2022-11-05 DIAGNOSIS — D75839 Thrombocytosis, unspecified: Secondary | ICD-10-CM | POA: Diagnosis not present

## 2022-11-05 DIAGNOSIS — M79645 Pain in left finger(s): Secondary | ICD-10-CM | POA: Diagnosis not present

## 2022-11-05 DIAGNOSIS — M1991 Primary osteoarthritis, unspecified site: Secondary | ICD-10-CM | POA: Diagnosis not present

## 2022-11-05 DIAGNOSIS — R5382 Chronic fatigue, unspecified: Secondary | ICD-10-CM | POA: Diagnosis not present

## 2022-11-05 DIAGNOSIS — N183 Chronic kidney disease, stage 3 unspecified: Secondary | ICD-10-CM | POA: Diagnosis not present

## 2022-11-12 ENCOUNTER — Ambulatory Visit: Payer: Medicare PPO | Admitting: Family Medicine

## 2022-11-12 ENCOUNTER — Encounter: Payer: Self-pay | Admitting: Family Medicine

## 2022-11-12 ENCOUNTER — Telehealth: Payer: Self-pay

## 2022-11-12 VITALS — BP 136/70 | HR 88 | Temp 97.3°F | Ht 71.0 in | Wt 171.2 lb

## 2022-11-12 DIAGNOSIS — I10 Essential (primary) hypertension: Secondary | ICD-10-CM

## 2022-11-12 DIAGNOSIS — E11319 Type 2 diabetes mellitus with unspecified diabetic retinopathy without macular edema: Secondary | ICD-10-CM

## 2022-11-12 DIAGNOSIS — E785 Hyperlipidemia, unspecified: Secondary | ICD-10-CM

## 2022-11-12 DIAGNOSIS — E1169 Type 2 diabetes mellitus with other specified complication: Secondary | ICD-10-CM

## 2022-11-12 DIAGNOSIS — Z7984 Long term (current) use of oral hypoglycemic drugs: Secondary | ICD-10-CM

## 2022-11-12 LAB — COMPREHENSIVE METABOLIC PANEL
ALT: 18 U/L (ref 0–53)
AST: 24 U/L (ref 0–37)
Albumin: 4.4 g/dL (ref 3.5–5.2)
Alkaline Phosphatase: 45 U/L (ref 39–117)
BUN: 25 mg/dL — ABNORMAL HIGH (ref 6–23)
CO2: 21 meq/L (ref 19–32)
Calcium: 10.1 mg/dL (ref 8.4–10.5)
Chloride: 103 meq/L (ref 96–112)
Creatinine, Ser: 1.93 mg/dL — ABNORMAL HIGH (ref 0.40–1.50)
GFR: 32.93 mL/min — ABNORMAL LOW (ref >60.00)
Glucose, Bld: 136 mg/dL — ABNORMAL HIGH (ref 70–99)
Potassium: 5.1 meq/L (ref 3.5–5.1)
Sodium: 134 meq/L — ABNORMAL LOW (ref 135–145)
Total Bilirubin: 0.6 mg/dL (ref 0.2–1.2)
Total Protein: 7.2 g/dL (ref 6.0–8.3)

## 2022-11-12 LAB — CBC WITH DIFFERENTIAL/PLATELET
Basophils Absolute: 0.1 10*3/uL (ref 0.0–0.1)
Basophils Relative: 1.2 % (ref 0.0–3.0)
Eosinophils Absolute: 0.3 10*3/uL (ref 0.0–0.7)
Eosinophils Relative: 4.9 % (ref 0.0–5.0)
HCT: 47 % (ref 39.0–52.0)
Hemoglobin: 15.3 g/dL (ref 13.0–17.0)
Lymphocytes Relative: 8.5 % — ABNORMAL LOW (ref 12.0–46.0)
Lymphs Abs: 0.6 10*3/uL — ABNORMAL LOW (ref 0.7–4.0)
MCHC: 32.6 g/dL (ref 30.0–36.0)
MCV: 87.3 fl (ref 78.0–100.0)
Monocytes Absolute: 0.6 10*3/uL (ref 0.1–1.0)
Monocytes Relative: 8.7 % (ref 3.0–12.0)
Neutro Abs: 5.3 10*3/uL (ref 1.4–7.7)
Neutrophils Relative %: 76.7 % (ref 43.0–77.0)
Platelets: 1047 10*3/uL (ref 150.0–400.0)
RBC: 5.38 Mil/uL (ref 4.22–5.81)
RDW: 15.5 % (ref 11.5–15.5)
WBC: 6.9 10*3/uL (ref 4.0–10.5)

## 2022-11-12 LAB — HEMOGLOBIN A1C: Hgb A1c MFr Bld: 6.9 % — ABNORMAL HIGH (ref 4.6–6.5)

## 2022-11-12 NOTE — Patient Instructions (Addendum)
Flu shot today  Let us know when you decide to get your COVID vaccine at the pharmacy  Please stop by lab before you go If you have mychart- we will send your results within 3 business days of [place] receiving them.  If you do not have mychart- we will call you about results within 5 business days of [place] receiving them.  *please also note that you will see labs on mychart as soon as they post. I will later go in and write notes on them- will say "notes from Dr. Durene Cal"     Recommended follow up: Return in about 4 months (around 03/14/2023) for followup or sooner if needed.Schedule b4 you leave. And if you want could go ahead and schedule 8 month physical 1 year out from last physical

## 2022-11-12 NOTE — Telephone Encounter (Signed)
Hope from [place] lab called with critical platelet count of 1,047. Report verbally given to Dr. Durene Cal and pt will discuss with [facility].

## 2022-11-12 NOTE — Progress Notes (Signed)
Phone [phone] In person visit   Subjective:   Chase Anderson. is a 77 y.o. year old very pleasant male patient who presents for/with See problem oriented charting Chief Complaint  Patient presents with   Medical Management of Chronic Issues   Diabetes   Hypertension   Hyperlipidemia   Past Medical History-  Patient Active Problem List   Diagnosis Date Noted   Thrombocytosis 04/03/2016    Priority: High   Depression 03/27/2015    Priority: High   Psoriatic arthritis (HCC) 01/25/2013    Priority: High   Mitral valve prolapse 12/25/2010    Priority: High   Leukocytopenia 11/24/2006    Priority: High   Controlled type 2 diabetes mellitus with ophthalmic complication (HCC) 10/06/2006    Priority: High   CAD (coronary artery disease) with history MI 2004- no intervention 10/06/2006    Priority: High   Mitral valve regurgitation 02/15/2019    Priority: Medium    Dyshidrotic eczema 02/07/2016    Priority: Medium    CKD (chronic kidney disease), stage III (HCC) 09/15/2015    Priority: Medium    History of BPH 12/14/2009    Priority: Medium    Hyperlipidemia associated with type 2 diabetes mellitus (HCC) 10/06/2006    Priority: Medium    Essential hypertension 10/06/2006    Priority: Medium    GERD (gastroesophageal reflux disease) 08/24/2019    Priority: Low   Diabetic retinopathy (HCC) 02/20/2017    Priority: Low   ERECTILE DYSFUNCTION 11/26/2007    Priority: Low   Candidiasis 11/25/2012   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 100 tablet 3   aspirin EC 81 MG tablet Take 81 mg by mouth daily.     famotidine (PEPCID) 20 MG tablet TAKE 1 TABLET(20 MG) BY MOUTH TWICE DAILY AS NEEDED FOR HEARTBURN OR INDIGESTION 60 tablet 11   fluocinonide ointment  (LIDEX) 0.05 % APPLY TOPICALLY ONCE DAILY AS NEEDED 60 g 3   fluticasone (FLONASE) 50 MCG/ACT nasal spray SHAKE LIQUID AND USE 2 SPRAYS IN EACH NOSTRIL DAILY 16 g 3   glimepiride (AMARYL) 4 MG tablet Take 1 tablet (4 mg total) by mouth daily with breakfast. 90 tablet 3   glucose blood (ACCU-CHEK AVIVA PLUS) test strip USE TO TEST BLOOD SUGAR EVERY DAY 100 strip 3   glucose blood (ACCU-CHEK AVIVA PLUS) test strip Use to test blood sugars 2 times daily. Dx: E11.9 100 each 3   latanoprost (XALATAN) 0.005 % ophthalmic solution Place 1 drop into both eyes at bedtime.     metFORMIN (GLUCOPHAGE-XR) 500 MG 24 hr tablet TAKE 2 TABLETS BY MOUTH DAILY WITH BREAKFAST 180 tablet 3   metoprolol tartrate (LOPRESSOR) 25 MG tablet Take 1 tablet (25 mg total) by mouth 2 (two) times daily. 180 tablet 3   nitroGLYCERIN (NITROSTAT) 0.4 MG SL tablet Place 1 tablet (0.4 mg total) under the tongue every 5 (five) minutes as needed for chest pain. Up to three time and then call the doctor 30 tablet 5   simvastatin (ZOCOR) 20 MG tablet TAKE 1 TABLET(20 MG) BY MOUTH AT BEDTIME 90 tablet 1   sitaGLIPtin (JANUVIA) 50 MG tablet Take 1 tablet (50 mg total) by mouth daily. 90 tablet 3   sulfamethoxazole-trimethoprim (BACTRIM DS,SEPTRA DS) 800-160 MG tablet Take 1 tablet by  mouth every Monday, Wednesday, and Friday.  5   sulfaSALAzine (AZULFIDINE) 500 MG tablet 1 tablet Orally twice a day     tadalafil (CIALIS) 20 MG tablet Take 1 tablet (20 mg total) by mouth daily as needed for erectile dysfunction (no nitroglycerin within 72 hours). 10 tablet 5   valACYclovir (VALTREX) 500 MG tablet Take 1 tablet (500 mg total) by mouth as needed. (Patient taking differently: Take 500 mg by mouth 3 (three) times daily.) 60 tablet 3   clindamycin (CLEOCIN) 300 MG capsule Take 300 mg by mouth as needed.  (Patient not taking: Reported on 11/12/2022)     InFLIXimab (REMICADE IV) Inject into the vein every 8 (eight) weeks.  (Patient not taking: Reported  on 11/12/2022)     traMADol (ULTRAM) 50 MG tablet Take 1 tablet (50 mg total) by mouth every 12 (twelve) hours as needed for moderate pain. (Patient not taking: Reported on 11/12/2022) 60 tablet 5   No current facility-administered medications for this visit.     Objective:  BP 136/70   Pulse 88   Temp (!) 97.3 F (36.3 C)   Ht 5\' 11"  (1.803 m)   Wt 171 lb 3.2 oz (77.7 kg)   SpO2 98%   BMI 23.88 kg/m  Gen: NAD, resting comfortably CV: RRR no murmurs rubs or gallops Lungs: CTAB no crackles, wheeze, rhonchi Abdomen: soft/nontender/nondistended/normal bowel sounds.  Ext: no edema Skin: warm, dry     Assessment and Plan   #Health maintenance - plans on COVID shot. Plans to space 30 days from flu shot- today.   # Prior hallucinations-patient had initially reported that symptoms stopped after stopping metoprolol around April 2023 but later reported that he had been on the metoprolol the whole time-as a result we simply continue the metoprolol (had previously been on Wellbutrin which could have contributed as well and B12 had been low but have been corrected- and he thought the B12 itself could cause the issue)-unclear what was causing these previously   # Psoriatic arthritis-follows with rheumatology Dr. Dierdre Forth # Immunodeficiency S: Medication: Remicade every 8 weeks, tramadol helps with pain- once to twice a day -Also followed by [facility] infectious disease for immunodeficiency on Bactrim 3 days a week and as needed clindamycin as well as Diflucan- hasn't needed these A/P: psoriatic arthritis stable but imperfectly controlled- apparently they tried another medicine but too many side effects  Immunodeficiency- has been stable with follow up with [facility] and avoiding opportunistic infections  # CAD-follows with cardiology #hyperlipidemia S: Medication:Aspirin 81 mg,  simvastatin 20 mg, nitroglycerin on hand but has not had to use Symptoms:no chest pain or shortness of breath   Lab Results   Component Value Date   CHOL 85 07/17/2022   HDL 29.30 (L) 07/17/2022   LDLCALC 33 07/17/2022   TRIG 117.0 07/17/2022   CHOLHDL 3 07/17/2022   A/P: coronary artery disease asymptomatic continue current medications Lipids at goal continue current medications   # Diabetes- a1c goal 7.5 or even 8 S: Medication: glimepiride 4 mg, januvia 50 mg, metformin 1000mg  ER in AM CBGs- 99 this morning- has improved from last visit Exercise and diet- has tightened diet from last visit Lab Results  Component Value Date   HGBA1C 8.3 (H) 07/17/2022   HGBA1C 7.7 (H) 03/19/2022   HGBA1C 7.7 (H) 06/13/2021   A/P: hopefully improved as he has improved diet with opes to avoid more medicine- update a1c with labs and for now continue current medications    #  hypertension # Chronic kidney disease stage III with history of hyperkalemia S: medication:  metoprolol 25 mgb twice daily, Amlodipine 10 mg -GFR typically in 30s or 40s -Has not tolerated ACE inhibitor in the past with hyperkalemia issues  -usually 120s with remicade infustions A/P: hypertension stable- continue current medicines  Chronic kidney disease III hopefully stable- update cmp today.   # Depression- full remission with phq9 of 0 today    11/12/2022    8:45 AM 07/17/2022    9:06 AM 05/02/2022   12:35 PM  Depression screen PHQ 2/9  Decreased Interest 0 0 0  Down, Depressed, Hopeless 0 0 0  PHQ - 2 Score 0 0 0  Altered sleeping 0 0   Tired, decreased energy 0 2   Change in appetite 0 0   Feeling bad or failure about yourself  0 0   Trouble concentrating 0 0   Moving slowly or fidgety/restless 0 0   Suicidal thoughts 0 0   PHQ-9 Score 0 2   Difficult doing work/chores Not difficult at all Not difficult at all    # B12 deficiency- off medications and declines update  #glaucoma- now on drops in addition to having known retinopathy   #thrombocytosis- chronic- we previously called 04/04/21-Dr. Jossie Ng- less worried about  thrombocytosis unless regularly over 1k. can do e consult for oncology if over 1k again -wants him to have prevnar 20 he declined 06/13/21 and today- but doing flu shot - update CBC today  Recommended follow up: Return in about 4 months (around 03/14/2023) for followup or sooner if needed.Schedule b4 you leave. Future Appointments  Date Time Provider Department Center  01/17/2023  7:45 AM [facility] ANNUAL WELLNESS VISIT 1 [facility]  02/04/2023  9:40 AM Jonelle Sidle, MD [email or web address]   Lab/Order associations:   ICD-10-CM   1. Controlled type 2 diabetes mellitus with retinopathy, without long-term current use of insulin, macular edema presence unspecified, unspecified laterality, unspecified retinopathy severity (HCC)  E11.319 Hemoglobin A1c    Comprehensive metabolic panel    CBC with Differential/Platelet    2. Hyperlipidemia associated with type 2 diabetes mellitus (HCC)  E11.69    E78.5     3. Essential hypertension  I10       No orders of the defined types were placed in this encounter.   Return precautions advised.  Tana Conch, MD

## 2022-11-26 DIAGNOSIS — H2513 Age-related nuclear cataract, bilateral: Secondary | ICD-10-CM | POA: Diagnosis not present

## 2022-11-26 DIAGNOSIS — E119 Type 2 diabetes mellitus without complications: Secondary | ICD-10-CM | POA: Diagnosis not present

## 2022-11-26 DIAGNOSIS — H401131 Primary open-angle glaucoma, bilateral, mild stage: Secondary | ICD-10-CM | POA: Diagnosis not present

## 2022-11-26 LAB — HM DIABETES EYE EXAM

## 2022-11-27 DIAGNOSIS — R5383 Other fatigue: Secondary | ICD-10-CM | POA: Diagnosis not present

## 2022-11-27 DIAGNOSIS — Z79899 Other long term (current) drug therapy: Secondary | ICD-10-CM | POA: Diagnosis not present

## 2022-11-27 DIAGNOSIS — Z111 Encounter for screening for respiratory tuberculosis: Secondary | ICD-10-CM | POA: Diagnosis not present

## 2022-11-27 DIAGNOSIS — L4050 Arthropathic psoriasis, unspecified: Secondary | ICD-10-CM | POA: Diagnosis not present

## 2023-01-22 DIAGNOSIS — L4050 Arthropathic psoriasis, unspecified: Secondary | ICD-10-CM | POA: Diagnosis not present

## 2023-01-25 ENCOUNTER — Other Ambulatory Visit: Payer: Self-pay | Admitting: Family Medicine

## 2023-01-30 ENCOUNTER — Ambulatory Visit: Payer: Medicare PPO

## 2023-01-30 VITALS — Wt 177.0 lb

## 2023-01-30 DIAGNOSIS — Z0000 Encounter for general adult medical examination without abnormal findings: Secondary | ICD-10-CM

## 2023-01-30 NOTE — Progress Notes (Signed)
Subjective:   Chase Anderson. is a 77 y.o. male who presents for Medicare Annual/Subsequent preventive examination.  Visit Complete: Virtual I connected with  Nilda Calamity. on 01/30/23 by a audio enabled telemedicine application and verified that I am speaking with the correct person using two identifiers.  Patient Location: Home  Provider Location: Office/Clinic  I discussed the limitations of evaluation and management by telemedicine. The patient expressed understanding and agreed to proceed.  Vital Signs: Because this visit was a virtual/telehealth visit, some criteria may be missing or patient reported. Any vitals not documented were not able to be obtained and vitals that have been documented are patient reported.   Cardiac Risk Factors include: advanced age (>10men, >77 women);diabetes mellitus;dyslipidemia;hypertension;male gender     Objective:    Today's Vitals   01/30/23 0804  Weight: 177 lb (80.3 kg)   Body mass index is 24.69 kg/m.     01/30/2023    8:10 AM 01/11/2022    7:51 AM 12/29/2020    8:10 AM 12/24/2019    8:10 AM 11/23/2018   10:51 AM 11/20/2017   10:48 AM 10/30/2016    9:38 AM  Advanced Directives  Does Patient Have a Medical Advance Directive? No No No No No No No  Would patient like information on creating a medical advance directive? No - Patient declined No - Patient declined No - Patient declined No - Patient declined Yes (MAU/Ambulatory/Procedural Areas - Information given)      Current Medications (verified) Outpatient Encounter Medications as of 01/30/2023  Medication Sig   amLODipine (NORVASC) 10 MG tablet Take 1 tablet (10 mg total) by mouth daily.   aspirin EC 81 MG tablet Take 81 mg by mouth daily.   famotidine (PEPCID) 20 MG tablet TAKE 1 TABLET(20 MG) BY MOUTH TWICE DAILY AS NEEDED FOR HEARTBURN OR INDIGESTION   fluocinonide ointment (LIDEX) 0.05 % APPLY TOPICALLY ONCE DAILY AS NEEDED   fluticasone (FLONASE) 50 MCG/ACT nasal  spray SHAKE LIQUID AND USE 2 SPRAYS IN EACH NOSTRIL DAILY   glimepiride (AMARYL) 4 MG tablet Take 1 tablet (4 mg total) by mouth daily with breakfast.   glucose blood (ACCU-CHEK AVIVA PLUS) test strip USE TO TEST BLOOD SUGAR EVERY DAY   glucose blood (ACCU-CHEK AVIVA PLUS) test strip Use to test blood sugars 2 times daily. Dx: E11.9   InFLIXimab (REMICADE IV) Inject into the vein every 8 (eight) weeks.   latanoprost (XALATAN) 0.005 % ophthalmic solution Place 1 drop into both eyes at bedtime.   metFORMIN (GLUCOPHAGE-XR) 500 MG 24 hr tablet TAKE 2 TABLETS BY MOUTH DAILY WITH BREAKFAST   metoprolol tartrate (LOPRESSOR) 25 MG tablet Take 1 tablet (25 mg total) by mouth 2 (two) times daily.   nitroGLYCERIN (NITROSTAT) 0.4 MG SL tablet Place 1 tablet (0.4 mg total) under the tongue every 5 (five) minutes as needed for chest pain. Up to three time and then call the doctor   simvastatin (ZOCOR) 20 MG tablet TAKE 1 TABLET(20 MG) BY MOUTH AT BEDTIME   sitaGLIPtin (JANUVIA) 50 MG tablet Take 1 tablet (50 mg total) by mouth daily.   sulfamethoxazole-trimethoprim (BACTRIM DS,SEPTRA DS) 800-160 MG tablet Take 1 tablet by mouth every Monday, Wednesday, and Friday.   tadalafil (CIALIS) 20 MG tablet Take 1 tablet (20 mg total) by mouth daily as needed for erectile dysfunction (no nitroglycerin within 72 hours).   traMADol (ULTRAM) 50 MG tablet Take 1 tablet (50 mg total) by mouth every 12 (twelve)  hours as needed for moderate pain.   valACYclovir (VALTREX) 500 MG tablet Take 1 tablet (500 mg total) by mouth as needed. (Patient taking differently: Take 500 mg by mouth 3 (three) times daily.)   sulfaSALAzine (AZULFIDINE) 500 MG tablet 1 tablet Orally twice a day (Patient not taking: Reported on 01/30/2023)   [DISCONTINUED] clindamycin (CLEOCIN) 300 MG capsule Take 300 mg by mouth as needed.  (Patient not taking: Reported on 11/12/2022)   No facility-administered encounter medications on file as of 01/30/2023.     Allergies (verified) Amoxicillin, Codeine, Desvenlafaxine, Lisinopril, Methotrexate, Penicillin g, Penicillins, Pristiq [desvenlafaxine succinate er], and Sulfasalazine   History: Past Medical History:  Diagnosis Date   Abnormality of immune system    Allergic rhinitis    Allergy    Anxiety    Atopic dermatitis    CAD (coronary artery disease)    Mild at cardiac catheterization 2004   CKD (chronic kidney disease) stage 3, GFR 30-59 ml/min (HCC)    Collagen vascular disease (HCC)    Erectile dysfunction    Essential hypertension    GERD (gastroesophageal reflux disease)    Hyperlipidemia    Myocardial infarction (HCC) 2004   Suspected plaque rupture/aborted inferior infarct 2004, nonobstructive CAD managed medically   Osteoarthritis    Psoriatic arthritis (HCC)    Shingles    Type 2 diabetes mellitus (HCC)    Past Surgical History:  Procedure Laterality Date   COLONOSCOPY     ESOPHAGOGASTRODUODENOSCOPY  04/23/2005   KNEE ARTHROSCOPY     right   POLYPECTOMY  2008   TA x 4    TONSILLECTOMY     TOOTH EXTRACTION     x 4 removed    UPPER GASTROINTESTINAL ENDOSCOPY     Family History  Problem Relation Age of Onset   Dementia Mother    Other Mother        abdominal issues led to death- apparently inoperable growth-  likely cancer   COPD Father        led to death   Colon polyps Father    Diabetes Brother    Colon cancer Neg Hx    Esophageal cancer Neg Hx    Rectal cancer Neg Hx    Stomach cancer Neg Hx    Social History   Socioeconomic History   Marital status: Married    Spouse name: Not on file   Number of children: Not on file   Years of education: Not on file   Highest education level: Not on file  Occupational History   Not on file  Tobacco Use   Smoking status: Former    Current packs/day: 0.00    Average packs/day: 2.0 packs/day for 30.0 years (60.0 ttl pk-yrs)    Types: Cigarettes    Start date: 12/10/1971    Quit date: 12/09/2001    Years  since quitting: 21.1   Smokeless tobacco: Former    Types: Chew    Quit date: 03/11/2002  Vaping Use   Vaping status: Never Used  Substance and Sexual Activity   Alcohol use: No    Alcohol/week: 0.0 standard drinks of alcohol   Drug use: No   Sexual activity: Not on file  Other Topics Concern   Not on file  Social History Narrative   Married. 2 children. 2 stepchildren. 7 grandkids including step   Take care of daughter of law Levi Mulberry- son left her with 9 year old.       Retired after  35 years [employer]      Hobbies: golfer, gardening   Social Determinants of Health   Financial Resource Strain: Low Risk  (01/30/2023)   Overall Financial Resource Strain (CARDIA)    Difficulty of Paying Living Expenses: Not hard at all  Food Insecurity: No Food Insecurity (01/30/2023)   Hunger Vital Sign    Worried About Running Out of Food in the Last Year: Never true    Ran Out of Food in the Last Year: Never true  Transportation Needs: No Transportation Needs (01/30/2023)   PRAPARE - Administrator, Civil Service (Medical): No    Lack of Transportation (Non-Medical): No  Physical Activity: Inactive (01/30/2023)   Exercise Vital Sign    Days of Exercise per Week: 0 days    Minutes of Exercise per Session: 0 min  Stress: No Stress Concern Present (01/30/2023)   [employer] of Occupational Health - Occupational Stress Questionnaire    Feeling of Stress : Not at all  Social Connections: Moderately Integrated (01/30/2023)   Social Connection and Isolation Panel [NHANES]    Frequency of Communication with Friends and Family: More than three times a week    Frequency of Social Gatherings with Friends and Family: More than three times a week    Attends Religious Services: More than 4 times per year    Active Member of [employer] or Organizations: No    Attends Engineer, structural: Never    Marital Status: Married    Tobacco Counseling Counseling given: Not  Answered   Clinical Intake:  Pre-visit preparation completed: Yes  Pain : No/denies pain     BMI - recorded: 24.69 Nutritional Status: BMI of 19-24  Normal Nutritional Risks: None Diabetes: Yes CBG done?: Yes (155 per pt) CBG resulted in Enter/ Edit results?: No Did pt. bring in CBG monitor from home?: No  How often do you need to have someone help you when you read instructions, pamphlets, or other written materials from your doctor or pharmacy?: 1 - Never  Interpreter Needed?: No  Information entered by :: Lanier Ensign, LPN   Activities of Daily Living    01/30/2023    8:05 AM  In your present state of health, do you have any difficulty performing the following activities:  Hearing? 1  Vision? 0  Difficulty concentrating or making decisions? 0  Walking or climbing stairs? 0  Dressing or bathing? 0  Doing errands, shopping? 0  Preparing Food and eating ? N  Using the Toilet? N  In the past six months, have you accidently leaked urine? N  Do you have problems with loss of bowel control? N  Managing your Medications? N  Managing your Finances? N  Housekeeping or managing your Housekeeping? N    Patient Care Team: Shelva Majestic, MD as PCP - General (Family Medicine) Jonelle Sidle, MD as PCP - Cardiology (Cardiology) Lurena Nida, MD as Consulting Physician (Rheumatology) Billy Coast, MD as Consulting Physician (Infectious Diseases) Donnetta Hail, MD as Consulting Physician (Rheumatology) Daisy Lazar, DO as Consulting Physician (Optometry)  Indicate any recent Medical Services you may have received from other than [facility] providers in the past year (date may be approximate).     Assessment:   This is a routine wellness examination for [place].  Hearing/Vision screen Hearing Screening - Comments:: Pt HOH  Vision Screening - Comments:: Pt follows up with Dr Charise Killian for annual eye exams    Goals Addressed  This Visit's  Progress    Patient Stated       Maintain health and activity        Depression Screen    01/30/2023    8:10 AM 11/12/2022    8:45 AM 07/17/2022    9:06 AM 05/02/2022   12:35 PM 03/19/2022    9:39 AM 01/11/2022    7:50 AM 06/13/2021    8:05 AM  PHQ 2/9 Scores  PHQ - 2 Score 0 0 0 0 0 0 0  PHQ- 9 Score  0 2  0  0    Fall Risk    01/30/2023    8:11 AM 11/12/2022    8:45 AM 07/17/2022    9:06 AM 01/11/2022    7:52 AM 01/08/2022    7:50 PM  Fall Risk   Falls in the past year? 0 0 0 0 0  Number falls in past yr: 0 0 0 0   Injury with Fall? 0 0 0 0   Risk for fall due to : No Fall Risks No Fall Risks No Fall Risks Impaired vision   Follow up Falls prevention discussed Falls evaluation completed Falls evaluation completed Falls prevention discussed     MEDICARE RISK AT HOME: Medicare Risk at Home Any stairs in or around the home?: Yes If so, are there any without handrails?: No Home free of loose throw rugs in walkways, pet beds, electrical cords, etc?: Yes Adequate lighting in your home to reduce risk of falls?: Yes Life alert?: No Use of a cane, walker or w/c?: No Grab bars in the bathroom?: No Shower chair or bench in shower?: No Elevated toilet seat or a handicapped toilet?: No  TIMED UP AND GO:  Was the test performed?  No    Cognitive Function:    11/20/2017   10:52 AM 10/30/2016    9:50 AM  MMSE - Mini Mental State Exam  Not completed: -- --        01/30/2023    8:12 AM 01/11/2022    7:52 AM 12/29/2020    8:13 AM 12/24/2019    8:15 AM 11/23/2018   10:52 AM  6CIT Screen  What Year? 0 points 0 points 0 points 0 points 0 points  What month? 0 points 0 points 0 points 0 points 0 points  What time? 0 points 0 points 0 points  0 points  Count back from 20 0 points 0 points 0 points 0 points 0 points  Months in reverse 0 points 0 points 0 points 0 points 0 points  Repeat phrase 0 points 0 points 2 points 0 points 0 points  Total Score 0 points 0 points 2 points  0  points    Immunizations Immunization History  Administered Date(s) Administered   Fluad Quad(high Dose 65+) 11/23/2018, 12/07/2020   Influenza Split 01/23/2012, 11/19/2012   Influenza Whole 11/26/2007, 12/14/2009   Influenza, High Dose Seasonal PF 02/01/2014, 02/06/2015, 02/07/2016, 12/06/2016   Influenza, Seasonal, Injecte, Preservative Fre 01/05/2009   Influenza,inj,Quad PF,6+ Mos 11/19/2012, 12/30/2019, 12/05/2021   Influenza-Unspecified 01/05/2009, 02/01/2014, 02/06/2015, 02/07/2016, 12/06/2016, 11/27/2017, 11/23/2018   Moderna Sars-Covid-2 Vaccination 04/07/2019, 05/05/2019, 01/15/2020, 07/15/2020   Pneumococcal Conjugate-13 06/02/2013   Pneumococcal Polysaccharide-23 03/11/2004, 06/29/2009   Td 03/11/2002   Tetanus 03/10/2013   Zoster Recombinant(Shingrix) 06/10/2017, 10/01/2017    TDAP status: Up to date  Flu Vaccine status: Up to date per pt   Pneumococcal vaccine status: Due, Education has been provided regarding the importance of this vaccine.  Advised may receive this vaccine at local pharmacy or Health Dept. Aware to provide a copy of the vaccination record if obtained from local pharmacy or Health Dept. Verbalized acceptance and understanding.  Covid-19 vaccine status: Information provided on how to obtain vaccines.   Qualifies for Shingles Vaccine? Yes   Zostavax completed Yes   Shingrix Completed?: Yes  Screening Tests Health Maintenance  Topic Date Due   COVID-19 Vaccine (5 - 2023-24 season) 11/10/2022   INFLUENZA VACCINE  06/09/2023 (Originally 10/10/2022)   Pneumonia Vaccine 22+ Years old (3 of 3 - PPSV23 or PCV20) 07/17/2023 (Originally 06/03/2018)   DTaP/Tdap/Td (3 - Tdap) 03/11/2023   Diabetic kidney evaluation - Urine ACR  03/20/2023   HEMOGLOBIN A1C  05/12/2023   FOOT EXAM  07/17/2023   Diabetic kidney evaluation - eGFR measurement  11/12/2023   OPHTHALMOLOGY EXAM  11/26/2023   Medicare Annual Wellness (AWV)  01/30/2024   Colonoscopy  08/23/2024    Hepatitis C Screening  Completed   Zoster Vaccines- Shingrix  Completed   HPV VACCINES  Aged Out    Health Maintenance  Health Maintenance Due  Topic Date Due   COVID-19 Vaccine (5 - 2023-24 season) 11/10/2022    Colorectal cancer screening: Type of screening: Colonoscopy. Completed 08/23/21. Repeat every 3 years   Additional Screening:  Hepatitis C Screening:  Completed 02/07/16  Vision Screening: Recommended annual ophthalmology exams for early detection of glaucoma and other disorders of the eye. Is the patient up to date with their annual eye exam?  Yes  Who is the provider or what is the name of the office in which the patient attends annual eye exams? Dr Charise Killian  If pt is not established with a provider, would they like to be referred to a provider to establish care? No .   Dental Screening: Recommended annual dental exams for proper oral hygiene  Diabetic Foot Exam: Diabetic Foot Exam: Completed 07/17/22  Community Resource Referral / Chronic Care Management: CRR required this visit?  No   CCM required this visit?  No     Plan:     I have personally reviewed and noted the following in the patient's chart:   Medical and social history Use of alcohol, tobacco or illicit drugs  Current medications and supplements including opioid prescriptions. Patient is currently taking opioid prescriptions. Information provided to patient regarding non-opioid alternatives. Patient advised to discuss non-opioid treatment plan with their provider. Functional ability and status Nutritional status Physical activity Advanced directives List of other physicians Hospitalizations, surgeries, and ER visits in previous 12 months Vitals Screenings to include cognitive, depression, and falls Referrals and appointments  In addition, I have reviewed and discussed with patient certain preventive protocols, quality metrics, and best practice recommendations. A written personalized care plan  for preventive services as well as general preventive health recommendations were provided to patient.     Marzella Schlein, LPN   [id number]   After Visit Summary: (MyChart) Due to this being a telephonic visit, the after visit summary with patients personalized plan was offered to patient via MyChart   Nurse Notes: none

## 2023-01-30 NOTE — Patient Instructions (Signed)
[name] , Thank you for taking time to come for your Medicare Wellness Visit. I appreciate your ongoing commitment to your health goals. Please review the following plan we discussed and let me know if I can assist you in the future.   Referrals/Orders/Follow-Ups/Clinician Recommendations: maitain health and activity  Aim for 30 minutes of exercise or brisk walking, 6-8 glasses of water, and 5 servings of fruits and vegetables each day.   This is a list of the screening recommended for you and due dates:  Health Maintenance  Topic Date Due   COVID-19 Vaccine (5 - 2023-24 season) 11/10/2022   Flu Shot  06/09/2023*   Pneumonia Vaccine (3 of 3 - PPSV23 or PCV20) 07/17/2023*   DTaP/Tdap/Td vaccine (3 - Tdap) 03/11/2023   Yearly kidney health urinalysis for diabetes  03/20/2023   Hemoglobin A1C  05/12/2023   Complete foot exam   07/17/2023   Yearly kidney function blood test for diabetes  11/12/2023   Eye exam for diabetics  11/26/2023   Medicare Annual Wellness Visit  01/30/2024   Colon Cancer Screening  08/23/2024   Hepatitis C Screening  Completed   Zoster (Shingles) Vaccine  Completed   HPV Vaccine  Aged Out  *Topic was postponed. The date shown is not the original due date.    Advanced directives: (Declined) Advance directive discussed with you today. Even though you declined this today, please call our office should you change your mind, and we can give you the proper paperwork for you to fill out.  Next Medicare Annual Wellness Visit scheduled for next year: Yes

## 2023-02-04 ENCOUNTER — Ambulatory Visit: Payer: Medicare PPO | Attending: Cardiology | Admitting: Cardiology

## 2023-02-04 ENCOUNTER — Encounter: Payer: Self-pay | Admitting: Cardiology

## 2023-02-04 VITALS — BP 118/62 | HR 85 | Ht 71.0 in | Wt 175.0 lb

## 2023-02-04 DIAGNOSIS — E782 Mixed hyperlipidemia: Secondary | ICD-10-CM

## 2023-02-04 DIAGNOSIS — I340 Nonrheumatic mitral (valve) insufficiency: Secondary | ICD-10-CM | POA: Diagnosis not present

## 2023-02-04 DIAGNOSIS — I25119 Atherosclerotic heart disease of native coronary artery with unspecified angina pectoris: Secondary | ICD-10-CM | POA: Diagnosis not present

## 2023-02-04 DIAGNOSIS — I10 Essential (primary) hypertension: Secondary | ICD-10-CM

## 2023-02-04 NOTE — Progress Notes (Signed)
Cardiology Office Note  Date: 02/04/2023   ID: Nilda Calamity., DOB 1946-01-05, MRN [id number]  History of Present Illness: Chase Anderson. is a 77 y.o. male last seen in May.  He is here for a follow-up visit.  Reports NYHA class II dyspnea, no exertional chest pain, no palpitations or syncope.  Remains functional with ADLs including yard work.  Denies any change in stamina.  I reviewed his medications.  Cardiac regimen includes aspirin, Norvasc, Lopressor, Zocor, and as needed nitroglycerin.  I reviewed his echocardiogram from July as noted below.  Plan to continue annual surveillance at this point.  I also went over his most recent lab work.  Physical Exam: VS:  BP 118/62 (BP Location: Right Arm)   Pulse 85   Ht 5\' 11"  (1.803 m)   Wt 175 lb (79.4 kg)   SpO2 98%   BMI 24.41 kg/m , BMI Body mass index is 24.41 kg/m.  Wt Readings from Last 3 Encounters:  02/04/23 175 lb (79.4 kg)  01/30/23 177 lb (80.3 kg)  11/12/22 171 lb 3.2 oz (77.7 kg)    General: Patient appears comfortable at rest. HEENT: Conjunctiva and lids normal. Neck: Supple, no elevated JVP or carotid bruits. Lungs: Clear to auscultation, nonlabored breathing at rest. Cardiac: Regular rate and rhythm, no S3, 3/6 mid-to-late systolic murmur best heard at the apex and laterally, no pericardial rub. Extremities: No pitting edema.  ECG:  An ECG dated 07/26/2022 was personally reviewed today and demonstrated:  Sinus rhythm with prolonged PR interval and low voltage, leftward axis.  Labwork: 11/12/2022: ALT 18; AST 24; BUN 25; Creatinine, Ser 1.93; Hemoglobin 15.3; Platelets 1047.0 Critical result called to [facility] on 11/12/2022 2:48 PM by Scales, Hope. Results were read back to caller.; Potassium 5.1; Sodium 134     Component Value Date/Time   CHOL 85 07/17/2022 0908   TRIG 117.0 07/17/2022 0908   HDL 29.30 (L) 07/17/2022 0908   CHOLHDL 3 07/17/2022 0908   VLDL 23.4 07/17/2022 0908   LDLCALC 33  07/17/2022 0908   LDLCALC 41 02/25/2020 0957   Other Studies Reviewed Today:  Echocardiogram 09/11/2022:  1. Left ventricular ejection fraction, by estimation, is 60 to 65%. The  left ventricle has normal function. The left ventricle has no regional  wall motion abnormalities. Left ventricular diastolic parameters are  indeterminate.   2. Right ventricular systolic function is normal. The right ventricular  size is normal. There is normal pulmonary artery systolic pressure.   3. Left atrial size was severely dilated.   4. MV anulus is dilated with poor leaflet coaptation. MR vena contracta  is 0.4 cm. . The mitral valve is abnormal. Moderate mitral valve  regurgitation. No evidence of mitral stenosis.   5. The tricuspid valve is abnormal.   6. The aortic valve is tricuspid. There is mild calcification of the  aortic valve. There is mild thickening of the aortic valve. Aortic valve  regurgitation is mild. No aortic stenosis is present.   7. The inferior vena cava is dilated in size with >50% respiratory  variability, suggesting right atrial pressure of 8 mmHg.   Assessment and Plan:  1.  CAD, mild nonobstructive disease documented at cardiac catheterization in 2004 in the setting of suspected plaque rupture event versus aborted inferior infarct.  Follow-up exercise Myoview in 2017 suggested inferior infarct scar but no active ischemia.  Echocardiogram in July of this year revealed LVEF 60 to 65% without regional wall motion abnormalities.  He does not report any angina at this time.  Continue aspirin, Zocor, and as needed nitroglycerin.   2.  Bileaflet mitral valve prolapse associated with moderate mitral regurgitation by follow-up echocardiogram in July of this year.  RVSP normal.  Symptomatically stable.  We will plan on an echocardiogram in July of next year.   3.  Primary hypertension.  No change in current antihypertensive regimen including Norvasc and Lopressor.   4.  CKD stage  IIIb.  Creatinine 1.93 in September of this year, potassium 5.1. Continues to follow with PCP.   5.  Mixed hyperlipidemia.  Continue Zocor, recent LDL 33.  Disposition:  Follow up  6 months.  Signed, Jonelle Sidle, M.D., F.A.C.C. [facility]

## 2023-02-04 NOTE — Patient Instructions (Signed)

## 2023-03-11 DIAGNOSIS — E113293 Type 2 diabetes mellitus with mild nonproliferative diabetic retinopathy without macular edema, bilateral: Secondary | ICD-10-CM | POA: Diagnosis not present

## 2023-03-14 ENCOUNTER — Encounter: Payer: Self-pay | Admitting: Family Medicine

## 2023-03-14 ENCOUNTER — Ambulatory Visit: Payer: Medicare PPO | Admitting: Family Medicine

## 2023-03-14 VITALS — BP 109/69 | HR 64 | Temp 97.1°F | Ht 71.0 in | Wt 177.8 lb

## 2023-03-14 DIAGNOSIS — E11319 Type 2 diabetes mellitus with unspecified diabetic retinopathy without macular edema: Secondary | ICD-10-CM | POA: Diagnosis not present

## 2023-03-14 DIAGNOSIS — Z7984 Long term (current) use of oral hypoglycemic drugs: Secondary | ICD-10-CM | POA: Diagnosis not present

## 2023-03-14 DIAGNOSIS — L4050 Arthropathic psoriasis, unspecified: Secondary | ICD-10-CM

## 2023-03-14 DIAGNOSIS — N183 Chronic kidney disease, stage 3 unspecified: Secondary | ICD-10-CM | POA: Diagnosis not present

## 2023-03-14 LAB — CBC WITH DIFFERENTIAL/PLATELET
Basophils Absolute: 0.1 10*3/uL (ref 0.0–0.1)
Basophils Relative: 1.1 % (ref 0.0–3.0)
Eosinophils Absolute: 0.4 10*3/uL (ref 0.0–0.7)
Eosinophils Relative: 5.3 % — ABNORMAL HIGH (ref 0.0–5.0)
HCT: 46.6 % (ref 39.0–52.0)
Hemoglobin: 15.1 g/dL (ref 13.0–17.0)
Lymphocytes Relative: 10.2 % — ABNORMAL LOW (ref 12.0–46.0)
Lymphs Abs: 0.8 10*3/uL (ref 0.7–4.0)
MCHC: 32.4 g/dL (ref 30.0–36.0)
MCV: 86.3 fL (ref 78.0–100.0)
Monocytes Absolute: 0.6 10*3/uL (ref 0.1–1.0)
Monocytes Relative: 8.3 % (ref 3.0–12.0)
Neutro Abs: 5.8 10*3/uL (ref 1.4–7.7)
Neutrophils Relative %: 75.1 % (ref 43.0–77.0)
Platelets: 899 10*3/uL — ABNORMAL HIGH (ref 150.0–400.0)
RBC: 5.4 Mil/uL (ref 4.22–5.81)
RDW: 15.8 % — ABNORMAL HIGH (ref 11.5–15.5)
WBC: 7.8 10*3/uL (ref 4.0–10.5)

## 2023-03-14 LAB — COMPREHENSIVE METABOLIC PANEL
ALT: 18 U/L (ref 0–53)
AST: 26 U/L (ref 0–37)
Albumin: 4.6 g/dL (ref 3.5–5.2)
Alkaline Phosphatase: 41 U/L (ref 39–117)
BUN: 21 mg/dL (ref 6–23)
CO2: 23 meq/L (ref 19–32)
Calcium: 9.5 mg/dL (ref 8.4–10.5)
Chloride: 104 meq/L (ref 96–112)
Creatinine, Ser: 1.67 mg/dL — ABNORMAL HIGH (ref 0.40–1.50)
GFR: 39.08 mL/min — ABNORMAL LOW (ref >60.00)
Glucose, Bld: 143 mg/dL — ABNORMAL HIGH (ref 70–99)
Potassium: 4.7 meq/L (ref 3.5–5.1)
Sodium: 135 meq/L (ref 135–145)
Total Bilirubin: 0.6 mg/dL (ref 0.2–1.2)
Total Protein: 6.8 g/dL (ref 6.0–8.3)

## 2023-03-14 LAB — POCT GLYCOSYLATED HEMOGLOBIN (HGB A1C): Hemoglobin A1C: 7 % — AB (ref 4.0–5.6)

## 2023-03-14 LAB — MICROALBUMIN / CREATININE URINE RATIO
Creatinine,U: 128.6 mg/dL
Microalb Creat Ratio: 2.5 mg/g (ref 0.0–30.0)
Microalb, Ur: 3.2 mg/dL — ABNORMAL HIGH (ref 0.0–1.9)

## 2023-03-14 NOTE — Patient Instructions (Addendum)
 Tetanus, Diphtheria, and Pertussis (Tdap) at pharmacy  -he is going to try double bandaid splint on trigger fingers for 1-2 weeks- if not improving or worsens I want to refer him to sports medicine   Also wear your splint for carpal tunnel and if that's not better we can refer to sports medicine as well to consider injections/further workup  A1c at 7.0 on Point of Care (POC) test- the blood test is usually a little higher so lets try to tighten up on the diet  Please stop by lab before you go If you have mychart- we will send your results within 3 business days of us  receiving them.  If you do not have mychart- we will call you about results within 5 business days of us  receiving them.  *please also note that you will see labs on mychart as soon as they post. I will later go in and write notes on them- will say notes from Dr. Katrinka   Recommended follow up: Return in about 4 months (around 07/12/2023) for physical or sooner if needed.Schedule b4 you leave. Physical has to be after may 8th though

## 2023-03-14 NOTE — Progress Notes (Signed)
 Phone [phone] In person visit   Subjective:   Chase Anderson. is a 78 y.o. year old very pleasant male patient who presents for/with See problem oriented charting Chief Complaint  Patient presents with   Diabetes   Past Medical History-  Patient Active Problem List   Diagnosis Date Noted   Thrombocytosis 04/03/2016    Priority: High   Depression 03/27/2015    Priority: High   Psoriatic arthritis (HCC) 01/25/2013    Priority: High   Mitral valve prolapse 12/25/2010    Priority: High   Leukocytopenia 11/24/2006    Priority: High   Controlled type 2 diabetes mellitus with ophthalmic complication (HCC) 10/06/2006    Priority: High   CAD (coronary artery disease) with history MI 2004- no intervention 10/06/2006    Priority: High   Mitral valve regurgitation 02/15/2019    Priority: Medium    Dyshidrotic eczema 02/07/2016    Priority: Medium    CKD (chronic kidney disease), stage III (HCC) 09/15/2015    Priority: Medium    History of BPH 12/14/2009    Priority: Medium    Hyperlipidemia associated with type 2 diabetes mellitus (HCC) 10/06/2006    Priority: Medium    Essential hypertension 10/06/2006    Priority: Medium    GERD (gastroesophageal reflux disease) 08/24/2019    Priority: Low   Diabetic retinopathy (HCC) 02/20/2017    Priority: Low   ERECTILE DYSFUNCTION 11/26/2007    Priority: Low   Candidiasis 11/25/2012   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) 11/19/2012    Medications- reviewed and updated Current Outpatient Medications  Medication Sig Dispense Refill   amLODipine  (NORVASC ) 10 MG tablet Take 1 tablet (10 mg total) by mouth daily. 100 tablet 3   aspirin EC 81 MG tablet Take 81 mg by mouth daily.     famotidine  (PEPCID ) 20 MG tablet TAKE 1 TABLET(20 MG) BY MOUTH TWICE DAILY AS NEEDED FOR HEARTBURN OR INDIGESTION 60 tablet 11   fluticasone  (FLONASE ) 50 MCG/ACT nasal spray SHAKE LIQUID AND USE 2 SPRAYS IN EACH NOSTRIL DAILY  16 g 3   glimepiride  (AMARYL ) 4 MG tablet Take 1 tablet (4 mg total) by mouth daily with breakfast. 90 tablet 3   glucose blood (ACCU-CHEK AVIVA PLUS) test strip USE TO TEST BLOOD SUGAR EVERY DAY 100 strip 3   glucose blood (ACCU-CHEK AVIVA PLUS) test strip Use to test blood sugars 2 times daily. Dx: E11.9 100 each 3   InFLIXimab  (REMICADE  IV) Inject into the vein every 8 (eight) weeks.     latanoprost (XALATAN) 0.005 % ophthalmic solution Place 1 drop into both eyes at bedtime.     metFORMIN  (GLUCOPHAGE -XR) 500 MG 24 hr tablet TAKE 2 TABLETS BY MOUTH DAILY WITH BREAKFAST 180 tablet 3   metoprolol  tartrate (LOPRESSOR ) 25 MG tablet Take 1 tablet (25 mg total) by mouth 2 (two) times daily. 180 tablet 3   nitroGLYCERIN  (NITROSTAT ) 0.4 MG SL tablet Place 1 tablet (0.4 mg total) under the tongue every 5 (five) minutes as needed for chest pain. Up to three time and then call the doctor 30 tablet 5   simvastatin  (ZOCOR ) 20 MG tablet TAKE 1 TABLET(20 MG) BY MOUTH AT BEDTIME 90 tablet 1   sitaGLIPtin  (JANUVIA ) 50 MG tablet Take 1 tablet (50 mg total) by mouth daily. 90 tablet 3   sulfamethoxazole-trimethoprim (BACTRIM DS,SEPTRA DS) 800-160 MG tablet Take 1 tablet by mouth every Monday, Wednesday, and Friday.  5   tadalafil  (CIALIS ) 20 MG  tablet Take 1 tablet (20 mg total) by mouth daily as needed for erectile dysfunction (no nitroglycerin  within 72 hours). 10 tablet 5   traMADol  (ULTRAM ) 50 MG tablet Take 1 tablet (50 mg total) by mouth every 12 (twelve) hours as needed for moderate pain. 60 tablet 5   valACYclovir  (VALTREX ) 500 MG tablet Take 1 tablet (500 mg total) by mouth as needed. (Patient taking differently: Take 500 mg by mouth 3 (three) times daily.) 60 tablet 3   No current facility-administered medications for this visit.     Objective:  BP 109/69   Pulse 64   Temp (!) 97.1 F (36.2 C) (Temporal)   Ht 5' 11 (1.803 m)   Wt 177 lb 12.8 oz (80.6 kg)   SpO2 97%   BMI 24.80 kg/m  Gen: NAD,  resting comfortably CV: RRR murmur at apex Lungs: CTAB no crackles, wheeze, rhonchi Ext: no edema Skin: warm, dry     Assessment and Plan   #trigger finger- left 5th finger and right 4th finger- bothers him at night in daytime -also has numbness in fingers 1-3 on right- Also wear your splint for carpal tunnel and if that's not better we can refer to sports medicine as well to consider injections/further workup -he is going to try double bandaid splint on trigger fingers for 1-2 weeks- if not improving or worsens I want to refer him to sports medicine   # Psoriatic arthritis-follows with rheumatology Dr. Mai # Immunodeficiency S: Medication: Remicade  every 8 weeks , tramadol  helps with pain -Also followed by [facility] infectious disease for immunodeficiency on Bactrim 3 days a week and as needed clindamycin as well as Diflucan A/P: psoriatic arthritis has been controlled- continue current medications -can refill tramadol  through me if needed Immunodeficiency is stable- continue current medications    # CAD-follows with cardiology Dr. Debera #hyperlipidemia S: Medication:Aspirin 81 mg simvastatin  20 mg, nitroglycerin  on hand but has not had to use Symptoms: no chest pain or shortness of breath  Lab Results  Component Value Date   CHOL 85 07/17/2022   HDL 29.30 (L) 07/17/2022   LDLCALC 33 07/17/2022   TRIG 117.0 07/17/2022   CHOLHDL 3 07/17/2022    A/P: coronary artery disease asymptomatic continue current medications Cholesterol is at goal- continue current medications   #Mitral regurgitation/prolapse- cardiology follow up  - echocardiogram in June planed  # Diabetes- a1c goal 7.5 or even 8 S: Medication: glimepiride  4 mg, januvia  50 mg, metformin  1000mg  ER in AM Lab Results  Component Value Date   HGBA1C 7.0 (A) 03/14/2023   HGBA1C 6.9 (H) 11/12/2022   HGBA1C 8.3 (H) 07/17/2022  A/P: a1c at 7 Point of Care (POC)- continue current medications - discussed working on healthy  dietary changes   #hypertension # Chronic kidney disease stage III with history of hyperkalemia S: medication: Amlodipine  10 mg, metoprolol  25 mg twice daily  -GFR typically in 30s or 40s -Has not tolerated ACE inhibitor in the past with hyperkalemia issues  BP Readings from Last 3 Encounters:  03/14/23 109/69  02/04/23 118/62  11/12/22 136/70  A/P: blood pressure well controlled continue current medications  Chronic kidney disease III with GFR in 30s- monitor with labs- if gets under 30 consider [facility] referral  # B12 deficiency- replacement caused hallucinations so he opts to hold off and not have rechecked  #thrombocytosis- chronic- we previously called 04/04/21-Dr. Librada HOUSTON- less worried about thrombocytosis unless regularly over 1k. can do e consult for oncology if  over 1k again -wants to hold off on Prevnar 20    Recommended follow up: Return in about 4 months (around 07/12/2023) for physical or sooner if needed.Schedule b4 you leave. Future Appointments  Date Time Provider Department Center  08/14/2023  9:00 AM Debera Jayson MATSU, MD [facility]  02/10/2024  8:00 AM [facility] ANNUAL WELLNESS VISIT 1 [facility]    Lab/Order associations:   ICD-10-CM   1. Controlled type 2 diabetes mellitus with retinopathy, without long-term current use of insulin, macular edema presence unspecified, unspecified laterality, unspecified retinopathy severity (HCC)  E11.319 Urine Microalbumin w/creat. ratio    POCT HgB A1C    Comprehensive metabolic panel    CBC with Differential/Platelet    2. Psoriatic arthritis (HCC) Chronic L40.50     3. Stage 3 chronic kidney disease, unspecified whether stage 3a or 3b CKD (HCC) Chronic N18.30       No orders of the defined types were placed in this encounter.   Return precautions advised.  Garnette Lukes, MD

## 2023-03-18 ENCOUNTER — Other Ambulatory Visit: Payer: Self-pay | Admitting: Family Medicine

## 2023-03-18 DIAGNOSIS — E7800 Pure hypercholesterolemia, unspecified: Secondary | ICD-10-CM

## 2023-03-19 DIAGNOSIS — L4050 Arthropathic psoriasis, unspecified: Secondary | ICD-10-CM | POA: Diagnosis not present

## 2023-03-19 DIAGNOSIS — Z79899 Other long term (current) drug therapy: Secondary | ICD-10-CM | POA: Diagnosis not present

## 2023-04-08 ENCOUNTER — Other Ambulatory Visit: Payer: Self-pay | Admitting: Family Medicine

## 2023-04-08 MED ORDER — FAMOTIDINE 20 MG PO TABS: 20.0000 mg | ORAL_TABLET | Freq: Two times a day (BID) | ORAL | 11 refills | Status: DC

## 2023-04-08 NOTE — Progress Notes (Signed)
Refill needed on famotidine- sent in

## 2023-05-03 ENCOUNTER — Other Ambulatory Visit: Payer: Self-pay | Admitting: Family Medicine

## 2023-05-03 DIAGNOSIS — I10 Essential (primary) hypertension: Secondary | ICD-10-CM

## 2023-05-05 ENCOUNTER — Other Ambulatory Visit: Payer: Self-pay | Admitting: Family Medicine

## 2023-05-06 DIAGNOSIS — M79645 Pain in left finger(s): Secondary | ICD-10-CM | POA: Diagnosis not present

## 2023-05-06 DIAGNOSIS — R5382 Chronic fatigue, unspecified: Secondary | ICD-10-CM | POA: Diagnosis not present

## 2023-05-06 DIAGNOSIS — L4050 Arthropathic psoriasis, unspecified: Secondary | ICD-10-CM | POA: Diagnosis not present

## 2023-05-06 DIAGNOSIS — N183 Chronic kidney disease, stage 3 unspecified: Secondary | ICD-10-CM | POA: Diagnosis not present

## 2023-05-06 DIAGNOSIS — M1991 Primary osteoarthritis, unspecified site: Secondary | ICD-10-CM | POA: Diagnosis not present

## 2023-05-06 DIAGNOSIS — D75839 Thrombocytosis, unspecified: Secondary | ICD-10-CM | POA: Diagnosis not present

## 2023-05-06 DIAGNOSIS — Z6825 Body mass index (BMI) 25.0-25.9, adult: Secondary | ICD-10-CM | POA: Diagnosis not present

## 2023-05-06 DIAGNOSIS — E663 Overweight: Secondary | ICD-10-CM | POA: Diagnosis not present

## 2023-05-14 DIAGNOSIS — Z79899 Other long term (current) drug therapy: Secondary | ICD-10-CM | POA: Diagnosis not present

## 2023-05-14 DIAGNOSIS — L4050 Arthropathic psoriasis, unspecified: Secondary | ICD-10-CM | POA: Diagnosis not present

## 2023-05-14 DIAGNOSIS — R5383 Other fatigue: Secondary | ICD-10-CM | POA: Diagnosis not present

## 2023-05-19 DIAGNOSIS — N183 Chronic kidney disease, stage 3 unspecified: Secondary | ICD-10-CM | POA: Diagnosis not present

## 2023-05-19 LAB — LAB REPORT - SCANNED: EGFR: 46

## 2023-06-06 ENCOUNTER — Other Ambulatory Visit: Payer: Self-pay | Admitting: Family Medicine

## 2023-07-02 ENCOUNTER — Other Ambulatory Visit: Payer: Self-pay | Admitting: Family Medicine

## 2023-07-09 DIAGNOSIS — R5383 Other fatigue: Secondary | ICD-10-CM | POA: Diagnosis not present

## 2023-07-09 DIAGNOSIS — L4050 Arthropathic psoriasis, unspecified: Secondary | ICD-10-CM | POA: Diagnosis not present

## 2023-07-09 DIAGNOSIS — Z79899 Other long term (current) drug therapy: Secondary | ICD-10-CM | POA: Diagnosis not present

## 2023-08-05 DIAGNOSIS — M79645 Pain in left finger(s): Secondary | ICD-10-CM | POA: Diagnosis not present

## 2023-08-05 DIAGNOSIS — L4050 Arthropathic psoriasis, unspecified: Secondary | ICD-10-CM | POA: Diagnosis not present

## 2023-08-05 DIAGNOSIS — N183 Chronic kidney disease, stage 3 unspecified: Secondary | ICD-10-CM | POA: Diagnosis not present

## 2023-08-05 DIAGNOSIS — Z6825 Body mass index (BMI) 25.0-25.9, adult: Secondary | ICD-10-CM | POA: Diagnosis not present

## 2023-08-05 DIAGNOSIS — E663 Overweight: Secondary | ICD-10-CM | POA: Diagnosis not present

## 2023-08-05 DIAGNOSIS — D75839 Thrombocytosis, unspecified: Secondary | ICD-10-CM | POA: Diagnosis not present

## 2023-08-05 DIAGNOSIS — R5382 Chronic fatigue, unspecified: Secondary | ICD-10-CM | POA: Diagnosis not present

## 2023-08-05 DIAGNOSIS — M1991 Primary osteoarthritis, unspecified site: Secondary | ICD-10-CM | POA: Diagnosis not present

## 2023-08-05 DIAGNOSIS — E875 Hyperkalemia: Secondary | ICD-10-CM | POA: Diagnosis not present

## 2023-08-14 ENCOUNTER — Ambulatory Visit: Payer: Medicare PPO | Attending: Cardiology | Admitting: Cardiology

## 2023-08-14 ENCOUNTER — Encounter: Payer: Self-pay | Admitting: Cardiology

## 2023-08-14 VITALS — BP 128/60 | HR 69 | Ht 71.0 in | Wt 173.0 lb

## 2023-08-14 DIAGNOSIS — I10 Essential (primary) hypertension: Secondary | ICD-10-CM | POA: Diagnosis not present

## 2023-08-14 DIAGNOSIS — I25119 Atherosclerotic heart disease of native coronary artery with unspecified angina pectoris: Secondary | ICD-10-CM | POA: Diagnosis not present

## 2023-08-14 DIAGNOSIS — I340 Nonrheumatic mitral (valve) insufficiency: Secondary | ICD-10-CM

## 2023-08-14 DIAGNOSIS — E782 Mixed hyperlipidemia: Secondary | ICD-10-CM

## 2023-08-14 NOTE — Patient Instructions (Addendum)
 Medication Instructions:  Your physician recommends that you continue on your current medications as directed. Please refer to the Current Medication list given to you today.  Labwork: none  Testing/Procedures: Your physician has requested that you have an echocardiogram in July 2025. Echocardiography is a painless test that uses sound waves to create images of your heart. It provides your doctor with information about the size and shape of your heart and how well your heart's chambers and valves are working. This procedure takes approximately one hour. There are no restrictions for this procedure. Please do NOT wear cologne, perfume, aftershave, or lotions (deodorant is allowed). Please arrive 15 minutes prior to your appointment time.  Please note: We ask at that you not bring children with you during ultrasound (echo/ vascular) testing. Due to room size and safety concerns, children are not allowed in the ultrasound rooms during exams. Our front office staff cannot provide observation of children in our lobby area while testing is being conducted. An adult accompanying a patient to their appointment will only be allowed in the ultrasound room at the discretion of the ultrasound technician under special circumstances. We apologize for any inconvenience.  Follow-Up: Your physician recommends that you schedule a follow-up appointment in: 6 months  Any Other Special Instructions Will Be Listed Below (If Applicable).  If you need a refill on your cardiac medications before your next appointment, please call your pharmacy.

## 2023-08-14 NOTE — Progress Notes (Signed)
    Cardiology Office Note  Date: 08/14/2023   ID: Jerrel Mor., DOB May 14, 1945, MRN [id number]  History of Present Illness: Chase Anderson. is a 78 y.o. male last seen in November 2024.  He is here for a routine visit.  Reports no exertional chest pain, stable NYHA class II dyspnea, no palpitations or syncope.  We went over his medications.  He reports compliance with treatment, no change in baseline cardiac regimen.  His blood pressure is well-controlled today.  I did review his interval lab work which is noted below.  He continues to follow regularly with Dr. Arlene Ben for primary care.  I reviewed his ECG today which shows sinus rhythm with prolonged PR interval.  He is due for a follow-up echocardiogram next month for reevaluation of mitral regurgitation.  Physical Exam: VS:  BP 128/60   Pulse 69   Ht 5\' 11"  (1.803 m)   Wt 173 lb (78.5 kg)   SpO2 97%   BMI 24.13 kg/m , BMI Body mass index is 24.13 kg/m.  Wt Readings from Last 3 Encounters:  08/14/23 173 lb (78.5 kg)  03/14/23 177 lb 12.8 oz (80.6 kg)  02/04/23 175 lb (79.4 kg)    General: Patient appears comfortable at rest. HEENT: Conjunctiva and lids normal. Neck: Supple, no elevated JVP or carotid bruits. Lungs: Clear to auscultation, nonlabored breathing at rest. Cardiac: Regular rate and rhythm, no S3, 3/6 mid-to-late apical systolic murmur, no pericardial rub. Extremities: No pitting edema.  ECG:  An ECG dated 07/26/2022 was personally reviewed today and demonstrated:  Sinus rhythm with prolonged PR interval and low voltage, leftward axis.  Labwork: 03/14/2023: ALT 18; AST 26; BUN 21; Creatinine, Ser 1.67; Hemoglobin 15.1; Platelets 899.0; Potassium 4.7; Sodium 135     Component Value Date/Time   CHOL 85 07/17/2022 0908   TRIG 117.0 07/17/2022 0908   HDL 29.30 (L) 07/17/2022 0908   CHOLHDL 3 07/17/2022 0908   VLDL 23.4 07/17/2022 0908   LDLCALC 33 07/17/2022 0908   LDLCALC 41 02/25/2020 0957  April  2025: Hemoglobin 15.1, platelets 909, BUN 17, creatinine 1.62, potassium 5.9, GFR 43, AST 23, ALT 16  Other Studies Reviewed Today:  No interval cardiac testing for review today.  Assessment and Plan:  1.  CAD, mild nonobstructive disease documented at cardiac catheterization in 2004 in the setting of suspected plaque rupture event versus aborted inferior infarct.  Follow-up exercise Myoview  in 2017 suggested inferior infarct scar but no active ischemia.  Echocardiogram in July 2024 revealed LVEF 60 to 65% without regional wall motion abnormalities.  He does not report any angina.  ECG reviewed.  Continue aspirin 81 mg daily and Zocor  20 mg daily.  He has as needed nitroglycerin  available.   2.  Bileaflet mitral valve prolapse associated with moderate mitral regurgitation by follow-up echocardiogram in July 2024.  RVSP normal.  Plan to update echocardiogram next month.   3.  Primary hypertension.  Blood pressure well-controlled today.  Continue Norvasc  10 mg daily and Lopressor  25 mg twice daily.   4.  CKD stage IIIb.  Creatinine 1.62 with GFR 43 in April.   5.  Mixed hyperlipidemia.  Continue Zocor  20 mg daily.  Last LDL 33.  Disposition:  Follow up 6 months.  Signed, Gerard Knight, M.D., F.A.C.C. [facility]

## 2023-09-03 DIAGNOSIS — Z79899 Other long term (current) drug therapy: Secondary | ICD-10-CM | POA: Diagnosis not present

## 2023-09-03 DIAGNOSIS — L4050 Arthropathic psoriasis, unspecified: Secondary | ICD-10-CM | POA: Diagnosis not present

## 2023-09-04 LAB — LAB REPORT - SCANNED: EGFR: 33

## 2023-09-16 ENCOUNTER — Other Ambulatory Visit: Payer: Self-pay | Admitting: Family Medicine

## 2023-09-16 DIAGNOSIS — E7800 Pure hypercholesterolemia, unspecified: Secondary | ICD-10-CM

## 2023-09-17 ENCOUNTER — Encounter: Payer: Medicare PPO | Admitting: Family Medicine

## 2023-09-25 ENCOUNTER — Ambulatory Visit: Payer: Self-pay | Admitting: Cardiology

## 2023-09-25 ENCOUNTER — Ambulatory Visit: Attending: Cardiology

## 2023-09-25 DIAGNOSIS — I340 Nonrheumatic mitral (valve) insufficiency: Secondary | ICD-10-CM | POA: Diagnosis not present

## 2023-09-25 LAB — ECHOCARDIOGRAM COMPLETE
AR max vel: 2.64 cm2
AV Area VTI: 2.73 cm2
AV Area mean vel: 2.61 cm2
AV Mean grad: 3 mmHg
AV Peak grad: 7.2 mmHg
AV Vena cont: 0.6 cm
Ao pk vel: 1.34 m/s
Area-P 1/2: 5.75 cm2
MV M vel: 4.95 m/s
MV Peak grad: 98 mmHg
MV VTI: 1.59 cm2
P 1/2 time: 499 ms
S' Lateral: 3.2 cm
Single Plane A4C EF: 63.1 %

## 2023-10-24 ENCOUNTER — Other Ambulatory Visit: Payer: Self-pay | Admitting: Family Medicine

## 2023-10-29 DIAGNOSIS — Z79899 Other long term (current) drug therapy: Secondary | ICD-10-CM | POA: Diagnosis not present

## 2023-10-29 DIAGNOSIS — Z111 Encounter for screening for respiratory tuberculosis: Secondary | ICD-10-CM | POA: Diagnosis not present

## 2023-10-29 DIAGNOSIS — L4050 Arthropathic psoriasis, unspecified: Secondary | ICD-10-CM | POA: Diagnosis not present

## 2023-10-29 DIAGNOSIS — R5383 Other fatigue: Secondary | ICD-10-CM | POA: Diagnosis not present

## 2023-11-14 ENCOUNTER — Other Ambulatory Visit: Payer: Self-pay | Admitting: Family Medicine

## 2023-12-02 ENCOUNTER — Other Ambulatory Visit: Payer: Self-pay | Admitting: Family Medicine

## 2023-12-02 NOTE — Telephone Encounter (Signed)
 Refill request for Tramadol  50mg  60tab 5refills; last fill 05/06/23; last OV 03/14/23

## 2023-12-10 DIAGNOSIS — K047 Periapical abscess without sinus: Secondary | ICD-10-CM | POA: Diagnosis not present

## 2023-12-10 DIAGNOSIS — Z5181 Encounter for therapeutic drug level monitoring: Secondary | ICD-10-CM | POA: Diagnosis not present

## 2023-12-10 DIAGNOSIS — N1832 Chronic kidney disease, stage 3b: Secondary | ICD-10-CM | POA: Diagnosis not present

## 2023-12-10 DIAGNOSIS — E11319 Type 2 diabetes mellitus with unspecified diabetic retinopathy without macular edema: Secondary | ICD-10-CM | POA: Diagnosis not present

## 2023-12-10 DIAGNOSIS — Z9189 Other specified personal risk factors, not elsewhere classified: Secondary | ICD-10-CM | POA: Diagnosis not present

## 2023-12-10 DIAGNOSIS — D849 Immunodeficiency, unspecified: Secondary | ICD-10-CM | POA: Diagnosis not present

## 2023-12-10 DIAGNOSIS — Z23 Encounter for immunization: Secondary | ICD-10-CM | POA: Diagnosis not present

## 2023-12-10 DIAGNOSIS — D75839 Thrombocytosis, unspecified: Secondary | ICD-10-CM | POA: Diagnosis not present

## 2023-12-10 DIAGNOSIS — Z794 Long term (current) use of insulin: Secondary | ICD-10-CM | POA: Diagnosis not present

## 2023-12-24 DIAGNOSIS — L4050 Arthropathic psoriasis, unspecified: Secondary | ICD-10-CM | POA: Diagnosis not present

## 2023-12-24 DIAGNOSIS — Z79899 Other long term (current) drug therapy: Secondary | ICD-10-CM | POA: Diagnosis not present

## 2023-12-27 LAB — LAB REPORT - SCANNED: EGFR: 42

## 2023-12-29 ENCOUNTER — Ambulatory Visit: Payer: Self-pay | Admitting: Family Medicine

## 2023-12-30 DIAGNOSIS — H401131 Primary open-angle glaucoma, bilateral, mild stage: Secondary | ICD-10-CM | POA: Diagnosis not present

## 2023-12-30 DIAGNOSIS — H2513 Age-related nuclear cataract, bilateral: Secondary | ICD-10-CM | POA: Diagnosis not present

## 2023-12-30 DIAGNOSIS — E113293 Type 2 diabetes mellitus with mild nonproliferative diabetic retinopathy without macular edema, bilateral: Secondary | ICD-10-CM | POA: Diagnosis not present

## 2023-12-30 LAB — OPHTHALMOLOGY REPORT-SCANNED

## 2024-01-02 ENCOUNTER — Encounter: Payer: Self-pay | Admitting: Family Medicine

## 2024-01-02 ENCOUNTER — Ambulatory Visit (INDEPENDENT_AMBULATORY_CARE_PROVIDER_SITE_OTHER): Admitting: Family Medicine

## 2024-01-02 ENCOUNTER — Ambulatory Visit: Payer: Self-pay | Admitting: Family Medicine

## 2024-01-02 VITALS — BP 130/62 | HR 72 | Temp 97.2°F | Ht 71.0 in | Wt 172.4 lb

## 2024-01-02 DIAGNOSIS — I2510 Atherosclerotic heart disease of native coronary artery without angina pectoris: Secondary | ICD-10-CM | POA: Diagnosis not present

## 2024-01-02 DIAGNOSIS — E1169 Type 2 diabetes mellitus with other specified complication: Secondary | ICD-10-CM

## 2024-01-02 DIAGNOSIS — D75839 Thrombocytosis, unspecified: Secondary | ICD-10-CM

## 2024-01-02 DIAGNOSIS — L4050 Arthropathic psoriasis, unspecified: Secondary | ICD-10-CM | POA: Diagnosis not present

## 2024-01-02 DIAGNOSIS — Z7984 Long term (current) use of oral hypoglycemic drugs: Secondary | ICD-10-CM

## 2024-01-02 DIAGNOSIS — N1832 Chronic kidney disease, stage 3b: Secondary | ICD-10-CM

## 2024-01-02 DIAGNOSIS — Z0000 Encounter for general adult medical examination without abnormal findings: Secondary | ICD-10-CM | POA: Diagnosis not present

## 2024-01-02 DIAGNOSIS — E1139 Type 2 diabetes mellitus with other diabetic ophthalmic complication: Secondary | ICD-10-CM

## 2024-01-02 DIAGNOSIS — E785 Hyperlipidemia, unspecified: Secondary | ICD-10-CM | POA: Diagnosis not present

## 2024-01-02 DIAGNOSIS — Z1283 Encounter for screening for malignant neoplasm of skin: Secondary | ICD-10-CM

## 2024-01-02 LAB — URINALYSIS, ROUTINE W REFLEX MICROSCOPIC
Bilirubin Urine: NEGATIVE
Hgb urine dipstick: NEGATIVE
Ketones, ur: NEGATIVE
Leukocytes,Ua: NEGATIVE
Nitrite: NEGATIVE
Specific Gravity, Urine: 1.015 (ref 1.000–1.030)
Total Protein, Urine: NEGATIVE
Urine Glucose: NEGATIVE
Urobilinogen, UA: 0.2 (ref 0.0–1.0)
WBC, UA: NONE SEEN (ref 0–?)
pH: 6 (ref 5.0–8.0)

## 2024-01-02 LAB — LIPID PANEL
Cholesterol: 78 mg/dL (ref 0–200)
HDL: 27.3 mg/dL — ABNORMAL LOW (ref >39.00)
LDL Cholesterol: 26 mg/dL (ref 0–99)
NonHDL: 50.51
Total CHOL/HDL Ratio: 3
Triglycerides: 122 mg/dL (ref 0.0–149.0)
VLDL: 24.4 mg/dL (ref 0.0–40.0)

## 2024-01-02 LAB — POCT GLYCOSYLATED HEMOGLOBIN (HGB A1C): Hemoglobin A1C: 7.3 % — AB (ref 4.0–5.6)

## 2024-01-02 LAB — MICROALBUMIN / CREATININE URINE RATIO
Creatinine,U: 77.6 mg/dL
Microalb Creat Ratio: 35.7 mg/g — ABNORMAL HIGH (ref 0.0–30.0)
Microalb, Ur: 2.8 mg/dL — ABNORMAL HIGH (ref 0.0–1.9)

## 2024-01-02 MED ORDER — VALACYCLOVIR HCL 1 G PO TABS
1000.0000 mg | ORAL_TABLET | Freq: Three times a day (TID) | ORAL | 2 refills | Status: AC
Start: 2024-01-02 — End: ?

## 2024-01-02 NOTE — Progress Notes (Signed)
 Phone: [phone]   Subjective:  Patient presents today for their annual physical. Chief complaint-noted.   See problem oriented charting- ROS- full  review of systems was completed and negative  except for topics noted under acute/chronic concerns  The following were reviewed and entered/updated in epic: Past Medical History:  Diagnosis Date   Abnormality of immune system    Allergic rhinitis    Allergy    Anxiety    Atopic dermatitis    CAD (coronary artery disease)    Mild at cardiac catheterization 2004   Cataract    Not ready   CKD (chronic kidney disease) stage 3, GFR 30-59 ml/min (HCC)    Collagen vascular disease    Depression    Erectile dysfunction    Essential hypertension    GERD (gastroesophageal reflux disease)    Hyperlipidemia    Myocardial infarction (HCC) 2004   Suspected plaque rupture/aborted inferior infarct 2004, nonobstructive CAD managed medically   Osteoarthritis    Psoriatic arthritis (HCC)    Shingles    Type 2 diabetes mellitus (HCC)    Patient Active Problem List   Diagnosis Date Noted   Thrombocytosis 04/03/2016    Priority: High   Depression 03/27/2015    Priority: High   Psoriatic arthritis (HCC) 01/25/2013    Priority: High   Mitral valve prolapse 12/25/2010    Priority: High   Leukocytopenia 11/24/2006    Priority: High   Controlled type 2 diabetes mellitus with ophthalmic complication (HCC) 10/06/2006    Priority: High   CAD (coronary artery disease) with history MI 2004- no intervention 10/06/2006    Priority: High   Mitral valve regurgitation 02/15/2019    Priority: Medium    Dyshidrotic eczema 02/07/2016    Priority: Medium    CKD (chronic kidney disease), stage III (HCC) 09/15/2015    Priority: Medium    History of BPH 12/14/2009    Priority: Medium    Hyperlipidemia associated with type 2 diabetes mellitus (HCC) 10/06/2006    Priority: Medium    Essential hypertension 10/06/2006    Priority: Medium    GERD  (gastroesophageal reflux disease) 08/24/2019    Priority: Low   Diabetic retinopathy (HCC) 02/20/2017    Priority: Low   ERECTILE DYSFUNCTION 11/26/2007    Priority: Low   Candidiasis 11/25/2012   Immunodeficiency disorder with predominant T-cell defect 11/19/2012   Past Surgical History:  Procedure Laterality Date   COLONOSCOPY     ESOPHAGOGASTRODUODENOSCOPY  04/23/2005   KNEE ARTHROSCOPY     right   POLYPECTOMY  2008   TA x 4    TONSILLECTOMY     TOOTH EXTRACTION     x 4 removed    UPPER GASTROINTESTINAL ENDOSCOPY      Family History  Problem Relation Age of Onset   Dementia Mother    Other Mother        abdominal issues led to death- apparently inoperable growth-  likely cancer   Anxiety disorder Mother    COPD Father        led to death   Colon polyps Father    Arthritis Father    Diabetes Brother    Cancer Brother    Leukemia Brother        diagnosed 2025   Colon cancer Neg Hx    Esophageal cancer Neg Hx    Rectal cancer Neg Hx    Stomach cancer Neg Hx     Medications- reviewed and updated Current Outpatient Medications  Medication Sig Dispense Refill   amLODipine  (NORVASC ) 10 MG tablet TAKE 1 TABLET(10 MG) BY MOUTH DAILY 100 tablet 3   aspirin EC 81 MG tablet Take 81 mg by mouth daily.     clindamycin (CLEOCIN) 150 MG capsule PRN     famotidine  (PEPCID ) 20 MG tablet Take 1 tablet (20 mg total) by mouth 2 (two) times daily. 60 tablet 11   fluticasone  (FLONASE ) 50 MCG/ACT nasal spray SHAKE LIQUID AND USE 2 SPRAYS IN EACH NOSTRIL DAILY 16 g 3   glimepiride  (AMARYL ) 4 MG tablet TAKE 1 TABLET(4 MG) BY MOUTH DAILY WITH BREAKFAST 90 tablet 3   glucose blood (ACCU-CHEK AVIVA PLUS) test strip Use to test blood sugars 2 times daily. Dx: E11.9 100 each 3   glucose blood (ACCU-CHEK AVIVA PLUS) test strip USE TO TEST BLOOD GLUCOSE ONCE DAILY 100 strip 3   InFLIXimab  (REMICADE  IV) Inject into the vein every 8 (eight) weeks.     JANUVIA  50 MG tablet TAKE 1 TABLET(50 MG)  BY MOUTH DAILY 90 tablet 3   latanoprost (XALATAN) 0.005 % ophthalmic solution Place 1 drop into both eyes at bedtime.     metFORMIN  (GLUCOPHAGE -XR) 500 MG 24 hr tablet TAKE 2 TABLETS BY MOUTH DAILY WITH BREAKFAST 180 tablet 3   metoprolol  tartrate (LOPRESSOR ) 25 MG tablet TAKE 1 TABLET(25 MG) BY MOUTH TWICE DAILY 180 tablet 3   nitroGLYCERIN  (NITROSTAT ) 0.4 MG SL tablet Place 1 tablet (0.4 mg total) under the tongue every 5 (five) minutes as needed for chest pain. Up to three time and then call the doctor 30 tablet 5   simvastatin  (ZOCOR ) 20 MG tablet TAKE 1 TABLET(20 MG) BY MOUTH AT BEDTIME 90 tablet 1   sulfamethoxazole-trimethoprim (BACTRIM DS,SEPTRA DS) 800-160 MG tablet Take 1 tablet by mouth every Monday, Wednesday, and Friday.  5   tadalafil  (CIALIS ) 20 MG tablet Take 1 tablet (20 mg total) by mouth daily as needed for erectile dysfunction (no nitroglycerin  within 72 hours). 10 tablet 5   traMADol  (ULTRAM ) 50 MG tablet Take 1 tablet (50 mg total) by mouth every 12 (twelve) hours as needed for moderate pain (pain score 4-6). 60 tablet 5   valACYclovir  (VALTREX ) 1000 MG tablet Take 1 tablet (1,000 mg total) by mouth 3 (three) times daily. For 7 days for shingles. 21 tablet 2   No current facility-administered medications for this visit.    Allergies-reviewed and updated Allergies  Allergen Reactions   Amoxicillin     Significant diffuse skin rash/irritation   Codeine     REACTION: itching and rash Other reaction(s): rash   Desvenlafaxine      funny feeling   Lisinopril     Methotrexate Other (See Comments)    Other reaction(s): mouth sores   Penicillin G     Other reaction(s): rash   Penicillins     REACTION: as a child   Pristiq  [Desvenlafaxine  Succinate Er]     funny feeling   Sulfasalazine     Other Reaction(s): Headaches/GI upset    Social History   Social History Narrative   Married. 2 children. 2 stepchildren. 7 grandkids including step   Take care of daughter  of law Kesler Wickham- son left her with 44 year old.       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 Oropharynx normal Neck: no thyromegaly CV: RRR no murmurs rubs or gallops Lungs: CTAB no crackles, wheeze, rhonchi Abdomen: soft/nontender/nondistended/normal bowel sounds. No rebound or guarding.  Ext: no edema Skin: warm, dry Neuro: grossly normal, moves all extremities, PERRLA  Diabetic foot exam was performed with the following findings:   No deformities, ulcerations, or other skin breakdown Normal sensation of 10g monofilament Intact posterior tibialis and dorsalis pedis pulses Callous at base of MTP diffusely and thickened per baseline- no change        Assessment and Plan  78 y.o. male presenting for annual physical.  Health Maintenance counseling: 1. Anticipatory guidance: Patient counseled regarding regular dental exams -q6 months with upcoming partial first of year, eye exams -yearly just seen,  avoiding smoking and second hand smoke , limiting alcohol to 2 beverages per day - none, no illicit drugs .   2. Risk factor reduction:  Advised patient of need for regular exercise and diet rich and fruits and vegetables to reduce risk of heart attack and stroke.  Exercise- goal 150 minutes a week- feels he can improve- still runs chainsaw and some walking.  Diet/weight management-reaosnably stable/healthy weight.  Wt Readings from Last 3 Encounters:  01/02/24 172 lb 6.4 oz (78.2 kg)  08/14/23 173 lb (78.5 kg)  03/14/23 177 lb 12.8 oz (80.6 kg)   3. Immunizations/screenings/ancillary studies-due for Tdap - recommended pharmacy. Did get pcv21 with [facility]   Immunization History  Administered Date(s) Administered   Fluad Quad(high Dose 65+) 11/23/2018, 12/07/2020   Fluad Trivalent(High Dose 65+) 12/10/2023   INFLUENZA, HIGH DOSE SEASONAL PF 02/01/2014, 02/06/2015, 02/07/2016, 12/06/2016   Influenza Split 01/23/2012, 11/19/2012   Influenza Whole 11/26/2007, 12/14/2009   Influenza, Seasonal, Injecte, Preservative Fre 01/05/2009   Influenza,inj,Quad PF,6+ Mos 11/19/2012, 12/30/2019, 12/05/2021   Influenza-Unspecified 01/05/2009, 02/01/2014, 02/06/2015, 02/07/2016, 12/06/2016, 11/27/2017, 11/23/2018, 11/12/2022   Moderna Sars-Covid-2 Vaccination 04/07/2019, 05/05/2019, 01/15/2020, 07/15/2020   Pneumococcal Conjugate Pcv21, Polysaccharide Crm197 Conjugaf 12/10/2023   Pneumococcal Conjugate-13 06/02/2013   Pneumococcal Polysaccharide-23 03/11/2004, 06/29/2009   Td 03/11/2002   Tetanus 03/10/2013   Zoster Recombinant(Shingrix) 06/10/2017, 10/01/2017  4. Prostate cancer screening- past age based screening recommendations   5. Colon cancer screening - 08/23/21 with possible 3 year repeat 6. Skin cancer screening- wants referral to dermatology specialists- has seen DrRONITA Hurst in past. advised regular sunscreen use. Denies worrisome, changing, or new skin lesions.  7. Smoking associated screening (lung cancer screening, AAA screen 65-75, UA)- former smoker- 60 pack years but quit by 2005. No AAA on prior CT abdomen. Get urinalysis with labs. Does not qualify for lung cancer screening due to when he quit 8. STD screening - only active with wife  Status of chronic or acute concerns   #labs with recent rheumatology- CBC and CMP largely reassuring other than sugar  #mild rectal itching with no discharge and no hemorrhoids- may try hydrocortisone 1% twice daily up to 7 days  #left sided rib pain- history of shingles- usually improving if starts quickly. Usually needs 2-3 days and does better. Has had 4 cases in his life. No rash recently. Just saw [facility]. He's  about to run out and needs refill.   # Psoriatic arthritis-follows with rheumatology Dr. Mai # Immunodeficiency-follows with [facility] infectious disease-remains on  Bactrim . Has clindamycin as needed for cellulitis through [facility] S: Medication: Remicade  every 8 weeks , tramadol  helps with pain -Also followed by [facility] infectious disease for immunodeficiency on Bactrim 3 days a week and as needed clindamycin as well as Diflucan A/P: overall stable immunodeficiency and psoriatic arthritis- continue current medications    # CAD-follows with cardiology Dr. Debera #hyperlipidemia S: Medication:Aspirin 81 mg simvastatin  20 mg, nitroglycerin  on hand but has not had to use Symptoms: no chest pain or shortness of breath (other than if congested)  Lab Results  Component Value Date   CHOL 85 07/17/2022   HDL 29.30 (L) 07/17/2022   LDLCALC 33 07/17/2022   TRIG 117.0 07/17/2022   CHOLHDL 3 07/17/2022    A/P: coronary artery disease asymptomatic continue current medications and update lipids  #Mitral regurgitation/prolapse- cardiology follow ups with echocardiogram  # Diabetes- a1c goal 7.5 or even 8 S: Medication: glimepiride  4 mg, januvia  50 mg, metformin  1000mg  ER in AM CBGs- sugar 106 this morning Exercise and diet- feels he has been overeating some things he shouldn't- sweet snacks for instance Lab Results  Component Value Date   HGBA1C 7.3 (A) 01/02/2024   HGBA1C 7.0 (A) 03/14/2023   HGBA1C 6.9 (H) 11/12/2022   A/P: he wants to make dietary modifications and hold steady on medicine- continue current medications    #hypertension # Chronic kidney disease stage IIIb  with history of hyperkalemia- last GFR in 40s S: medication: Amlodipine  10 mg, metoprolol  25 mg twice daily  -Has not tolerated ACE inhibitor in the past with hyperkalemia issues  BP Readings from Last 3 Encounters:  01/02/24 130/62  08/14/23 128/60  03/14/23 109/69   A/P: blood pressure well controlled continue current  medications  CKD just checked- stable- continue to monitor   # Depression- no recurrence- doing well off meds    01/02/2024    8:20 AM 03/14/2023    9:26 AM 01/30/2023    8:10 AM  Depression screen PHQ 2/9  Decreased Interest 0 0 0  Down, Depressed, Hopeless 0 0 0  PHQ - 2 Score 0 0 0  Altered sleeping 0 0   Tired, decreased energy 0 0   Change in appetite 3 0   Feeling bad or failure about yourself  0 0   Trouble concentrating 0 0   Moving slowly or fidgety/restless 0 0   Suicidal thoughts 0 0   PHQ-9 Score 3 0   Difficult doing work/chores Not difficult at all Not difficult at all    # B12 deficiency- off treatment as reported hallucinations on b12  #allergies- intermittent zyrtec/Flonase - tough last 4 months but has not restarted these. Has trie benadryl advised against. Some cough- but has improved.   #glaucoma- now on drops in addition to having known retinopathy   #thrombocytosis- chronic- we previously called 04/04/21-Dr. Librada HOUSTON- less worried about thrombocytosis unless regularly over 1k. can do e consult for oncology if over 1k again  Recommended follow up: Return in about 4 months (around 05/04/2024) for followup or sooner if needed.Schedule b4 you leave. Future Appointments  Date Time Provider Department Center  02/10/2024  8:00 AM [facility] ANNUAL WELLNESS VISIT 1 [facility] [place]   Lab/Order associations: fasting   ICD-10-CM   1. Preventative health care  Z00.00     2. Controlled type 2 diabetes mellitus with ophthalmic complication (HCC)  E11.39 POCT HgB A1C    Microalbumin / creatinine urine ratio    Urinalysis, Routine w reflex microscopic  3. Stage 3b chronic kidney disease (HCC)  N18.32     4. Coronary artery disease involving native coronary artery of native heart without angina pectoris  I25.10     5. Psoriatic arthritis (HCC)  L40.50     6. Thrombocytosis  D75.839     7. Hyperlipidemia associated with type 2 diabetes mellitus (HCC)  E11.69  Lipid panel   E78.5     8. Screening exam for skin cancer  Z12.83 Ambulatory referral to Dermatology      Meds ordered this encounter  Medications   valACYclovir  (VALTREX ) 1000 MG tablet    Sig: Take 1 tablet (1,000 mg total) by mouth 3 (three) times daily. For 7 days for shingles.    Dispense:  21 tablet    Refill:  2    Return precautions advised.  Garnette Lukes, MD

## 2024-01-02 NOTE — Patient Instructions (Addendum)
 Please stop by lab before you go If you have mychart- we will send your results within 3 business days of us  receiving them.  If you do not have mychart- we will call you about results within 5 business days of us  receiving them.  *please also note that you will see labs on mychart as soon as they post. I will later go in and write notes on them- will say notes from Dr. Katrinka   #mild rectal itching with no discharge and no hemorrhoids- may try hydrocortisone 1% twice daily up to 7 days  We have placed a referral for you today to dermatology specailists- please call their # if you do not hear within a week (may be listed below or you may see mychart message within a few days with #).   A1c up to 7.3- he wants to make dietary modifications and hold steady on medicine- continue current medications     Recommended follow up: Return in about 4 months (around 05/04/2024) for followup or sooner if needed.Schedule b4 you leave.

## 2024-01-07 ENCOUNTER — Other Ambulatory Visit: Payer: Self-pay | Admitting: Family Medicine

## 2024-01-07 DIAGNOSIS — I10 Essential (primary) hypertension: Secondary | ICD-10-CM

## 2024-01-07 MED ORDER — TELMISARTAN 20 MG PO TABS: 20.0000 mg | ORAL_TABLET | Freq: Every day | ORAL | 11 refills | Status: DC

## 2024-01-23 ENCOUNTER — Ambulatory Visit: Payer: Self-pay | Admitting: Family Medicine

## 2024-01-23 ENCOUNTER — Other Ambulatory Visit (INDEPENDENT_AMBULATORY_CARE_PROVIDER_SITE_OTHER)

## 2024-01-23 DIAGNOSIS — I10 Essential (primary) hypertension: Secondary | ICD-10-CM | POA: Diagnosis not present

## 2024-01-23 LAB — BASIC METABOLIC PANEL WITH GFR
BUN: 26 mg/dL — ABNORMAL HIGH (ref 6–23)
CO2: 26 meq/L (ref 19–32)
Calcium: 9.3 mg/dL (ref 8.4–10.5)
Chloride: 100 meq/L (ref 96–112)
Creatinine, Ser: 1.67 mg/dL — ABNORMAL HIGH (ref 0.40–1.50)
GFR: 38.85 mL/min — ABNORMAL LOW (ref >60.00)
Glucose, Bld: 229 mg/dL — ABNORMAL HIGH (ref 70–99)
Potassium: 4.9 meq/L (ref 3.5–5.1)
Sodium: 132 meq/L — ABNORMAL LOW (ref 135–145)

## 2024-02-02 DIAGNOSIS — Z6824 Body mass index (BMI) 24.0-24.9, adult: Secondary | ICD-10-CM | POA: Diagnosis not present

## 2024-02-02 DIAGNOSIS — M1991 Primary osteoarthritis, unspecified site: Secondary | ICD-10-CM | POA: Diagnosis not present

## 2024-02-02 DIAGNOSIS — N183 Chronic kidney disease, stage 3 unspecified: Secondary | ICD-10-CM | POA: Diagnosis not present

## 2024-02-02 DIAGNOSIS — R5382 Chronic fatigue, unspecified: Secondary | ICD-10-CM | POA: Diagnosis not present

## 2024-02-02 DIAGNOSIS — D75839 Thrombocytosis, unspecified: Secondary | ICD-10-CM | POA: Diagnosis not present

## 2024-02-02 DIAGNOSIS — M79645 Pain in left finger(s): Secondary | ICD-10-CM | POA: Diagnosis not present

## 2024-02-02 DIAGNOSIS — E875 Hyperkalemia: Secondary | ICD-10-CM | POA: Diagnosis not present

## 2024-02-02 DIAGNOSIS — L4050 Arthropathic psoriasis, unspecified: Secondary | ICD-10-CM | POA: Diagnosis not present

## 2024-02-10 ENCOUNTER — Ambulatory Visit: Payer: Medicare PPO

## 2024-02-10 VITALS — BP 115/69 | Ht 71.0 in | Wt 172.0 lb

## 2024-02-10 DIAGNOSIS — Z0000 Encounter for general adult medical examination without abnormal findings: Secondary | ICD-10-CM | POA: Diagnosis not present

## 2024-02-10 NOTE — Patient Instructions (Signed)
 Chase Anderson,  Thank you for taking the time for your Medicare Wellness Visit. I appreciate your continued commitment to your health goals. Please review the care plan we discussed, and feel free to reach out if I can assist you further.  Please note that Annual Wellness Visits do not include a physical exam. Some assessments may be limited, especially if the visit was conducted virtually. If needed, we may recommend an in-person follow-up with your provider.  Ongoing Care Seeing your primary care provider every 3 to 6 months helps us  monitor your health and provide consistent, personalized care.   Referrals If a referral was made during today's visit and you haven't received any updates within two weeks, please contact the referred provider directly to check on the status.  Recommended Screenings:  Health Maintenance  Topic Date Due   COVID-19 Vaccine (5 - 2025-26 season) 11/10/2023   Medicare Annual Wellness Visit  01/30/2024   DTaP/Tdap/Td vaccine (3 - Tdap) 01/01/2025*   Hemoglobin A1C  07/02/2024   Colon Cancer Screening  08/23/2024   Eye exam for diabetics  12/29/2024   Yearly kidney health urinalysis for diabetes  01/01/2025   Complete foot exam   01/01/2025   Yearly kidney function blood test for diabetes  01/22/2025   Pneumococcal Vaccine for age over 68  Completed   Flu Shot  Completed   Hepatitis C Screening  Completed   Zoster (Shingles) Vaccine  Completed   Meningitis B Vaccine  Aged Out  *Topic was postponed. The date shown is not the original due date.       01/30/2023    8:10 AM  Advanced Directives  Does Patient Have a Medical Advance Directive? No  Would patient like information on creating a medical advance directive? No - Patient declined    Vision: Annual vision screenings are recommended for early detection of glaucoma, cataracts, and diabetic retinopathy. These exams can also reveal signs of chronic conditions such as diabetes and high blood  pressure.  Dental: Annual dental screenings help detect early signs of oral cancer, gum disease, and other conditions linked to overall health, including heart disease and diabetes.  Please see the attached documents for additional preventive care recommendations.

## 2024-02-10 NOTE — Progress Notes (Addendum)
 Chief Complaint  Patient presents with   Medicare Wellness     Subjective:   Chase Anderson. is a 78 y.o. male who presents for a Medicare Annual Wellness Visit.  Visit info / Clinical Intake: Medicare Wellness Visit Type:: Subsequent Annual Wellness Visit Persons participating in visit and providing information:: patient Medicare Wellness Visit Mode:: Telephone If telephone:: video declined Since this visit was completed virtually, some vitals may be partially provided or unavailable. Missing vitals are due to the limitations of the virtual format.: Documented vitals are patient reported If Telephone or Video please confirm:: I connected with patient using audio/video enable telemedicine. I verified patient identity with two identifiers, discussed telehealth limitations, and patient agreed to proceed. Patient Location:: home Provider Location:: home office Interpreter Needed?: No Pre-visit prep was completed: no AWV questionnaire completed by patient prior to visit?: no Living arrangements:: lives with spouse/significant other Patient's Overall Health Status Rating: good Typical amount of pain: some Does pain affect daily life?: no Are you currently prescribed opioids?: (!) yes  Dietary Habits and Nutritional Risks How many meals a day?: 3 Eats fruit and vegetables daily?: yes Most meals are obtained by: preparing own meals; eating out In the last 2 weeks, have you had any of the following?: none Diabetic:: (!) yes (99 before breakfast) Any non-healing wounds?: no How often do you check your BS?: 1 Would you like to be referred to a Nutritionist or for Diabetic Management? : no  Functional Status Activities of Daily Living (to include ambulation/medication): Independent Ambulation: Independent with device- listed below Home Assistive Devices/Equipment: Eyeglasses Medication Administration: Independent Home Management (perform basic housework or laundry):  Independent Manage your own finances?: yes Primary transportation is: driving Concerns about vision?: no *vision screening is required for WTM* Concerns about hearing?: (!) yes (slight HOH) Uses hearing aids?: no Hear whispered voice?: yes  Fall Screening Falls in the past year?: 0 Number of falls in past year: 0 Was there an injury with Fall?: 0 Fall Risk Category Calculator: 0 Patient Fall Risk Level: Low Fall Risk  Fall Risk Patient at Risk for Falls Due to: No Fall Risks Fall risk Follow up: Falls prevention discussed  Home and Transportation Safety: All rugs have non-skid backing?: N/A, no rugs All stairs or steps have railings?: yes Grab bars in the bathtub or shower?: (!) no Have non-skid surface in bathtub or shower?: (!) no Good home lighting?: yes Regular seat belt use?: yes Hospital stays in the last year:: no  Cognitive Assessment Difficulty concentrating, remembering, or making decisions? : yes Will 6CIT or Mini Cog be Completed: yes What year is it?: 0 points What month is it?: 0 points Give patient an address phrase to remember (5 components): [address] [place]  About what time is it?: 0 points Count backwards from 20 to 1: 0 points Say the months of the year in reverse: 0 points  Advance Directives (For Healthcare) Does Patient Have a Medical Advance Directive?: No Would patient like information on creating a medical advance directive?: No - Patient declined  Reviewed/Updated  Reviewed/Updated: Reviewed All (Medical, Surgical, Family, Medications, Allergies, Care Teams, Patient Goals)    Allergies (verified) Amoxicillin, Codeine, Desvenlafaxine , Lisinopril , Methotrexate, Penicillin g, Penicillins, Pristiq  [desvenlafaxine  succinate er], and Sulfasalazine   Current Medications (verified) Outpatient Encounter Medications as of 02/10/2024  Medication Sig   amLODipine  (NORVASC ) 10 MG tablet TAKE 1 TABLET(10 MG) BY MOUTH DAILY   aspirin EC 81 MG  tablet Take 81 mg by  mouth daily.   clindamycin (CLEOCIN) 150 MG capsule PRN   famotidine  (PEPCID ) 20 MG tablet Take 1 tablet (20 mg total) by mouth 2 (two) times daily.   fluticasone  (FLONASE ) 50 MCG/ACT nasal spray SHAKE LIQUID AND USE 2 SPRAYS IN EACH NOSTRIL DAILY   glimepiride  (AMARYL ) 4 MG tablet TAKE 1 TABLET(4 MG) BY MOUTH DAILY WITH BREAKFAST   glucose blood (ACCU-CHEK AVIVA PLUS) test strip Use to test blood sugars 2 times daily. Dx: E11.9   InFLIXimab  (REMICADE  IV) Inject into the vein every 8 (eight) weeks.   JANUVIA  50 MG tablet TAKE 1 TABLET(50 MG) BY MOUTH DAILY   latanoprost (XALATAN) 0.005 % ophthalmic solution Place 1 drop into both eyes at bedtime.   metFORMIN  (GLUCOPHAGE -XR) 500 MG 24 hr tablet TAKE 2 TABLETS BY MOUTH DAILY WITH BREAKFAST   metoprolol  tartrate (LOPRESSOR ) 25 MG tablet TAKE 1 TABLET(25 MG) BY MOUTH TWICE DAILY   nitroGLYCERIN  (NITROSTAT ) 0.4 MG SL tablet Place 1 tablet (0.4 mg total) under the tongue every 5 (five) minutes as needed for chest pain. Up to three time and then call the doctor   simvastatin  (ZOCOR ) 20 MG tablet TAKE 1 TABLET(20 MG) BY MOUTH AT BEDTIME   sulfamethoxazole-trimethoprim (BACTRIM DS,SEPTRA DS) 800-160 MG tablet Take 1 tablet by mouth every Monday, Wednesday, and Friday.   tadalafil  (CIALIS ) 20 MG tablet Take 1 tablet (20 mg total) by mouth daily as needed for erectile dysfunction (no nitroglycerin  within 72 hours).   telmisartan  (MICARDIS ) 20 MG tablet Take 1 tablet (20 mg total) by mouth daily.   traMADol  (ULTRAM ) 50 MG tablet Take 1 tablet (50 mg total) by mouth every 12 (twelve) hours as needed for moderate pain (pain score 4-6).   valACYclovir  (VALTREX ) 1000 MG tablet Take 1 tablet (1,000 mg total) by mouth 3 (three) times daily. For 7 days for shingles.   [DISCONTINUED] glucose blood (ACCU-CHEK AVIVA PLUS) test strip USE TO TEST BLOOD GLUCOSE ONCE DAILY   No facility-administered encounter medications on file as of 02/10/2024.     History: Past Medical History:  Diagnosis Date   Abnormality of immune system    Allergic rhinitis    Allergy    Anxiety    Atopic dermatitis    CAD (coronary artery disease)    Mild at cardiac catheterization 2004   Cataract    Not ready   CKD (chronic kidney disease) stage 3, GFR 30-59 ml/min (HCC)    Collagen vascular disease    Depression    Erectile dysfunction    Essential hypertension    GERD (gastroesophageal reflux disease)    Hyperlipidemia    Myocardial infarction (HCC) 2004   Suspected plaque rupture/aborted inferior infarct 2004, nonobstructive CAD managed medically   Osteoarthritis    Psoriatic arthritis (HCC)    Shingles    Type 2 diabetes mellitus (HCC)    Past Surgical History:  Procedure Laterality Date   COLONOSCOPY     ESOPHAGOGASTRODUODENOSCOPY  04/23/2005   KNEE ARTHROSCOPY     right   POLYPECTOMY  2008   TA x 4    TONSILLECTOMY     TOOTH EXTRACTION     x 4 removed    UPPER GASTROINTESTINAL ENDOSCOPY     Family History  Problem Relation Age of Onset   Dementia Mother    Other Mother        abdominal issues led to death- apparently inoperable growth-  likely cancer   Anxiety disorder Mother    COPD Father  led to death   Colon polyps Father    Arthritis Father    Diabetes Brother    Cancer Brother    Leukemia Brother        diagnosed 2025   Colon cancer Neg Hx    Esophageal cancer Neg Hx    Rectal cancer Neg Hx    Stomach cancer Neg Hx    Social History   Occupational History   Not on file  Tobacco Use   Smoking status: Former    Current packs/day: 0.00    Average packs/day: 2.0 packs/day for 30.0 years (60.0 ttl pk-yrs)    Types: Cigarettes    Start date: 12/10/1971    Quit date: 12/09/2001    Years since quitting: 22.1   Smokeless tobacco: Former    Types: Chew    Quit date: 03/11/2002  Vaping Use   Vaping status: Never Used  Substance and Sexual Activity   Alcohol use: No   Drug use: No   Sexual activity:  Not Currently   Tobacco Counseling Counseling given: Not Answered  SDOH Screenings   Food Insecurity: No Food Insecurity (02/10/2024)  Housing: Unknown (02/10/2024)  Transportation Needs: No Transportation Needs (02/10/2024)  Utilities: Not At Risk (02/10/2024)  Depression (PHQ2-9): Low Risk  (02/10/2024)  Financial Resource Strain: Low Risk  (12/30/2023)  Physical Activity: Inactive (02/10/2024)  Social Connections: Moderately Integrated (02/10/2024)  Stress: No Stress Concern Present (02/10/2024)  Tobacco Use: Medium Risk (02/10/2024)  Health Literacy: Adequate Health Literacy (02/10/2024)   See flowsheets for full screening details  Depression Screen PHQ 2 & 9 Depression Scale- Over the past 2 weeks, how often have you been bothered by any of the following problems? Little interest or pleasure in doing things: 0 Feeling down, depressed, or hopeless (PHQ Adolescent also includes...irritable): 0 PHQ-2 Total Score: 0 Trouble falling or staying asleep, or sleeping too much: 0 Feeling tired or having little energy: 0 Poor appetite or overeating (PHQ Adolescent also includes...weight loss): 0 Feeling bad about yourself - or that you are a failure or have let yourself or your family down: 0 Trouble concentrating on things, such as reading the newspaper or watching television (PHQ Adolescent also includes...like school work): 0 Moving or speaking so slowly that other people could have noticed. Or the opposite - being so fidgety or restless that you have been moving around a lot more than usual: 0 Thoughts that you would be better off dead, or of hurting yourself in some way: 0 PHQ-9 Total Score: 0 If you checked off any problems, how difficult have these problems made it for you to do your work, take care of things at home, or get along with other people?: Not difficult at all  Depression Treatment Depression Interventions/Treatment : EYV7-0 Score <4 Follow-up Not Indicated     Goals  Addressed               This Visit's Progress     maintain health and activity (pt-stated)        Maintain health and activity              Objective:    Today's Vitals   02/10/24 0801  BP: 115/69  Weight: 172 lb (78 kg)  Height: 5' 11 (1.803 m)   Body mass index is 23.99 kg/m.  Hearing/Vision screen Hearing Screening - Comments:: Pt stated HOH  Vision Screening - Comments:: Wears rx glasses - up to date with routine eye exams with Dr Darroll  Immunizations  and Health Maintenance Health Maintenance  Topic Date Due   COVID-19 Vaccine (5 - 2025-26 season) 11/10/2023   DTaP/Tdap/Td (3 - Tdap) 01/01/2025 (Originally 03/11/2023)   HEMOGLOBIN A1C  07/02/2024   Colonoscopy  08/23/2024   OPHTHALMOLOGY EXAM  12/29/2024   Diabetic kidney evaluation - Urine ACR  01/01/2025   FOOT EXAM  01/01/2025   Diabetic kidney evaluation - eGFR measurement  01/22/2025   Medicare Annual Wellness (AWV)  02/09/2025   Pneumococcal Vaccine: 50+ Years  Completed   Influenza Vaccine  Completed   Hepatitis C Screening  Completed   Zoster Vaccines- Shingrix  Completed   Meningococcal B Vaccine  Aged Out        Assessment/Plan:  This is a routine wellness examination for Elsie.  Patient Care Team: Katrinka Garnette KIDD, MD as PCP - General (Family Medicine) Debera Jayson MATSU, MD as PCP - Cardiology (Cardiology) Lenon Faden, MD as Consulting Physician (Rheumatology) Harless Estefana Marseille, MD as Consulting Physician (Infectious Diseases) Mai Lynwood FALCON, MD as Consulting Physician (Rheumatology) Darroll Anes, DO as Consulting Physician (Optometry)  I have personally reviewed and noted the following in the patient's chart:   Medical and social history Use of alcohol, tobacco or illicit drugs  Current medications and supplements including opioid prescriptions. Functional ability and status Nutritional status Physical activity Advanced directives List of other  physicians Hospitalizations, surgeries, and ER visits in previous 12 months Vitals Screenings to include cognitive, depression, and falls Referrals and appointments  No orders of the defined types were placed in this encounter.  In addition, I have reviewed and discussed with patient certain preventive protocols, quality metrics, and best practice recommendations. A written personalized care plan for preventive services as well as general preventive health recommendations were provided to patient.   Ellouise VEAR Haws, LPN   [id number]   Return in 1 year (on 02/09/2025).  After Visit Summary: (MyChart) Due to this being a telephonic visit, the after visit summary with patients personalized plan was offered to patient via MyChart   Nurse Notes: none

## 2024-02-10 NOTE — Addendum Note (Signed)
 Addended by: JAYLENE ELLOUISE DEL on: 02/10/2024 09:05 AM   Modules accepted: Orders

## 2024-02-10 NOTE — Addendum Note (Signed)
 Addended by: JAYLENE ELLOUISE DEL on: 02/10/2024 09:04 AM   Modules accepted: Orders

## 2024-02-18 DIAGNOSIS — L4050 Arthropathic psoriasis, unspecified: Secondary | ICD-10-CM | POA: Diagnosis not present

## 2024-02-19 LAB — LAB REPORT - SCANNED: EGFR: 43

## 2024-03-20 ENCOUNTER — Other Ambulatory Visit: Payer: Self-pay | Admitting: Family Medicine

## 2024-03-20 DIAGNOSIS — E7800 Pure hypercholesterolemia, unspecified: Secondary | ICD-10-CM

## 2024-03-25 ENCOUNTER — Other Ambulatory Visit: Payer: Self-pay | Admitting: Family Medicine

## 2024-03-29 ENCOUNTER — Ambulatory Visit: Payer: Self-pay

## 2024-03-29 NOTE — Telephone Encounter (Signed)
 FYI Only or Action Required?: Action required by provider: decline ER/UC requesting appointment .  Patient was last seen in primary care on 01/02/2024 by Katrinka Garnette KIDD, MD.  Called Nurse Triage reporting Joint Swelling, Shortness of Breath, and Cough.  Symptoms began several weeks ago.  Interventions attempted: Nothing.  Symptoms are: stable.  Triage Disposition: Go to ED Now (or PCP Triage)  Patient/caregiver understands and will follow disposition?: No, wishes to speak with PCP    Reason for Disposition  [1] Difficulty breathing with exertion (e.g., walking) and [2] NEW or getting WORSE  Answer Assessment - Initial Assessment Questions Patient reports PCP started medication around 03/02/24  telmisaratn had had blood work since starting this. Since then issues wondering if side effects. Lots of congestion. Breathing problems , shortness of breath with exertion this is new and has it have cough more dry  not short of breath at rest. Some wheeze. Swelling both ankle localized . Very fatigued hardly stay awake. Pain in stomach when waking up not sure if gas but stomach is getting hard bloated feeling stays that way can be painful . All these symptoms noticed after starting new medication. This RN advised ER/UC for swelling both ankles worsening and shortness of breath with  exertion patient states would have gone to ER but feels shortness of breath  with exertion is better not worse wants to be seen requesting  call back. This Rn will call CAL.     1. ONSET: When did the swelling start? (e.g., minutes, hours, days)   Thinks started after starting new telmisartan   around end of December  2. LOCATION: What part of the leg is swollen?  Are both legs swollen or just one leg?     Both ankles swelling ,. This has gotten worse 3. SEVERITY: How bad is the swelling? (e.g., localized; mild, moderate, severe)     Severe swelling both ankles  4. REDNESS: Is there redness or signs of  infection?     Denies  5. PAIN: Is the swelling painful to touch? If Yes, ask: How painful is it?   (Scale 1-10; mild, moderate or severe)     Denies  6. FEVER: Do you have a fever? If Yes, ask: What is it, how was it measured, and when did it start?      Denies  7. CAUSE: What do you think is causing the leg swelling?     Denies  8. MEDICAL HISTORY: Do you have a history of blood clots (e.g., DVT), cancer, heart failure, kidney disease, or liver failure?     Hypertension, CAD per chart   9. RECURRENT SYMPTOM: Have you had leg swelling before? If Yes, ask: When was the last time? What happened that time?     Not had this before  10. OTHER SYMPTOMS: Do you have any other symptoms? (e.g., chest pain, difficulty breathing)       Shortness of breath with exertion, dry cough, fatigue   Patient denies the following chest pain, shortness of breath at rest ,fever  Protocols used: Leg Swelling and Edema-A-AH  Message from [place] R sent at 03/29/2024 10:20 AM EST  Reason for Triage: Patient thinks he is side effects from his medication, has fluid/swelling around his ankles, respiratory problems- a lot of congestion with productive cough, and has been having a lot of fatigue.

## 2024-03-29 NOTE — Telephone Encounter (Signed)
 Please see patient message and advise

## 2024-03-29 NOTE — Telephone Encounter (Signed)
 Called and spoke with Etta at office , advised patient decline ER and wants to be seen. This RN advised patients PCP medical assistant team will be notified

## 2024-03-29 NOTE — Telephone Encounter (Signed)
 Visit 4 pm tomorrow with chronicity was scheduled- my message to [facility] team/Taylor was for him to be informed to seek care immediately

## 2024-03-30 ENCOUNTER — Ambulatory Visit

## 2024-03-30 ENCOUNTER — Encounter: Payer: Self-pay | Admitting: Family Medicine

## 2024-03-30 ENCOUNTER — Ambulatory Visit: Admitting: Family Medicine

## 2024-03-30 VITALS — BP 112/60 | HR 110 | Temp 97.7°F | Ht 71.0 in | Wt 188.6 lb

## 2024-03-30 DIAGNOSIS — I10 Essential (primary) hypertension: Secondary | ICD-10-CM

## 2024-03-30 DIAGNOSIS — N183 Chronic kidney disease, stage 3 unspecified: Secondary | ICD-10-CM

## 2024-03-30 DIAGNOSIS — E785 Hyperlipidemia, unspecified: Secondary | ICD-10-CM | POA: Diagnosis not present

## 2024-03-30 DIAGNOSIS — R609 Edema, unspecified: Secondary | ICD-10-CM

## 2024-03-30 DIAGNOSIS — R809 Proteinuria, unspecified: Secondary | ICD-10-CM

## 2024-03-30 DIAGNOSIS — E1169 Type 2 diabetes mellitus with other specified complication: Secondary | ICD-10-CM | POA: Diagnosis not present

## 2024-03-30 DIAGNOSIS — R0602 Shortness of breath: Secondary | ICD-10-CM

## 2024-03-30 DIAGNOSIS — Z7984 Long term (current) use of oral hypoglycemic drugs: Secondary | ICD-10-CM

## 2024-03-30 DIAGNOSIS — R0989 Other specified symptoms and signs involving the circulatory and respiratory systems: Secondary | ICD-10-CM | POA: Diagnosis not present

## 2024-03-30 DIAGNOSIS — L4050 Arthropathic psoriasis, unspecified: Secondary | ICD-10-CM

## 2024-03-30 MED ORDER — AMLODIPINE BESYLATE 5 MG PO TABS: 5.0000 mg | ORAL_TABLET | Freq: Every day | ORAL | 3 refills | Status: AC

## 2024-03-30 NOTE — Patient Instructions (Addendum)
 Amlodipine  can be associated with swelling- decrease to 5 mg- I sent in new prescription   Hoping swelling improves with reducing amlodipine  but you may need other medications or more workup  Please stop by lab before you go If you have mychart- we will send your results within 3 business days of us  receiving them.  If you do not have mychart- we will call you about results within 5 business days of us  receiving them.  *please also note that you will see labs on mychart as soon as they post. I will later go in and write notes on them- will say notes from Dr. Evalette Montrose   X-ray before you go  Recommended follow up: schedule February 3rd for same day visit/follow up on your way out- this is a same day slot but I'm asking them to place you in it

## 2024-03-30 NOTE — Progress Notes (Signed)
 " Phone [phone] In person visit   Subjective:   Chase Anderson. is a 79 y.o. year old very pleasant male patient who presents for/with See problem oriented charting Chief Complaint  Patient presents with   Shortness of Breath    Pt states he thinks he's having a reaction to a new medication he was put on in December; chest congestion   Swelling    Bilateral ankle, feet, hand and finger swelling, bloated abdomen, lots of gas;     Past Medical History-  Patient Active Problem List   Diagnosis Date Noted   Thrombocytosis 04/03/2016    Priority: High   Depression 03/27/2015    Priority: High   Psoriatic arthritis (HCC) 01/25/2013    Priority: High   Mitral valve prolapse 12/25/2010    Priority: High   Leukocytopenia 11/24/2006    Priority: High   Controlled type 2 diabetes mellitus with ophthalmic complication (HCC) 10/06/2006    Priority: High   CAD (coronary artery disease) with history MI 2004- no intervention 10/06/2006    Priority: High   Mitral valve regurgitation 02/15/2019    Priority: Medium    Dyshidrotic eczema 02/07/2016    Priority: Medium    CKD (chronic kidney disease), stage III (HCC) 09/15/2015    Priority: Medium    History of BPH 12/14/2009    Priority: Medium    Hyperlipidemia associated with type 2 diabetes mellitus (HCC) 10/06/2006    Priority: Medium    Essential hypertension 10/06/2006    Priority: Medium    GERD (gastroesophageal reflux disease) 08/24/2019    Priority: Low   Diabetic retinopathy (HCC) 02/20/2017    Priority: Low   ERECTILE DYSFUNCTION 11/26/2007    Priority: Low   Candidiasis 11/25/2012   Immunodeficiency disorder with predominant T-cell defect 11/19/2012    Medications- reviewed and updated Current Outpatient Medications  Medication Sig Dispense Refill   amLODipine  (NORVASC ) 5 MG tablet Take 1 tablet (5 mg total) by mouth daily. 90 tablet 3   aspirin EC 81 MG tablet Take 81 mg by mouth daily.     clindamycin  (CLEOCIN) 150 MG capsule PRN     famotidine  (PEPCID ) 20 MG tablet TAKE 1 TABLET(20 MG) BY MOUTH TWICE DAILY AS NEEDED FOR HEARTBURN OR INDIGESTION 60 tablet 11   fluticasone  (FLONASE ) 50 MCG/ACT nasal spray SHAKE LIQUID AND USE 2 SPRAYS IN EACH NOSTRIL DAILY 16 g 3   glimepiride  (AMARYL ) 4 MG tablet TAKE 1 TABLET(4 MG) BY MOUTH DAILY WITH BREAKFAST 90 tablet 3   glucose blood (ACCU-CHEK AVIVA PLUS) test strip Use to test blood sugars 2 times daily. Dx: E11.9 100 each 3   InFLIXimab  (REMICADE  IV) Inject into the vein every 8 (eight) weeks.     JANUVIA  50 MG tablet TAKE 1 TABLET(50 MG) BY MOUTH DAILY 90 tablet 3   latanoprost (XALATAN) 0.005 % ophthalmic solution Place 1 drop into both eyes at bedtime.     metFORMIN  (GLUCOPHAGE -XR) 500 MG 24 hr tablet TAKE 2 TABLETS BY MOUTH DAILY WITH BREAKFAST 180 tablet 3   metoprolol  tartrate (LOPRESSOR ) 25 MG tablet TAKE 1 TABLET(25 MG) BY MOUTH TWICE DAILY 180 tablet 3   nitroGLYCERIN  (NITROSTAT ) 0.4 MG SL tablet Place 1 tablet (0.4 mg total) under the tongue every 5 (five) minutes as needed for chest pain. Up to three time and then call the doctor 30 tablet 5   simvastatin  (ZOCOR ) 20 MG tablet TAKE 1 TABLET(20 MG) BY MOUTH AT BEDTIME 90 tablet 1  sulfamethoxazole-trimethoprim (BACTRIM DS,SEPTRA DS) 800-160 MG tablet Take 1 tablet by mouth every Monday, Wednesday, and Friday.  5   tadalafil  (CIALIS ) 20 MG tablet Take 1 tablet (20 mg total) by mouth daily as needed for erectile dysfunction (no nitroglycerin  within 72 hours). 10 tablet 5   telmisartan  (MICARDIS ) 20 MG tablet Take 1 tablet (20 mg total) by mouth daily. 30 tablet 11   traMADol  (ULTRAM ) 50 MG tablet Take 1 tablet (50 mg total) by mouth every 12 (twelve) hours as needed for moderate pain (pain score 4-6). 60 tablet 5   valACYclovir  (VALTREX ) 1000 MG tablet Take 1 tablet (1,000 mg total) by mouth 3 (three) times daily. For 7 days for shingles. 21 tablet 2   No current facility-administered  medications for this visit.     Objective:  BP 112/60 (BP Location: Left Arm, Patient Position: Sitting, Cuff Size: Normal)   Pulse (!) 110   Temp 97.7 F (36.5 C) (Temporal)   Ht 5' 11 (1.803 m)   Wt 188 lb 9.6 oz (85.5 kg)   SpO2 96%   BMI 26.30 kg/m  Gen: NAD, resting comfortably CV: RRR no murmurs rubs or gallops Lungs: CTAB no crackles, wheeze, rhonchi Abdomen: Diffuse tenderness and some bloating noted-reports regular bowel movements Ext: 1-2+ pitting edema edema Skin: warm, dry     Assessment and Plan   # shortness of breath, edema S: Patient reports bilateral ankle, feet, hand and finger swelling as well as bloated abdomen and a lot of gas - has had some mild issues like this in the past but never seen anything like the last few days  Since Christmas, He is also having some chest congestion and shortness of breath. Had a lot of nose running and sneezing- actually wondered if had COVID but never tested (wife also ill but his worse).   He actually feels like his shortness of breath with exertion has improved in recent days so declined emergency department visit as recommended per triage.  Symptom(s) were worse of note  Also reports a fair amount of fatigue. Has stopped working outside mostly and feels drowsy. No chest pain. Has felt more cold as well.   He started new medication in December per phone call but On my view only medication we started was telmisartan  on 01/02/2024 for microalbuminuria.  He does have CKD stage IIIb. He thinks fatigue and some congestion may date back to early November after starting medicine.   He reports off remicade  - last dose 7-8 weeks ago for dental work but later decided not of the get dental work done  Known thrombocytosis- checking CBC today- they said if over 1000 could refer back to [facility]  On echocardiogram 08/14/23 normal EF 60% with some mitral prolapse as well as moderate mitral regurgitation but stable  No orthopnea but does cough  with laying down A/P: Patient with lingering chest congestion and cough that he feels started around the time he started telmisartan  in late October or early November.  Lungs are clear today.  We opted to get chest x-ray.  No chest pain reported though he does have known CAD history this does not feel typical for him  He has also had some shortness of breath with this I think chest x-ray is reasonable.  Also some mild edema over time but feels acutely worse in last several days-check BNP for heart failure though doubt with recent reassuring echocardiogram earlier in 2025, check CBC for anemia, check CMP for liver dysfunction, check  urinalysis for protein, recheck microalbumin creatinine ratio to see if improved on telmisartan  but also considering stopping telmisartan  since he feels his symptoms started after this -We are going to reduce amlodipine  to 5 mg to see if that helps with edema -Might try Jardiance over telmisartan  as well -Discussed if new or worsening symptoms to seek care immediately.  He feels like his shortness of breath may have actually better in the last few days but that it has oscillated so he will keep a close eye -I do not think it would explain other symptoms but B12 could cause fatigue issues if low as she has been low in the past but did not tolerate treatment with hallucinations reported so we did not recheck today  Possible his hands could be psoriatic arthritis but is still within his normal regimen for Remicade  so do not strongly suspect-could be from e  # CKD stage III-make sure not worsening on labs today-continue current medication for now-also ordered the hyperkalemia risk with telmisartan  so may be another reason to stop  # Psoriatic arthritis-follows with rheumatology Dr. Mai # Immunodeficiency S: Medication: Remicade  every 8 weeks-plan was for this to be on hold for dental work through rheumatology set up at about week 7 right now, tramadol  helps with pain -Also  followed by [facility] infectious disease for immunodeficiency on Bactrim 3 days a week and as needed clindamycin as well as Diflucan A/P: Possible flare of psoriatic arthritis with joint pain but typically very well maintained on Remicade  and not outside of his 8-week regimen right now-defer decision on restarting Remicade  to rheumatology but we will also work things up from our side  # CAD-follows with cardiology Dr. Debera #hyperlipidemia S: Medication:Aspirin 81 mg simvastatin  20 mg, nitroglycerin  on hand but has not had to use Symptoms: Some shortness of breath but no chest pain with shortness of breath oscillates Lab Results  Component Value Date   CHOL 78 01/02/2024   HDL 27.30 (L) 01/02/2024   LDLCALC 26 01/02/2024   TRIG 122.0 01/02/2024   CHOLHDL 3 01/02/2024    A/P: CAD asymptomatic is far as chest pain-I do not think his shortness of breath is cardiac but we may need to investigate further if no other cause found Hyperlipidemia associate with diabetes controlled with simvastatin  20 mg with LDL under 70-continue current medication  # Diabetes- a1c goal 7.5 or even 8 S: Medication: glimepiride  4 mg, januvia  50 mg, metformin  1000mg  ER in AM CBGs- no lows, 104 this morning  Lab Results  Component Value Date   HGBA1C 7.3 (A) 01/02/2024   HGBA1C 7.0 (A) 03/14/2023   HGBA1C 6.9 (H) 11/12/2022    A/P: Diabetes reasonably well-controlled-I do not think this is contributing to his fatigue or weakness   #hypertension # Chronic kidney disease stage IIIb  with history of hyperkalemia S: medication: Amlodipine  10 mg, metoprolol  25 mg twice daily, telmisartan  20 mg -GFR typically in 30s or 40s -Has not tolerated ACE inhibitor in the past with hyperkalemia issues but back on right now due to microalbuminuria but see discussion above  A/P: I think his blood pressureto reduce amlodipine  which may help decrease edema-decrease to 5 mg and continue other medicines for now  Recommended follow up:  I asked him to schedule in February 3 in 4 PM slot-appears he chose a later date Future Appointments  Date Time Provider Department Center  05/07/2024  9:20 AM Katrinka Garnette KIDD, MD [facility]  01/07/2025  8:20 AM Katrinka Garnette  MALVA, MD [facility]  02/14/2025  8:00 AM [facility] ANNUAL WELLNESS VISIT 1 [facility]    Lab/Order associations:   ICD-10-CM   1. SOB (shortness of breath)  R06.02 DG Chest 2 View    2. Chest congestion  R09.89 DG Chest 2 View    3. Edema, unspecified type  R60.9 Comprehensive metabolic panel with GFR    CBC with Differential/Platelet    TSH    Urinalysis, Routine w reflex microscopic    Brain natriuretic peptide    4. Microalbuminuria  R80.9 Microalbumin / creatinine urine ratio    5. Essential hypertension  I10 Comprehensive metabolic panel with GFR    CBC with Differential/Platelet    TSH    6. Psoriatic arthritis (HCC)  L40.50     7. Hyperlipidemia associated with type 2 diabetes mellitus (HCC)  E11.69    E78.5     8. Stage 3 chronic kidney disease, unspecified whether stage 3a or 3b CKD (HCC)  N18.30       Meds ordered this encounter  Medications   amLODipine  (NORVASC ) 5 MG tablet    Sig: Take 1 tablet (5 mg total) by mouth daily.    Dispense:  90 tablet    Refill:  3   I personally spent a total of 43 minutes in the care of the patient today including preparing to see the patient, getting/reviewing separately obtained history, performing a medically appropriate exam/evaluation, counseling and educating, placing orders, and documenting clinical information in the EHR.  Return precautions advised.  Garnette Lukes, MD  "

## 2024-03-31 ENCOUNTER — Ambulatory Visit: Payer: Self-pay | Admitting: Family Medicine

## 2024-03-31 ENCOUNTER — Telehealth: Payer: Self-pay

## 2024-03-31 LAB — MICROALBUMIN / CREATININE URINE RATIO
Creatinine,U: 68.7 mg/dL
Microalb Creat Ratio: 20.4 mg/g (ref 0.0–30.0)
Microalb, Ur: 1.4 mg/dL (ref 0.7–1.9)

## 2024-03-31 LAB — COMPREHENSIVE METABOLIC PANEL WITH GFR
ALT: 14 U/L (ref 3–53)
AST: 20 U/L (ref 5–37)
Albumin: 4.4 g/dL (ref 3.5–5.2)
Alkaline Phosphatase: 40 U/L (ref 39–117)
BUN: 29 mg/dL — ABNORMAL HIGH (ref 6–23)
CO2: 26 meq/L (ref 19–32)
Calcium: 9.8 mg/dL (ref 8.4–10.5)
Chloride: 103 meq/L (ref 96–112)
Creatinine, Ser: 1.93 mg/dL — ABNORMAL HIGH (ref 0.40–1.50)
GFR: 32.61 mL/min — ABNORMAL LOW
Glucose, Bld: 121 mg/dL — ABNORMAL HIGH (ref 70–99)
Potassium: 5.5 meq/L — ABNORMAL HIGH (ref 3.5–5.1)
Sodium: 136 meq/L (ref 135–145)
Total Bilirubin: 0.5 mg/dL (ref 0.2–1.2)
Total Protein: 6.9 g/dL (ref 6.0–8.3)

## 2024-03-31 LAB — CBC WITH DIFFERENTIAL/PLATELET
Basophils Absolute: 0.1 K/uL (ref 0.0–0.1)
Basophils Relative: 1.3 % (ref 0.0–3.0)
Eosinophils Absolute: 0.5 K/uL (ref 0.0–0.7)
Eosinophils Relative: 5.9 % — ABNORMAL HIGH (ref 0.0–5.0)
HCT: 42.9 % (ref 39.0–52.0)
Hemoglobin: 14.3 g/dL (ref 13.0–17.0)
Lymphocytes Relative: 11.3 % — ABNORMAL LOW (ref 12.0–46.0)
Lymphs Abs: 1 K/uL (ref 0.7–4.0)
MCHC: 33.4 g/dL (ref 30.0–36.0)
MCV: 85.4 fl (ref 78.0–100.0)
Monocytes Absolute: 0.9 K/uL (ref 0.1–1.0)
Monocytes Relative: 10.4 % (ref 3.0–12.0)
Neutro Abs: 6.4 K/uL (ref 1.4–7.7)
Neutrophils Relative %: 71.1 % (ref 43.0–77.0)
Platelets: 1040 K/uL (ref 150.0–400.0)
RBC: 5.03 Mil/uL (ref 4.22–5.81)
RDW: 16 % — ABNORMAL HIGH (ref 11.5–15.5)
WBC: 9.1 K/uL (ref 4.0–10.5)

## 2024-03-31 LAB — URINALYSIS, ROUTINE W REFLEX MICROSCOPIC
Bilirubin Urine: NEGATIVE
Hgb urine dipstick: NEGATIVE
Ketones, ur: NEGATIVE
Leukocytes,Ua: NEGATIVE
Nitrite: NEGATIVE
RBC / HPF: NONE SEEN
Specific Gravity, Urine: 1.015 (ref 1.000–1.030)
Total Protein, Urine: NEGATIVE
Urine Glucose: NEGATIVE
Urobilinogen, UA: 0.2 (ref 0.0–1.0)
WBC, UA: NONE SEEN
pH: 6 (ref 5.0–8.0)

## 2024-03-31 LAB — BRAIN NATRIURETIC PEPTIDE: Pro B Natriuretic peptide (BNP): 524 pg/mL — ABNORMAL HIGH (ref 1.0–100.0)

## 2024-03-31 LAB — TSH: TSH: 2.5 u[IU]/mL (ref 0.35–5.50)

## 2024-03-31 NOTE — Telephone Encounter (Signed)
 known thrombocytosis- we will serially check in future as [facility] wants him to come back for repat eval if persistently over 1000

## 2024-03-31 NOTE — Telephone Encounter (Signed)
 CRITICAL VALUE STICKER  CRITICAL VALUE: Platelet count- Over a Million (1040)  RECEIVER (on-site recipient of call): Tawni Bertrand  DATE & TIME NOTIFIED: 03/31/2024@ 1153  MESSENGER (representative from lab): [facility]   MD NOTIFIED: Dr. Katrinka  TIME OF NOTIFICATION: 1157  RESPONSE:  Waiting to hear back. Message given to his nurse.

## 2024-04-01 ENCOUNTER — Other Ambulatory Visit: Payer: Self-pay | Admitting: Family Medicine

## 2024-04-01 MED ORDER — FUROSEMIDE 20 MG PO TABS: 20.0000 mg | ORAL_TABLET | Freq: Every day | ORAL | 1 refills | Status: AC | PRN

## 2024-04-01 NOTE — Progress Notes (Signed)
 See note in messages.  Reviewed labs/xray and d/w pt.

## 2024-04-08 NOTE — Progress Notes (Signed)
 Spoke with patients wife and he has not had to take the lasix . He is feeling better and they state he has been having a good week. Will repeat labs at follow up, no further questions.

## 2024-04-13 ENCOUNTER — Ambulatory Visit: Admitting: Family Medicine

## 2024-04-13 ENCOUNTER — Encounter: Payer: Self-pay | Admitting: Family Medicine

## 2024-04-13 VITALS — BP 120/68 | HR 85 | Temp 97.9°F | Ht 71.0 in | Wt 183.0 lb

## 2024-04-13 DIAGNOSIS — Z7984 Long term (current) use of oral hypoglycemic drugs: Secondary | ICD-10-CM

## 2024-04-13 DIAGNOSIS — R609 Edema, unspecified: Secondary | ICD-10-CM | POA: Diagnosis not present

## 2024-04-13 DIAGNOSIS — R809 Proteinuria, unspecified: Secondary | ICD-10-CM | POA: Diagnosis not present

## 2024-04-13 DIAGNOSIS — I10 Essential (primary) hypertension: Secondary | ICD-10-CM

## 2024-04-13 DIAGNOSIS — E1139 Type 2 diabetes mellitus with other diabetic ophthalmic complication: Secondary | ICD-10-CM

## 2024-04-13 MED ORDER — EMPAGLIFLOZIN 10 MG PO TABS: 10.0000 mg | ORAL_TABLET | Freq: Every day | ORAL | 11 refills | Status: AC

## 2024-04-13 NOTE — Patient Instructions (Addendum)
-  STOP glimepiride (helps diabetes ) -STOP telmisartan  (helps protein in urine)  -START Jardiance  10 mg  (helps diabetes and protein and urine, also reduces heart risk and helps with heart failure and fluid issues)  Monitor your weight, write down daily and bring log with you- if weight up 3 lbs in a day or 5 lbs in 3 days let me know or if swelling worsens  Please stop by lab before you go If you have mychart- we will send your results within 3 business days of us  receiving them.  If you do not have mychart- we will call you about results within 5 business days of us  receiving them.  *please also note that you will see labs on mychart as soon as they post. I will later go in and write notes on them- will say notes from Dr. Katrinka   Recommended follow up: Return for next already scheduled visit or sooner if needed.

## 2024-04-14 ENCOUNTER — Other Ambulatory Visit: Payer: Self-pay | Admitting: Family Medicine

## 2024-04-14 ENCOUNTER — Ambulatory Visit: Payer: Self-pay | Admitting: Family Medicine

## 2024-04-14 DIAGNOSIS — E875 Hyperkalemia: Secondary | ICD-10-CM

## 2024-04-14 LAB — COMPREHENSIVE METABOLIC PANEL WITH GFR
AG Ratio: 2.1 (calc) (ref 1.0–2.5)
ALT: 20 U/L (ref 9–46)
AST: 19 U/L (ref 10–35)
Albumin: 4.5 g/dL (ref 3.6–5.1)
Alkaline phosphatase (APISO): 51 U/L (ref 35–144)
BUN/Creatinine Ratio: 11 (calc) (ref 6–22)
BUN: 23 mg/dL (ref 7–25)
CO2: 25 mmol/L (ref 20–32)
Calcium: 9.7 mg/dL (ref 8.6–10.3)
Chloride: 102 mmol/L (ref 98–110)
Creat: 2.15 mg/dL — ABNORMAL HIGH (ref 0.70–1.28)
Globulin: 2.1 g/dL (ref 1.9–3.7)
Glucose, Bld: 165 mg/dL — ABNORMAL HIGH (ref 65–99)
Potassium: 5.8 mmol/L — ABNORMAL HIGH (ref 3.5–5.3)
Sodium: 135 mmol/L (ref 135–146)
Total Bilirubin: 0.5 mg/dL (ref 0.2–1.2)
Total Protein: 6.6 g/dL (ref 6.1–8.1)
eGFR: 31 mL/min/{1.73_m2} — ABNORMAL LOW

## 2024-04-14 LAB — CBC WITH DIFFERENTIAL/PLATELET
Absolute Lymphocytes: 988 {cells}/uL (ref 850–3900)
Absolute Monocytes: 806 {cells}/uL (ref 200–950)
Basophils Absolute: 103 {cells}/uL (ref 0–200)
Basophils Relative: 1.3 %
Eosinophils Absolute: 506 {cells}/uL — ABNORMAL HIGH (ref 15–500)
Eosinophils Relative: 6.4 %
HCT: 45 % (ref 39.4–51.1)
Hemoglobin: 14.6 g/dL (ref 13.2–17.1)
MCH: 28.1 pg (ref 27.0–33.0)
MCHC: 32.4 g/dL (ref 31.6–35.4)
MCV: 86.5 fL (ref 81.4–101.7)
MPV: 10.9 fL (ref 7.5–12.5)
Monocytes Relative: 10.2 %
Neutro Abs: 5498 {cells}/uL (ref 1500–7800)
Neutrophils Relative %: 69.6 %
Platelets: 994 10*3/uL — ABNORMAL HIGH (ref 140–400)
RBC: 5.2 Million/uL (ref 4.20–5.80)
RDW: 14 % (ref 11.0–15.0)
Total Lymphocyte: 12.5 %
WBC: 7.9 10*3/uL (ref 3.8–10.8)

## 2024-04-14 LAB — HEMOGLOBIN A1C
Hgb A1c MFr Bld: 7.8 % — ABNORMAL HIGH
Mean Plasma Glucose: 177 mg/dL
eAG (mmol/L): 9.8 mmol/L

## 2024-04-16 ENCOUNTER — Ambulatory Visit: Payer: Self-pay | Admitting: Family Medicine

## 2024-04-16 ENCOUNTER — Other Ambulatory Visit

## 2024-04-16 DIAGNOSIS — E875 Hyperkalemia: Secondary | ICD-10-CM

## 2024-04-16 LAB — BASIC METABOLIC PANEL WITH GFR
BUN: 29 mg/dL — ABNORMAL HIGH (ref 6–23)
CO2: 25 meq/L (ref 19–32)
Calcium: 9.6 mg/dL (ref 8.4–10.5)
Chloride: 99 meq/L (ref 96–112)
Creatinine, Ser: 2.17 mg/dL — ABNORMAL HIGH (ref 0.40–1.50)
GFR: 28.32 mL/min — ABNORMAL LOW
Glucose, Bld: 185 mg/dL — ABNORMAL HIGH (ref 70–99)
Potassium: 5 meq/L (ref 3.5–5.1)
Sodium: 133 meq/L — ABNORMAL LOW (ref 135–145)

## 2024-05-07 ENCOUNTER — Ambulatory Visit: Admitting: Family Medicine

## 2025-01-07 ENCOUNTER — Encounter: Admitting: Family Medicine

## 2025-02-14 ENCOUNTER — Ambulatory Visit
